# Patient Record
Sex: Male | Born: 1955 | Race: White | Hispanic: No | Marital: Married | Smoking: Current every day smoker
Health system: Southern US, Community
[De-identification: ages and names within clinical notes are randomized; demographics above are authoritative.]

## PROBLEM LIST (undated history)

## (undated) DIAGNOSIS — Z8709 Personal history of other diseases of the respiratory system: Secondary | ICD-10-CM

## (undated) DIAGNOSIS — I10 Essential (primary) hypertension: Secondary | ICD-10-CM

## (undated) DIAGNOSIS — Z8719 Personal history of other diseases of the digestive system: Secondary | ICD-10-CM

## (undated) DIAGNOSIS — F419 Anxiety disorder, unspecified: Secondary | ICD-10-CM

## (undated) DIAGNOSIS — I2510 Atherosclerotic heart disease of native coronary artery without angina pectoris: Secondary | ICD-10-CM

## (undated) DIAGNOSIS — M1990 Unspecified osteoarthritis, unspecified site: Secondary | ICD-10-CM

## (undated) DIAGNOSIS — F32A Depression, unspecified: Secondary | ICD-10-CM

## (undated) DIAGNOSIS — E782 Mixed hyperlipidemia: Secondary | ICD-10-CM

## (undated) DIAGNOSIS — F329 Major depressive disorder, single episode, unspecified: Secondary | ICD-10-CM

## (undated) DIAGNOSIS — G894 Chronic pain syndrome: Secondary | ICD-10-CM

## (undated) DIAGNOSIS — M2550 Pain in unspecified joint: Secondary | ICD-10-CM

## (undated) DIAGNOSIS — K219 Gastro-esophageal reflux disease without esophagitis: Secondary | ICD-10-CM

## (undated) HISTORY — DX: Atherosclerotic heart disease of native coronary artery without angina pectoris: I25.10

## (undated) HISTORY — PX: APPENDECTOMY: SHX54

## (undated) HISTORY — PX: CERVICAL SPINE SURGERY: SHX589

## (undated) HISTORY — DX: Depression, unspecified: F32.A

## (undated) HISTORY — DX: Chronic pain syndrome: G89.4

## (undated) HISTORY — DX: Mixed hyperlipidemia: E78.2

## (undated) HISTORY — DX: Essential (primary) hypertension: I10

## (undated) HISTORY — PX: ELBOW SURGERY: SHX618

## (undated) HISTORY — PX: HAND SURGERY: SHX662

## (undated) HISTORY — PX: CARPAL TUNNEL RELEASE: SHX101

## (undated) HISTORY — DX: Gastro-esophageal reflux disease without esophagitis: K21.9

## (undated) HISTORY — DX: Major depressive disorder, single episode, unspecified: F32.9

## (undated) HISTORY — PX: COLONOSCOPY: SHX174

## (undated) HISTORY — PX: OTHER SURGICAL HISTORY: SHX169

---

## 2003-08-10 ENCOUNTER — Ambulatory Visit (HOSPITAL_COMMUNITY): Admission: RE | Admit: 2003-08-10 | Discharge: 2003-08-10 | Payer: Self-pay | Admitting: Family Medicine

## 2003-08-10 ENCOUNTER — Encounter: Payer: Self-pay | Admitting: Family Medicine

## 2004-06-18 ENCOUNTER — Ambulatory Visit (HOSPITAL_COMMUNITY): Admission: RE | Admit: 2004-06-18 | Discharge: 2004-06-18 | Payer: Self-pay | Admitting: Orthopedic Surgery

## 2004-07-03 ENCOUNTER — Ambulatory Visit (HOSPITAL_COMMUNITY): Admission: RE | Admit: 2004-07-03 | Discharge: 2004-07-03 | Payer: Self-pay | Admitting: Neurosurgery

## 2004-10-09 ENCOUNTER — Ambulatory Visit: Payer: Self-pay | Admitting: Family Medicine

## 2004-10-10 ENCOUNTER — Inpatient Hospital Stay (HOSPITAL_COMMUNITY): Admission: RE | Admit: 2004-10-10 | Discharge: 2004-10-12 | Payer: Self-pay | Admitting: Orthopaedic Surgery

## 2005-07-22 ENCOUNTER — Ambulatory Visit: Payer: Self-pay | Admitting: *Deleted

## 2005-07-22 ENCOUNTER — Ambulatory Visit: Payer: Self-pay | Admitting: Family Medicine

## 2005-07-22 ENCOUNTER — Inpatient Hospital Stay (HOSPITAL_COMMUNITY): Admission: EM | Admit: 2005-07-22 | Discharge: 2005-07-25 | Payer: Self-pay | Admitting: Emergency Medicine

## 2005-07-23 ENCOUNTER — Encounter: Payer: Self-pay | Admitting: Cardiology

## 2005-07-25 ENCOUNTER — Encounter: Payer: Self-pay | Admitting: Cardiology

## 2005-08-11 ENCOUNTER — Ambulatory Visit: Payer: Self-pay | Admitting: Family Medicine

## 2005-09-25 ENCOUNTER — Ambulatory Visit: Payer: Self-pay | Admitting: Family Medicine

## 2007-02-02 ENCOUNTER — Ambulatory Visit: Payer: Self-pay | Admitting: Family Medicine

## 2007-02-09 ENCOUNTER — Ambulatory Visit: Payer: Self-pay | Admitting: Family Medicine

## 2008-05-17 ENCOUNTER — Encounter: Payer: Self-pay | Admitting: Cardiology

## 2008-08-08 ENCOUNTER — Encounter: Payer: Self-pay | Admitting: Cardiology

## 2008-08-10 ENCOUNTER — Encounter: Payer: Self-pay | Admitting: Cardiology

## 2009-08-06 ENCOUNTER — Encounter: Payer: Self-pay | Admitting: Cardiology

## 2009-08-07 ENCOUNTER — Encounter: Payer: Self-pay | Admitting: Cardiology

## 2009-09-25 ENCOUNTER — Encounter: Payer: Self-pay | Admitting: Cardiology

## 2009-10-02 ENCOUNTER — Encounter: Payer: Self-pay | Admitting: Cardiology

## 2009-10-11 ENCOUNTER — Encounter: Payer: Self-pay | Admitting: Cardiology

## 2009-10-11 DIAGNOSIS — F329 Major depressive disorder, single episode, unspecified: Secondary | ICD-10-CM

## 2009-10-11 DIAGNOSIS — I10 Essential (primary) hypertension: Secondary | ICD-10-CM | POA: Insufficient documentation

## 2009-10-11 DIAGNOSIS — R5381 Other malaise: Secondary | ICD-10-CM

## 2009-10-11 DIAGNOSIS — R5383 Other fatigue: Secondary | ICD-10-CM

## 2009-10-11 DIAGNOSIS — E669 Obesity, unspecified: Secondary | ICD-10-CM | POA: Insufficient documentation

## 2009-10-11 DIAGNOSIS — R0609 Other forms of dyspnea: Secondary | ICD-10-CM | POA: Insufficient documentation

## 2009-10-11 DIAGNOSIS — R0602 Shortness of breath: Secondary | ICD-10-CM

## 2009-10-11 DIAGNOSIS — R079 Chest pain, unspecified: Secondary | ICD-10-CM

## 2009-10-11 DIAGNOSIS — G894 Chronic pain syndrome: Secondary | ICD-10-CM

## 2009-10-11 DIAGNOSIS — K219 Gastro-esophageal reflux disease without esophagitis: Secondary | ICD-10-CM

## 2009-10-11 DIAGNOSIS — E785 Hyperlipidemia, unspecified: Secondary | ICD-10-CM

## 2009-10-15 ENCOUNTER — Ambulatory Visit: Payer: Self-pay | Admitting: Cardiology

## 2009-10-15 ENCOUNTER — Encounter (INDEPENDENT_AMBULATORY_CARE_PROVIDER_SITE_OTHER): Payer: Self-pay | Admitting: *Deleted

## 2009-10-15 DIAGNOSIS — F17200 Nicotine dependence, unspecified, uncomplicated: Secondary | ICD-10-CM

## 2009-10-18 ENCOUNTER — Ambulatory Visit: Payer: Self-pay | Admitting: Cardiology

## 2009-10-18 ENCOUNTER — Encounter: Payer: Self-pay | Admitting: Cardiology

## 2009-11-20 ENCOUNTER — Encounter: Payer: Self-pay | Admitting: Cardiology

## 2009-12-01 HISTORY — PX: CORONARY ARTERY BYPASS GRAFT: SHX141

## 2009-12-13 ENCOUNTER — Encounter (INDEPENDENT_AMBULATORY_CARE_PROVIDER_SITE_OTHER): Payer: Self-pay | Admitting: *Deleted

## 2009-12-13 ENCOUNTER — Encounter: Payer: Self-pay | Admitting: Physician Assistant

## 2009-12-13 ENCOUNTER — Ambulatory Visit: Payer: Self-pay | Admitting: Cardiology

## 2009-12-14 ENCOUNTER — Encounter (INDEPENDENT_AMBULATORY_CARE_PROVIDER_SITE_OTHER): Payer: Self-pay | Admitting: *Deleted

## 2009-12-17 ENCOUNTER — Ambulatory Visit: Payer: Self-pay | Admitting: Cardiology

## 2009-12-17 ENCOUNTER — Inpatient Hospital Stay (HOSPITAL_BASED_OUTPATIENT_CLINIC_OR_DEPARTMENT_OTHER): Admission: RE | Admit: 2009-12-17 | Discharge: 2009-12-17 | Payer: Self-pay | Admitting: Cardiology

## 2009-12-18 ENCOUNTER — Telehealth (INDEPENDENT_AMBULATORY_CARE_PROVIDER_SITE_OTHER): Payer: Self-pay | Admitting: *Deleted

## 2009-12-18 ENCOUNTER — Ambulatory Visit: Payer: Self-pay | Admitting: Surgery

## 2009-12-18 ENCOUNTER — Encounter: Payer: Self-pay | Admitting: Cardiology

## 2009-12-20 ENCOUNTER — Encounter: Payer: Self-pay | Admitting: Surgery

## 2009-12-20 ENCOUNTER — Encounter: Payer: Self-pay | Admitting: Cardiology

## 2009-12-24 ENCOUNTER — Inpatient Hospital Stay (HOSPITAL_COMMUNITY): Admission: RE | Admit: 2009-12-24 | Discharge: 2009-12-28 | Payer: Self-pay | Admitting: Surgery

## 2009-12-24 ENCOUNTER — Ambulatory Visit: Payer: Self-pay | Admitting: Surgery

## 2009-12-24 ENCOUNTER — Encounter: Payer: Self-pay | Admitting: Cardiology

## 2009-12-24 DIAGNOSIS — Z951 Presence of aortocoronary bypass graft: Secondary | ICD-10-CM

## 2009-12-25 ENCOUNTER — Encounter: Payer: Self-pay | Admitting: Cardiology

## 2009-12-26 ENCOUNTER — Encounter: Payer: Self-pay | Admitting: Cardiology

## 2009-12-31 ENCOUNTER — Telehealth: Payer: Self-pay | Admitting: Cardiology

## 2010-01-11 ENCOUNTER — Encounter: Payer: Self-pay | Admitting: Physician Assistant

## 2010-01-15 ENCOUNTER — Ambulatory Visit: Payer: Self-pay | Admitting: Cardiology

## 2010-01-15 DIAGNOSIS — M7989 Other specified soft tissue disorders: Secondary | ICD-10-CM

## 2010-01-17 ENCOUNTER — Telehealth (INDEPENDENT_AMBULATORY_CARE_PROVIDER_SITE_OTHER): Payer: Self-pay | Admitting: *Deleted

## 2010-01-18 ENCOUNTER — Telehealth (INDEPENDENT_AMBULATORY_CARE_PROVIDER_SITE_OTHER): Payer: Self-pay | Admitting: *Deleted

## 2010-01-18 ENCOUNTER — Encounter: Payer: Self-pay | Admitting: Cardiology

## 2010-01-22 ENCOUNTER — Ambulatory Visit: Payer: Self-pay | Admitting: Surgery

## 2010-01-22 ENCOUNTER — Encounter: Admission: RE | Admit: 2010-01-22 | Discharge: 2010-01-22 | Payer: Self-pay | Admitting: Surgery

## 2010-01-23 ENCOUNTER — Telehealth (INDEPENDENT_AMBULATORY_CARE_PROVIDER_SITE_OTHER): Payer: Self-pay | Admitting: *Deleted

## 2010-01-23 ENCOUNTER — Encounter: Payer: Self-pay | Admitting: Surgery

## 2010-01-23 ENCOUNTER — Encounter: Payer: Self-pay | Admitting: Cardiology

## 2010-01-24 ENCOUNTER — Encounter: Payer: Self-pay | Admitting: Cardiology

## 2010-02-19 ENCOUNTER — Telehealth (INDEPENDENT_AMBULATORY_CARE_PROVIDER_SITE_OTHER): Payer: Self-pay | Admitting: *Deleted

## 2010-02-21 ENCOUNTER — Encounter: Payer: Self-pay | Admitting: Cardiology

## 2010-03-19 ENCOUNTER — Ambulatory Visit: Payer: Self-pay | Admitting: Cardiology

## 2010-03-19 DIAGNOSIS — Z87448 Personal history of other diseases of urinary system: Secondary | ICD-10-CM

## 2010-04-25 ENCOUNTER — Encounter: Payer: Self-pay | Admitting: Cardiology

## 2010-06-25 ENCOUNTER — Encounter: Payer: Self-pay | Admitting: Cardiology

## 2010-09-03 IMAGING — CR DG CHEST 1V PORT
1 series · 1 of 1 positions shown · non-contrast
Comparison: 12/24/2009

CLINICAL DATA: Postop day one, status post CABG.  Coronary artery
disease

PORTABLE CHEST - 1 VIEW

[AP]
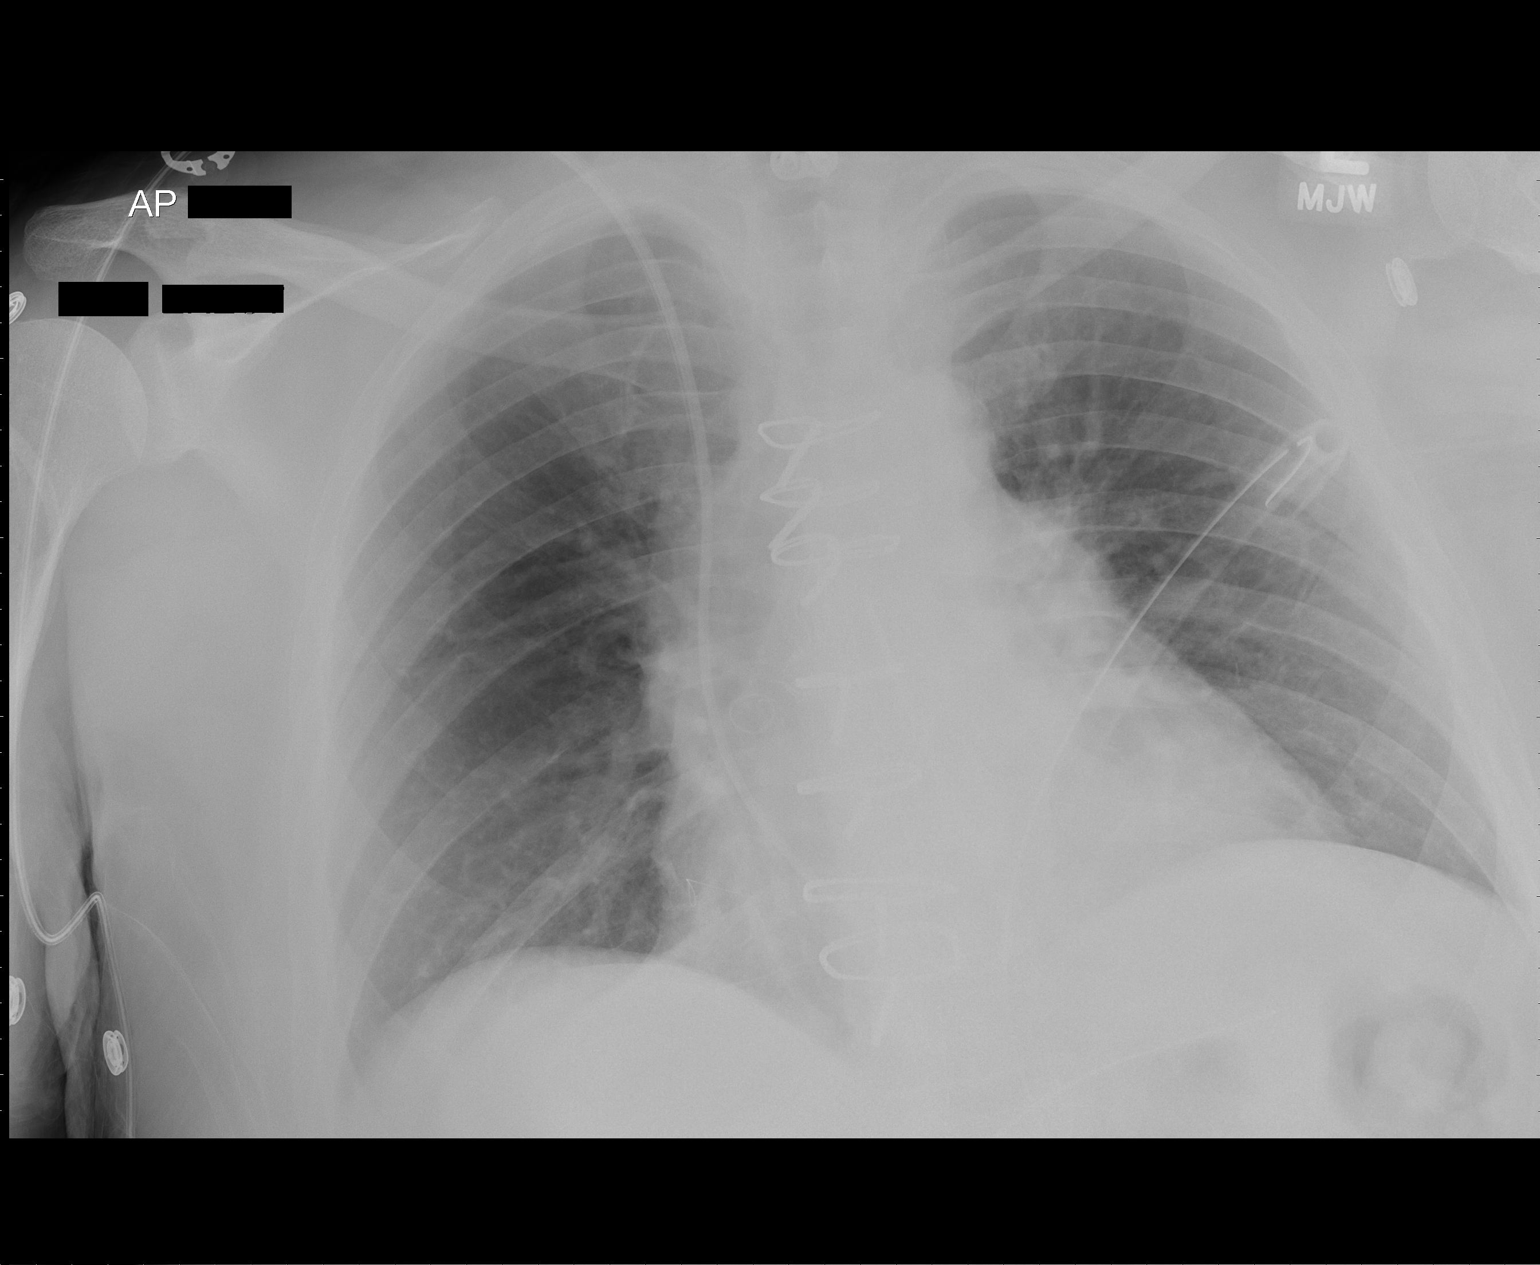

[1 of 1 positions shown; findings below may reference images not displayed]

FINDINGS: Endotracheal and nasogastric tubes have been removed.
Mediastinal drain, left-sided chest tube, and Swan-Ganz catheter
remain in place.  The Swan-Ganz catheter tip projects of the main
pulmonary artery, as before.

Borderline cardiomegaly noted.  The lungs appear clear.  Mildly low
lung volumes are present.
IMPRESSION: 1.  Postop day one status post CABG, with removal of the
nasogastric and endotracheal tubes.  No specific complicating
feature is identified.

## 2010-09-04 IMAGING — CR DG CHEST 2V
2 series · 2 of 2 positions shown · non-contrast
Comparison: Portable chest x-ray of 12/25/2009

CLINICAL DATA: Chest soreness post CABG

CHEST - 2 VIEW

[w chest pa]
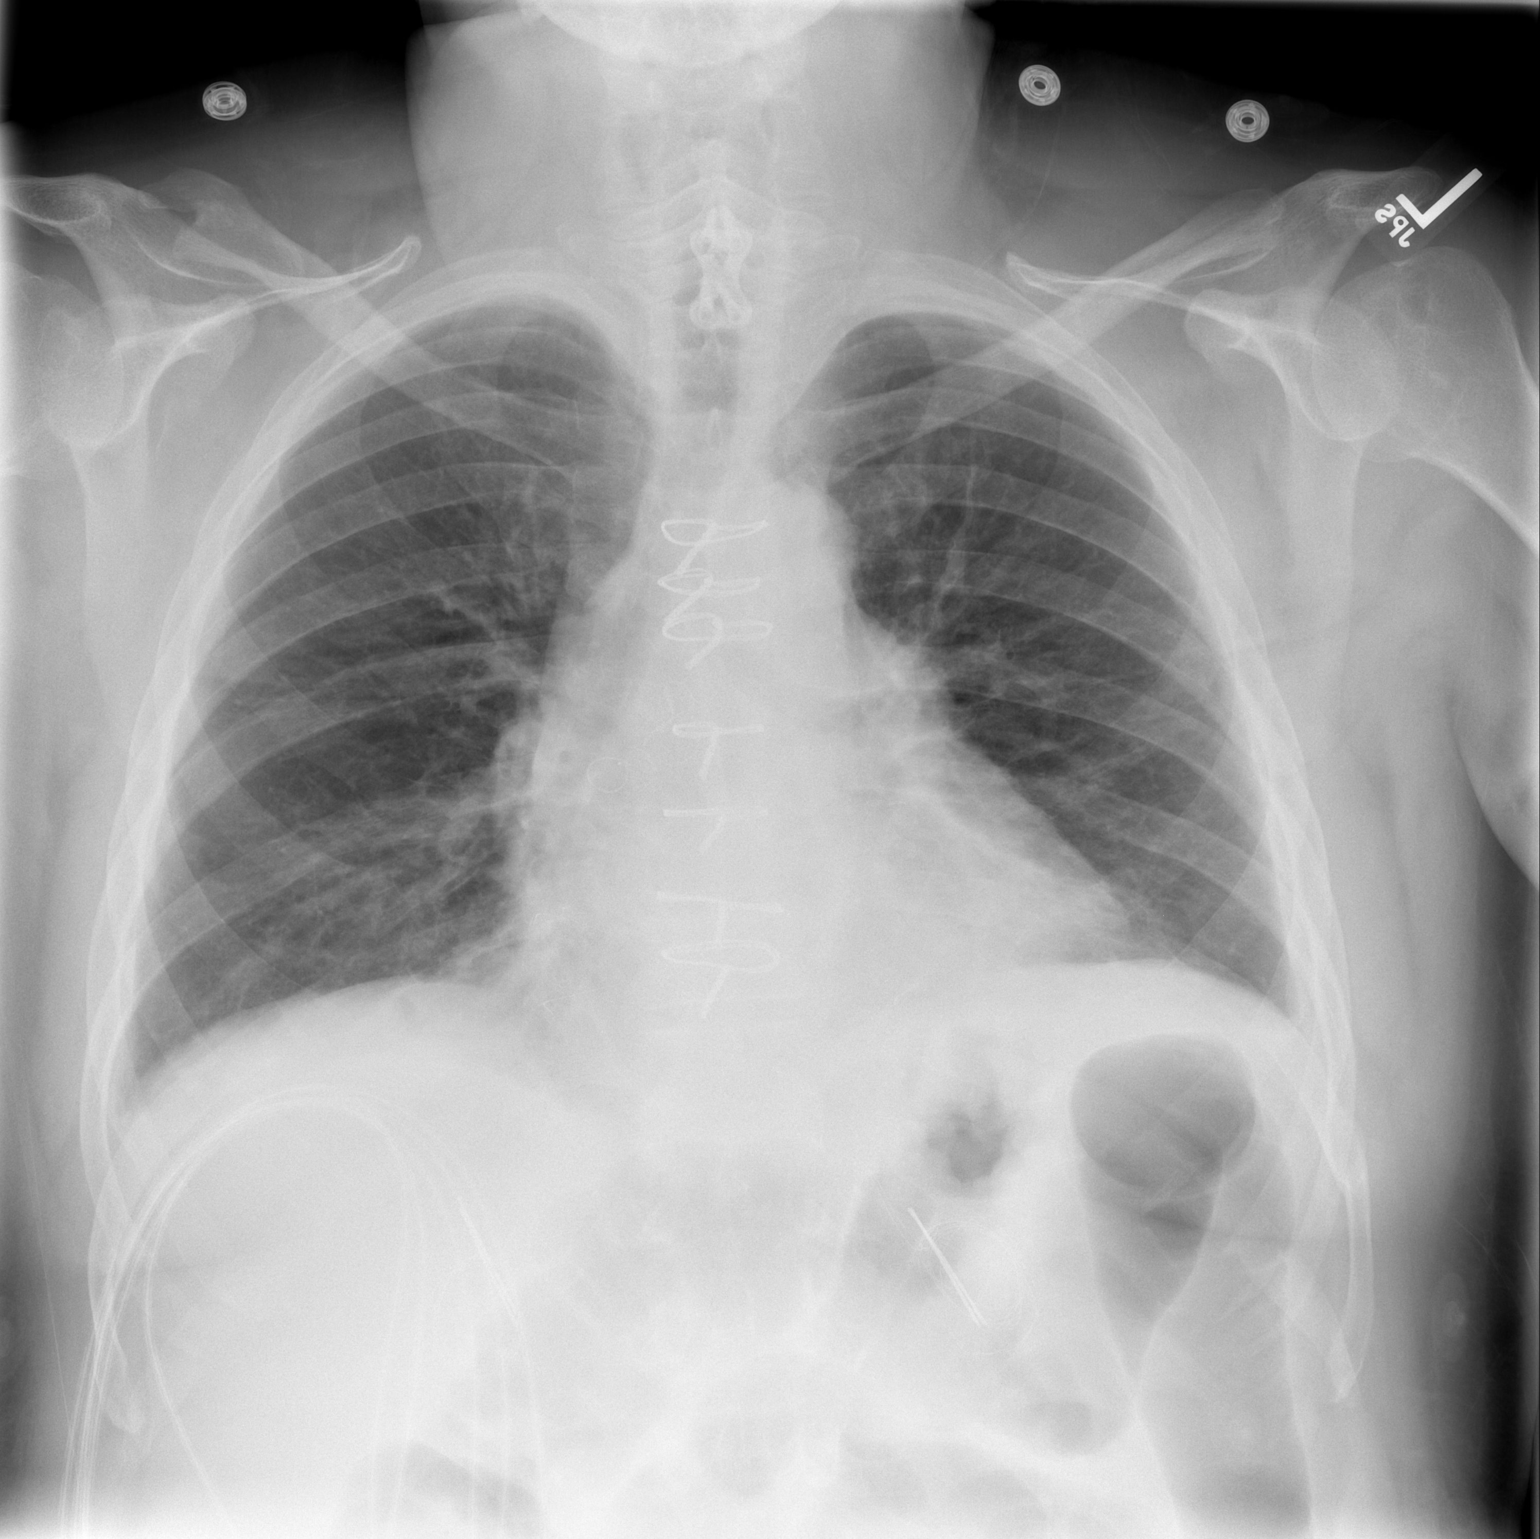

[w chest lat]
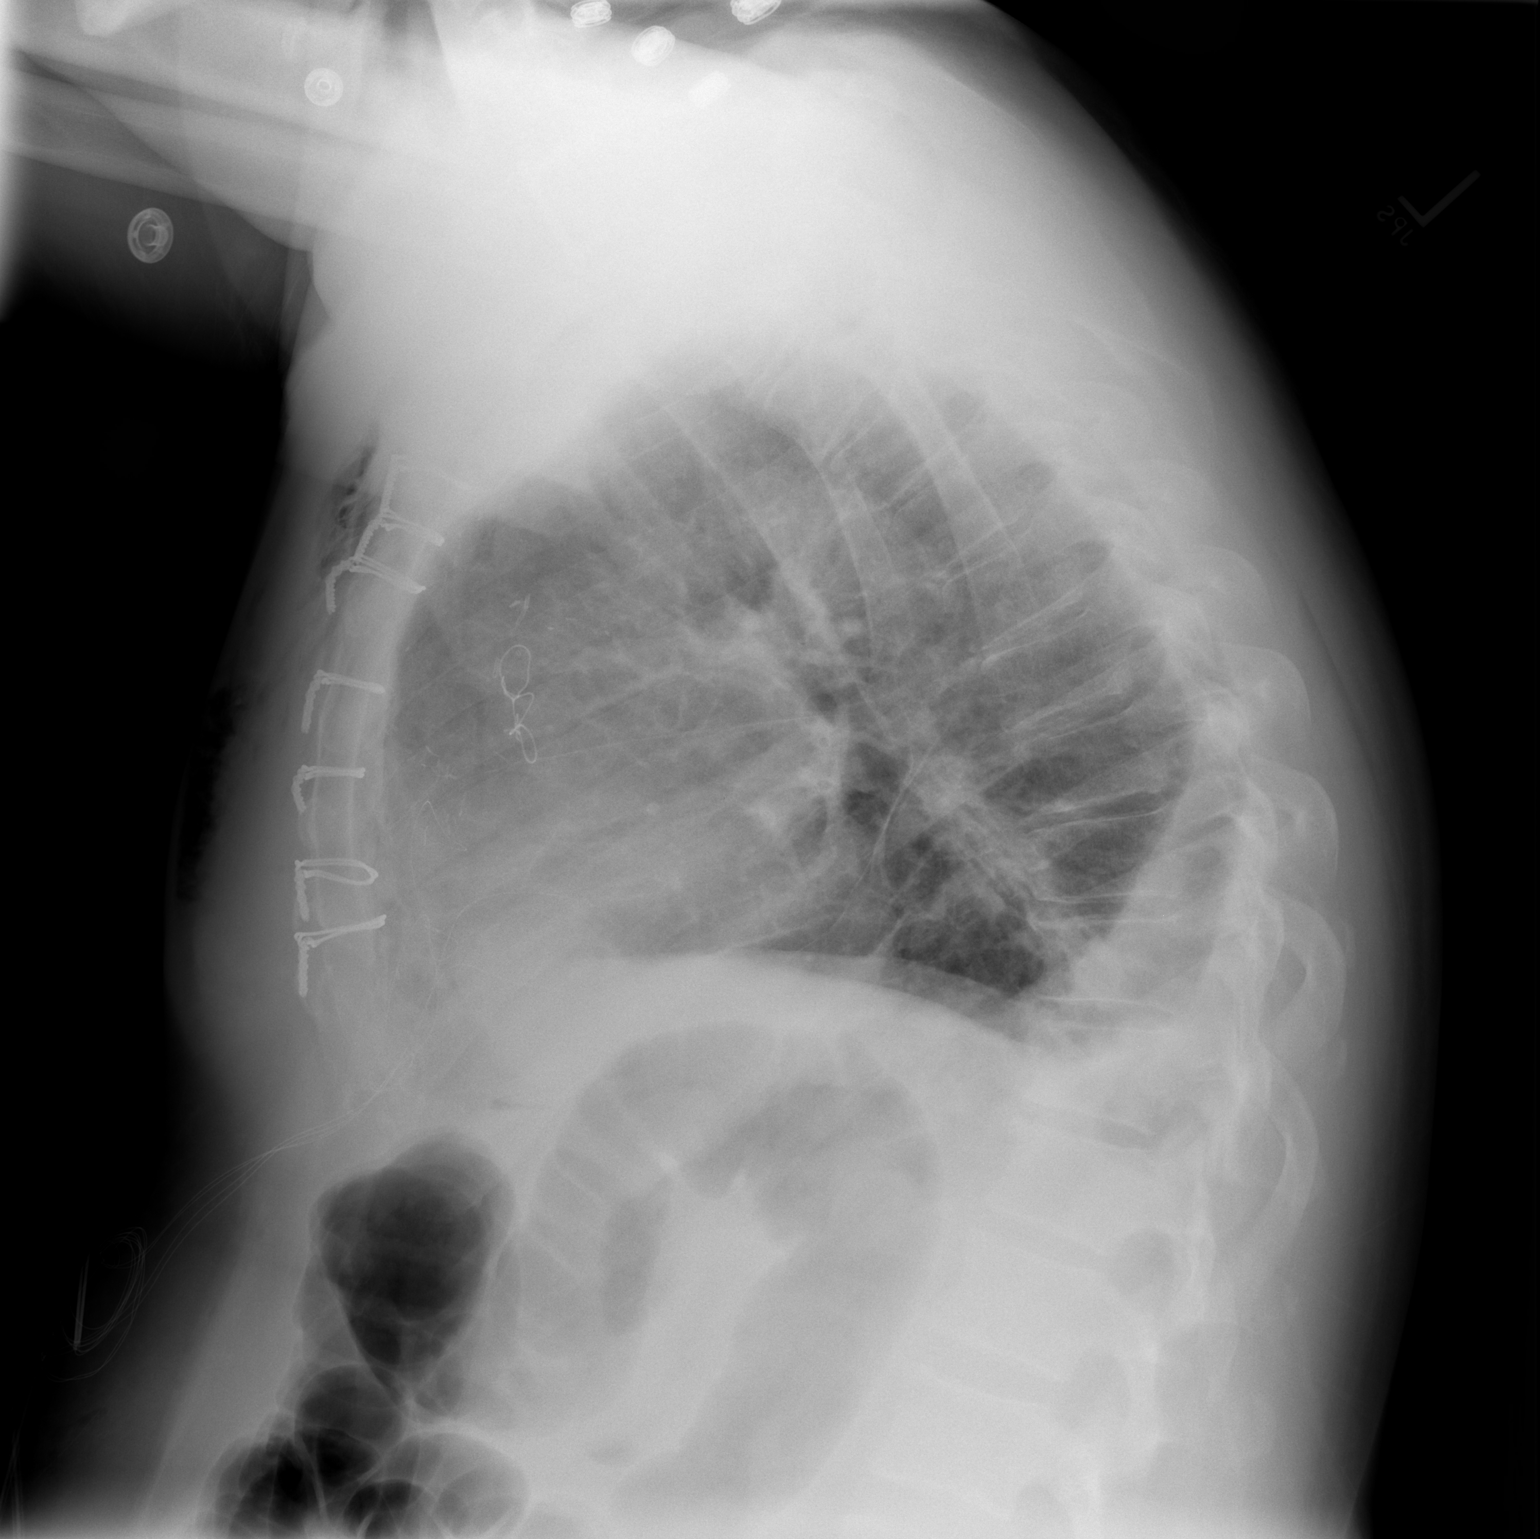

[2 of 2 positions shown; findings below may reference images not displayed]

FINDINGS: There is mild basilar atelectasis present with small
pleural effusions present.  The Swan-Ganz catheter and left chest
tube have been removed and no pneumothorax is seen.  Cardiomegaly
is stable.  Median sternotomy sutures are noted.
IMPRESSION: Left chest tube and Swan-Ganz catheter removed.  Mild basilar
atelectasis remains with small effusions.

## 2010-09-25 ENCOUNTER — Encounter: Payer: Self-pay | Admitting: Cardiology

## 2010-12-16 ENCOUNTER — Ambulatory Visit
Admission: RE | Admit: 2010-12-16 | Discharge: 2010-12-16 | Payer: Self-pay | Source: Home / Self Care | Attending: Cardiology | Admitting: Cardiology

## 2010-12-31 NOTE — Consult Note (Signed)
Summary: [facility] Cardiac Rehab Referral   [facility] Cardiac Rehab Referral   Imported By: Roderic Ovens 02/11/2010 14:33:13  _____________________________________________________________________  External Attachment:    Type:   Image     Comment:   External Document

## 2010-12-31 NOTE — Assessment & Plan Note (Signed)
Summary: 2 mo fu -srs   Visit Type:  Follow-up Primary Provider:  Art Buff, MD  CC:  CAD.  History of Present Illness: The patient returns for followup. Since I last saw him he has done relatively well. Pain that he was having in his right leg related to his endovascular harvest is improving and the swelling has gone down. He did see Dr. Laneta Simmers in followup. He is participating in cardiac rehabilitation. His blood pressure was elevated and he was recently started on hydrochlorothiazide and this is improved.  He doesn't have some atypical fleeting chest discomfort not inducible with activity.  Preventive Screening-Counseling & Management  Alcohol-Tobacco     Smoking Status: quit     Year Started: 1962     Year Quit: 12/23/2009  Current Medications (verified): 1)  Aspirin 81 Mg Tbec (Aspirin) .... Take One Tablet By Mouth Daily 2)  Prilosec 20 Mg Cpdr (Omeprazole) .... Take 1 Tablet By Mouth Two Times A Day 3)  Zymaxid 0.5 % Soln (Gatifloxacin) .... Use 1 Gtt Four Times Per Day in (L) Eye When Receiving Injections 4)  Avastin 100 Mg/56ml Soln (Bevacizumab) .... Inject in (L) Eye Once Per Month. 5)  Pravastatin Sodium 40 Mg Tabs (Pravastatin Sodium) .... Take One Tablet By Mouth Daily At Bedtime 6)  Flexeril 10 Mg Tabs (Cyclobenzaprine Hcl) .... As Needed 7)  Alprazolam 0.25 Mg Tabs (Alprazolam) .... As Needed 8)  Hydrochlorothiazide 12.5 Mg Tabs (Hydrochlorothiazide) .... Take One Tablet By Mouth Daily. 9)  Lisinopril 20 Mg Tabs (Lisinopril) .... Take One Tablet By Mouth Daily  Allergies: 1)  Codeine  Comments:  Nurse/Medical Assistant: The patient's medications were reviewed with the patient and were updated in the Medication List. Pt brought medication bottles to office visit.  Cyril Loosen, RN, BSN (March 19, 2010 11:30 AM)  Past History:  Past Medical History: Reviewed history from 01/15/2010 and no changes required. SHORTNESS OF BREATH (ICD-786.05) CHEST PAIN  UNSPECIFIED (ICD-786.50) DEPRESSION (ICD-311) GERD (ICD-530.81) OBESITY, UNSPECIFIED (ICD-278.00) OTHER MALAISE AND FATIGUE (ICD-780.79) CHRONIC PAIN SYNDROME (ICD-338.4) HYPERTENSION (ICD-401.9) (recent) HYPERLIPIDEMIA (ICD-272.4) (years) ENCOUNTER FOR LONG-TERM USE OF OTHER MEDICATIONS (ICD-V58.69) Tobacco abuse CAD  Past Surgical History: Reviewed history from 01/15/2010 and no changes required. C Spine with fusions x 3 Right shoulder surgery Left shoulder surgery Appendectomy Left and right carpel tunnel syndrome Left elbow surgery  CABG (left internal mammary artery graft to the left anterior descending coronary artery, with a saphenous vein graft to the diagonal branch of the LAD, a saphenous vein graft to the second obtuse marginal branch of the left circumflex coronary artery, and a sequential saphenous vein graft to the acute marginal and distal right coronary artery.)  Social History: Smoking Status:  quit  Review of Systems       As stated in the HPI and negative for all other systems.   Vital Signs:  Patient profile:   55 year old male Height:      70 inches Weight:      226 pounds Pulse rate:   62 / minute BP sitting:   118 / 83  (left arm) Cuff size:   large  Vitals Entered By: Cyril Loosen, RN, BSN (March 19, 2010 11:26 AM) CC: CAD Comments Follow up appt. Dr. Barbara Cower added another BP med per pt.   Physical Exam  General:  Well developed, well nourished, in no acute distress. Head:  normocephalic and atraumatic Eyes:  PERRLA/EOM intact; conjunctiva and lids normal. Mouth:  Oral mucosa normal.  Neck:  Neck supple, no JVD. No masses, thyromegaly or abnormal cervical nodes. Chest Wall:  Well-healed sternotomy scar Lungs:  Clear bilaterally to auscultation and percussion. Abdomen:  Bowel sounds positive; abdomen soft and non-tender without masses, organomegaly, or hernias noted. No hepatosplenomegaly. Msk:  Back normal, normal gait. Muscle  strength and tone normal. Extremities:  mild right lower extremity edema improved compared to previous Neurologic:  Alert and oriented x 3. Skin:  Intact without lesions or rashes. Psych:  Normal affect.   Detailed Cardiovascular Exam  Neck    Carotids: Carotids full and equal bilaterally without bruits.      Neck Veins: Normal, no JVD.    Heart    Inspection: no deformities or lifts noted.      Palpation: normal PMI with no thrills palpable.      Auscultation: regular rate and rhythm, S1, S2 without murmurs, rubs, gallops, or clicks.    Vascular    Abdominal Aorta: no palpable masses, pulsations, or audible bruits.      Femoral Pulses: normal femoral pulses bilaterally.      Radial Pulses: normal radial pulses bilaterally.      Peripheral Circulation: no clubbing, cyanosis, or edema noted with normal capillary refill.     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 No further cardiovascular testing is suggested. He will continue with risk reduction.  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  Problem # 3:  OBESITY, UNSPECIFIED (ICD-278.00) He understands the need to lose weight with diet and exercise.  Problem # 4:  HYPERLIPIDEMIA (ICD-272.4) I reviewed labs done last month with an HDL of 32 and LDL of 96. I hope that this will improve further with increased exercise but would not change his meds.  Patient Instructions: 1)  Your physician has recommended you make the following change in your medication: added viagra 50mg  to be used as needed. Your prescription has been sent to your pharmacy. 2)  Your physician wants you to follow-up in:6 months. You will receive a reminder letter in the mail about two months in advance. If you don't  receive a letter, please call our office to schedule the follow-up appointment. Prescriptions: VIAGRA 50 MG TABS (SILDENAFIL CITRATE) Take 1 tablet by mouth once a day as needed. (DON'T USE IN COMBO WITH NITROGLYCERIN)  #20 x 2   Entered by:   Carlye Grippe   Authorized by:   Rollene Rotunda, MD, [facility]   Signed by:   Carlye Grippe on 03/19/2010   Method used:   Electronically to        [facility] (retail)       [address]       [place], [place]  [address]       Ph: [phone]       Fax: [phone]   RxID:   [id number]

## 2010-12-31 NOTE — Cardiovascular Report (Signed)
Summary: Cardiac Cath Order  Cardiac Cath Order   Imported By: Cyril Loosen, RN, BSN 12/14/2009 13:07:31  _____________________________________________________________________  External Attachment:    Type:   Image     Comment:   External Document

## 2010-12-31 NOTE — Letter (Signed)
Summary: Cardiac Cath Instructions - [facility]  [facility] [address]   [place], [place] [address]   Phone: [phone]  Fax: [phone]     12/13/2009 MRN: [id number]  Brandon Pratt [address] [place], [place]  [address]  Dear [name],   You are scheduled for a Cardiac Catheterization on Monday, January 17th with Dr. Antoine Poche.  Please arrive to the 1st floor of the Heart and Vascular Center at [facility] at 9:30 am on the day of your procedure. Please do not arrive before 6:30 a.m. Call the Heart and Vascular Center at [phone] if you are unable to make your appointmnet. The Code to get into the parking garage under the building is 0090. Take the elevators to the 1st floor. You must have someone to drive you home. Someone must be with you for the first 24 hours after you arrive home. Please wear clothes that are easy to get on and off and wear slip-on shoes. Do not eat or drink after midnight except water with your medications that morning. Bring all your medications and current insurance cards with you.  _X__ DO NOT take these medications before your procedure: Lisinopril/HCTZ-hold morning of cath.  _X__ Make sure you take your aspirin. Take 4 of your 81mg  Aspirin.  _X__ You may take all of your remaining medications with water that morning.  ___ DO NOT take ANY medications before your procedure.  ___ Pre-med instructions:  ________________________________________________________________________  The usual length of stay after your procedure is 2 to 3 hours. This can vary.  If you have any questions, please call the office at the number listed above.  Cyril Loosen, RN, BSN           Directions to the [facility] Center [facility]  Please Note : Park in [place] under the building not the parking deck.  From [employer]: Turn onto [address] Left onto [address] (1st stoplight) Right at the brick  entrance to the hospital ([address]) Bear to the right and you will see a blue sign "Heart and Vascular Center" Parking garage is a sharp right'to get through the gate out in the code _0090______. Once you park, take the elevator to the first floor. Please do not arrive before 0630am. The building will be dark before that time.   From [address] Turn onto [address] Turn left into the brick entrance to the hospital (Main circle drive) Bear to the right and you will see a blue sign "Heart [facility] V[facility] Center" Parking garage is a sharp right, to get thru the gate put in the code 0090____. Once you park, take the elevator to the first floor. Please do not arrive before 0630am. The building will be dark before that time

## 2010-12-31 NOTE — Progress Notes (Signed)
Summary: venous doppler  Phone Note Call from Patient Call back at [phone]   Caller: [facility] Call For: doctor Summary of Call: abnormal doppler result reported was that most of the greater saffines was removed from cabbage and the  branches have thrombis and also the distal in lower leg also has thrombis. Initial call taken by: Carlye Grippe,  January 17, 2010 4:32 PM  Follow-up for Phone Call        MD informed and said all are superficial and paitient is to use warm compresses and elevation to extremities. Follow-up by: Carlye Grippe,  January 18, 2010 4:10 PM

## 2010-12-31 NOTE — Miscellaneous (Signed)
Summary: Rehab Report/ CARDIAC [facility] REFERRAL  Rehab Report/ CARDIAC REHAB REFERRAL   Imported By: Dorise Hiss 01/11/2010 12:28:27  _____________________________________________________________________  External Attachment:    Type:   Image     Comment:   External Document

## 2010-12-31 NOTE — Progress Notes (Signed)
Summary: PHONE; POST OP CABG  Phone Note Call from Patient Call back at Home Phone [phone]   Caller: Spouse Summary of Call: D/C [facility] 12-28-2009 was told to come to office in 2 wks for post CABG fu you are here on the 01/15/2010 but have a full schedule. Can I add this patient on to your schedule? He has fu appt With Dr. Laneta Simmers on 01/22/2010 and needs to have fu with you before this appt. Thank you. Initial call taken by: Zachary George,  December 31, 2009 9:23 AM  Follow-up for Phone Call        OK to schedule as an add on. Follow-up by: Rollene Rotunda, MD, [facility],  December 31, 2009 1:52 PM

## 2010-12-31 NOTE — Assessment & Plan Note (Signed)
Summary: per Pratt dr.bartle wanted Pratt seen with in   Visit Type:  Follow-up Primary Provider:  Art Buff, MD   History of Present Illness: Brandon Pratt returns for followup after recent CABG. He has done well since that time. He has been feeling a little walking but is to start cardiac rehabilitation tomorrow. He is having still some very mild incisional chest pain and takes a rare Dilaudid.  He has had some swelling and firm induration in his right thigh and calf where he had his vein harvest. He has had a little bruising in that ankle. He has had no fevers or chills. He has no chest pressure, neck or arm discomfort.  He denies any shortness of breath, PND or orthopnea. He has stopped smoking.  Preventive Screening-Counseling & Management  Alcohol-Tobacco     Smoking Status: quit < 6 months     Year Started: 1962     Year Quit: 12/23/2009  Current Medications (verified): 1)  Aspirin 81 Mg Tbec (Aspirin) .... Take One Tablet By Mouth Daily 2)  Prilosec 20 Mg Cpdr (Omeprazole) .... Take 1 Tablet By Mouth Once A Day 3)  Zymaxid 0.5 % Soln (Gatifloxacin) .... Use 1 Gtt Four Times Per Day in (L) Eye When Receiving Injections 4)  Avastin 100 Mg/52ml Soln (Bevacizumab) .... Inject in (L) Eye Once Per Month. 5)  Pravastatin Sodium 40 Mg Tabs (Pravastatin Sodium) .... Take One Tablet By Mouth Daily At Bedtime 6)  Flexeril 10 Mg Tabs (Cyclobenzaprine Hcl) .... As Needed 7)  Metoprolol Tartrate 25 Mg Tabs (Metoprolol Tartrate) .... Take 1/2  Tablet By Mouth Twice A Day 8)  Alprazolam 0.25 Mg Tabs (Alprazolam) .... As Needed 9)  Dilaudid 2 Mg Tabs (Hydromorphone Hcl) .... As Needed  Allergies: 1)  Codeine  Comments:  Nurse/Medical Assistant: Brandon Pratt's medications were reviewed with Brandon Pratt and were updated in Brandon Medication List. Pt brought medication bottles to office visit. Cyril Loosen, RN, BSN (January 15, 2010 12:15 PM)  Past History:  Past Medical  History: SHORTNESS OF BREATH (ICD-786.05) CHEST PAIN UNSPECIFIED (ICD-786.50) DEPRESSION (ICD-311) GERD (ICD-530.81) OBESITY, UNSPECIFIED (ICD-278.00) OTHER MALAISE AND FATIGUE (ICD-780.79) CHRONIC PAIN SYNDROME (ICD-338.4) HYPERTENSION (ICD-401.9) (recent) HYPERLIPIDEMIA (ICD-272.4) (years) ENCOUNTER FOR LONG-TERM USE OF OTHER MEDICATIONS (ICD-V58.69) Tobacco abuse CAD  Past Surgical History: C Spine with fusions x 3 Right shoulder surgery Left shoulder surgery Appendectomy Left and right carpel tunnel syndrome Left elbow surgery  CABG (left internal mammary artery graft to Brandon left anterior descending coronary artery, with a saphenous vein graft to Brandon diagonal branch of Brandon LAD, a saphenous vein graft to Brandon second obtuse marginal branch of Brandon left circumflex coronary artery, and a sequential saphenous vein graft to Brandon acute marginal and distal right coronary artery.)   Social History: Smoking Status:  quit < 6 months  Review of Systems       As stated in Brandon HPI and negative for all other systems.   Vital Signs:  Pratt profile:   55 year old male Height:      70 inches Weight:      213 pounds Pulse rate:   87 / minute BP sitting:   130 / 90  (left arm) Cuff size:   large  Vitals Entered By: Cyril Loosen, RN, BSN (January 15, 2010 12:07 PM) Comments Follow up visit after CABG x5. Pt states since surgery he can't lay flat because he feels like he can' t breathe. He states he does fine if he  sleeps in Brandon recliner.   Physical Exam  General:  Well developed, well nourished, in no acute distress. Head:  normocephalic and atraumatic Eyes:  PERRLA/EOM intact; conjunctiva and lids normal. Mouth:  Oral mucosa normal. Neck:  Neck supple, no JVD. No masses, thyromegaly or abnormal cervical nodes. Chest Wall:  Well-healed sternotomy scar Lungs:  Clear bilaterally to auscultation and percussion. Abdomen:  Bowel sounds positive; abdomen soft and non-tender  without masses, organomegaly, or hernias noted. No hepatosplenomegaly. Msk:  Back normal, normal gait. Muscle strength and tone normal. Extremities:  this firm induration running Brandon course of Brandon saphenous vein graft harvest site mid thigh to mid calf there is no erythema or exudate Neurologic:  Alert and oriented x 3. Skin:  Intact without lesions or rashes. Cervical Nodes:  no significant adenopathy Axillary Nodes:  no significant adenopathy Inguinal Nodes:  no significant adenopathy Psych:  Normal affect.   Detailed Cardiovascular Exam  Neck    Carotids: Carotids full and equal bilaterally without bruits.      Neck Veins: Normal, no JVD.    Heart    Inspection: no deformities or lifts noted.      Palpation: normal PMI with no thrills palpable.      Auscultation: regular rate and rhythm, S1, S2 without murmurs, rubs, gallops, or clicks.    Vascular    Abdominal Aorta: no palpable masses, pulsations, or audible bruits.      Femoral Pulses: normal femoral pulses bilaterally.      Radial Pulses: normal radial pulses bilaterally.      Peripheral Circulation: no clubbing, cyanosis, or edema noted with normal capillary refill.     EKG  Procedure date:  01/15/2010  Findings:      sinus rhythm, right bundle branch block, left axis deviation, left atrial enlargement, no acute ST-T wave changes.  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 I will continue with risk reduction. He will start cardiac rehabilitation tomorrow.  Problem # 2:  SWELLING OF LIMB (ICD-729.81) I will order a venous ultrasound to evaluate Brandon swelling. He is due to see Dr. Laneta Simmers in [place] next few weeks. He is not currently having fevers or chills or evidence of infection at this site.  Problem # 3:  TOBACCO ABUSE (ICD-305.1) I applauded his ability to quit smoking and encouraged continued abstinence.  Problem # 4:  HYPERTENSION  (ICD-401.9)  His Lopressor is controlled and he will continue on Brandon meds as listed.  Brandon following medications were removed from Brandon medication list:    Lisinopril-hydrochlorothiazide 20-12.5 Mg Tabs (Lisinopril-hydrochlorothiazide) .Marland Kitchen... Take 1 tablet by mouth once a day His updated medication list for this problem includes:    Aspirin 81 Mg Tbec (Aspirin) .Marland Kitchen... Take one tablet by mouth daily    Metoprolol Tartrate 25 Mg Tabs (Metoprolol tartrate) .Marland Kitchen... Take 1/2  tablet by mouth twice a day  Problem # 5:  HYPERLIPIDEMIA (ICD-272.4)  I will check a fasting lipid profile at Brandon next appointment.  His updated medication list for this problem includes:    Pravastatin Sodium 40 Mg Tabs (Pravastatin sodium) .Marland Kitchen... Take one tablet by mouth daily at bedtime  Other Orders: EKG w/ Interpretation (93000) Venous Duplex Upper Extremity (Venous Dup Upper E)  Pratt Instructions: 1)  Right lower extremity ultrasound  2)  Follow up in  2 months. Prescriptions: DILAUDID 2 MG TABS (HYDROMORPHONE HCL) as needed  #30 x 0   Entered by:  Hoover Brunette, LPN   Authorized by:   Rollene Rotunda, MD, [facility]   Signed by:   Hoover Brunette, LPN on 60/45/4098   Method used:   Print then Give to Pratt   RxID:   [id number] ALPRAZOLAM 0.25 MG TABS (ALPRAZOLAM) as needed  #30 x 0   Entered by:   Hoover Brunette, LPN   Authorized by:   Rollene Rotunda, MD, [facility]   Signed by:   Hoover Brunette, LPN on 65/78/4696   Method used:   Print then Give to Pratt   RxID:   [id number] METOPROLOL TARTRATE 25 MG TABS (METOPROLOL TARTRATE) Take 1/2  tablet by mouth twice a day  #90 x 3   Entered by:   Hoover Brunette, LPN   Authorized by:   Rollene Rotunda, MD, [facility]   Signed by:   Hoover Brunette, LPN on 25/36/6440   Method used:   Electronically to        [facility] (retail)       [address]       [place], [place]  [address]       Ph: [phone]       Fax: [phone]   RxID:    [id number] PRAVASTATIN SODIUM 40 MG TABS (PRAVASTATIN SODIUM) Take one tablet by mouth daily at bedtime  #90 x 3   Entered by:   Hoover Brunette, LPN   Authorized by:   Rollene Rotunda, MD, [facility]   Signed by:   Hoover Brunette, LPN on 57/32/2025   Method used:   Electronically to        [facility] (retail)       [address]       [place], [place]  [address]       Ph: [phone]       Fax: [phone]   RxID:   [id number]

## 2010-12-31 NOTE — Progress Notes (Signed)
Summary: TCT OFFICE VISIT (DR. Freddie Dymek)  TCT OFFICE VISIT (DR. Destanie Tibbetts)   Imported By: Zachary George 03/18/2010 13:43:17  _____________________________________________________________________  External Attachment:    Type:   Image     Comment:   External Document

## 2010-12-31 NOTE — Progress Notes (Signed)
  Phone Note From Other Clinic   Caller: Jennifer/Dr.Bartle Details for Reason: Pt.Information Initial call taken by: Denny Peon    Faxed LOV, Stress over to [phone] [facility]  December 18, 2009 9:36 AM

## 2010-12-31 NOTE — Progress Notes (Signed)
Summary: CALL TO CHECK LEG PAIN  Phone Note Outgoing Call   Call placed by: Carlye Grippe,  January 23, 2010 10:43 AM Call placed to: Patient Summary of Call: called and left message on machine that MD wanted to call and check on status of legs. left message on machine to call office.  Initial call taken by: Carlye Grippe,  January 23, 2010 10:44 AM  Follow-up for Phone Call        Patient returned call.  Please call [phone] Follow-up by: Claudette Laws,  January 23, 2010 1:31 PM  Additional Follow-up for Phone Call Additional follow up Details #1::        Patient said overall it has improved,still has pain when he's up walking around but while elevated wtih warm compresses,pain is at it minimal. Additional Follow-up by: Carlye Grippe,  January 23, 2010 1:39 PM

## 2010-12-31 NOTE — Progress Notes (Signed)
Summary: leg pain/doppler results   Phone Note Call from Patient Call back at Home Phone [phone]   Caller: Patient Call For: nurse Summary of Call: patient  called for results of doppler and also said he still has pain in his legs and want to know what he can do to get some relief.  Initial call taken by: Carlye Grippe,  January 18, 2010 2:28 PM  Follow-up for Phone Call        results given to patinet and informed him that per MD he is to keep legs elevated and use warm compresses. Also patient given s/s of infection like edema,fever,etc,and if any occur he is to go to ED. Patient verbalized understanding of plan. Follow-up by: Carlye Grippe,  January 18, 2010 4:09 PM

## 2010-12-31 NOTE — Consult Note (Signed)
Summary: [facility]   [facility]   Imported By: Roderic Ovens 01/02/2010 11:52:35  _____________________________________________________________________  External Attachment:    Type:   Image     Comment:   External Document

## 2010-12-31 NOTE — Miscellaneous (Signed)
Summary: [facility] Cardiac Progress Report   [facility] Cardiac Progress Report   Imported By: Roderic Ovens 02/12/2010 12:24:41  _____________________________________________________________________  External Attachment:    Type:   Image     Comment:   External Document

## 2010-12-31 NOTE — Letter (Signed)
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   [place], [place] [address]   Phone: [phone]  Fax: [phone]        December 14, 2009 MRN: [id number]    [facility] [address] [place], [place]  [address]    Dear Mr. Steier,  Your test ordered by Selena Batten has been reviewed by your physician (or physician assistant) and was found to be normal or stable. Your physician (or physician assistant) felt no changes were needed at this time.  ____ Echocardiogram  ____ Cardiac Stress Test  __X__ Lab Work  ____ Peripheral vascular study of arms, legs or neck  __X__ Chest X-ray  ____ Lung or Breathing test  ____ Other:   Thank you.   Cyril Loosen, RN, BSN    Duane Boston, M.D., F.A.C.C. Thressa Sheller, M.D., F.A.C.C. Oneal Grout, M.D., F.A.C.C. Cheree Ditto, M.D., F.A.C.C. Daiva Nakayama, M.D., F.A.C.C. Kenney Houseman, M.D., F.A.C.C. Jeanne Ivan, PA-C

## 2010-12-31 NOTE — Letter (Signed)
Summary: Internal Correspondence/ PATIENT HISTORY FORM  Internal Correspondence/ PATIENT HISTORY FORM   Imported By: Dorise Hiss 01/16/2010 09:26:53  _____________________________________________________________________  External Attachment:    Type:   Image     Comment:   External Document

## 2010-12-31 NOTE — Assessment & Plan Note (Signed)
Summary: discuss abnormal stress test/LA   Visit Type:  Follow-up Primary Provider:  Art Buff, MD   History of Present Illness: 55 year old male, with no known history of CAD, but with numerous cardiac risk factors, recently referred to Dr. Antoine Poche here in the office, for evaluation of chest pain. He was referred for an exercise stress test; however, patient was unable to achieve target heart rate, and the study was completed as a pharmacologic perfusion imaging test. This was interpreted as probably abnormal perfusion, but the study was of poor quality with significant limitations. Small, mild intensity defects in the apical/anteroseptal and basal inferolateral regions were noted, by Dr. Diona Browner. However, he pointed out that the rest images were of poor quality. EF was 59%. Patient now returns for review of this study result.  Clinically, he continues to have chest pain which is clearly atypical with respect to precipitant factors. He has been experiencing this for well over a year, and denies any accelerating pattern. This typically occurs while at rest, such as watching TV.  It is sudden in onset, sharp, with radiation to the neck and jaw. It can last 20 minutes, after he takes a pain medication. There is no associated shortness of breath, diaphoresis, or nausea. Of note, he denies any exertional component. He does have significant reflux disease, but states that these symptoms are different.  Preventive Screening-Counseling & Management  Alcohol-Tobacco     Smoking Status: current     Smoking Cessation Counseling: yes     Packs/Day: 2 PPD  Current Medications (verified): 1)  Aspirin 81 Mg Tbec (Aspirin) .... Take One Tablet By Mouth Daily 2)  Prilosec 20 Mg Cpdr (Omeprazole) .... Take 1 Tablet By Mouth Once A Day 3)  Lisinopril-Hydrochlorothiazide 20-12.5 Mg Tabs (Lisinopril-Hydrochlorothiazide) .... Take 1 Tablet By Mouth Once A Day 4)  Zymaxid 0.5 % Soln (Gatifloxacin) .... Use 1  Gtt Four Times Per Day in (L) Eye When Receiving Injections 5)  Avastin 100 Mg/1ml Soln (Bevacizumab) .... Inject in (L) Eye Once Per Month. 6)  Pravastatin Sodium 20 Mg Tabs (Pravastatin Sodium) .... Take 1 Tablet By Mouth Once A Day 7)  Flexeril 10 Mg Tabs (Cyclobenzaprine Hcl) .... As Needed  Allergies (verified): 1)  Codeine  Comments:  Nurse/Medical Assistant: The patient's medications and allergies were reviewed with the patient and were updated in the Medication and Allergy Lists. Verbally gave names of meds.  Past History:  Past Medical History: Last updated: 10/15/2009 SHORTNESS OF BREATH (ICD-786.05) CHEST PAIN UNSPECIFIED (ICD-786.50) DEPRESSION (ICD-311) GERD (ICD-530.81) OBESITY, UNSPECIFIED (ICD-278.00) OTHER MALAISE AND FATIGUE (ICD-780.79) CHRONIC PAIN SYNDROME (ICD-338.4) HYPERTENSION (ICD-401.9) (recent) HYPERLIPIDEMIA (ICD-272.4) (years) ENCOUNTER FOR LONG-TERM USE OF OTHER MEDICATIONS (ICD-V58.69) Tobacco  Family History: Reviewed history from 10/11/2009 and no changes required. Mother: + has hypertension and DM  also has a pacemaker Father: Died of a CVA  Social History: Reviewed history from 10/15/2009 and no changes required. Tobacco Use - Yes.  2 pack per day for 40 yrs Alcohol Use - yes  whiskey on weekends Drug Use - smokes pot occasionally to help with pain Disabled  Married, 2 Children, 1 grand Packs/Day:  2 PPD  Review of Systems       No fevers, chills, hemoptysis, dysphagia, melena, hematocheezia, hematuria, rash, claudication, orthopnea, pnd, pedal edema. All other systems negative.   Vital Signs:  Patient profile:   55 year old male Height:      70 inches Weight:      214 pounds Pulse rate:  73 / minute BP sitting:   129 / 89  (left arm) Cuff size:   large  Vitals Entered By: Carlye Grippe (December 13, 2009 2:49 PM)   Physical Exam  Additional Exam:  GENERAL:  55 year old male, sitting upright, in no distress. HEENT:  [place], AT. PERRLA, EOMI. NECK: Palpable carotid pulses, no bruits; no JVD; no thyromegaly. LUNGS: diminished breath sounds bilaterally. CARDIAC: RRR (S1,S2). No significant murmurs. No rubs, gallops. ABDOMEN: protuberant, nontender. Intact BS. EXTREMETIES: no significant edema. MUSCULOSKELETAL: No obvious deformity. SKIN: warm, dry. no obvious rashes. NEURO: Alert and oriented. No focal deficit.    Impression & Recommendations:  Problem # 1:  CHEST PAIN UNSPECIFIED (ICD-786.50)  recommendation is to proceed with definitive exclusion of significant CAD with a diagnostic coronary angiogram. Although the patient does present with a history which is clearly atypical for ischemic heart disease, he does have numerous cardiac risk factors, including a significant long-standing history of tobacco smoking. Moreoverl, he has continued to have chest pain over the past year, or so. The patient is in agreement with this recommendation, and the risk/benefits were discussed, in conjunction with Dr. Jens Som. We will arrange to have this scheduled in our JV catheterization lab. Patient is to continue medication regimen, which includes aspirin and a statin.  Problem # 2:  HYPERLIPIDEMIA (ICD-272.4) Assessment: Comment Only patient is currently on a statin, and we'll defer to primary care physician for ongoing monitoring and management.  Problem # 3:  HYPERTENSION (ICD-401.9) Assessment: Comment Only stable on  current medication regimen.  Problem # 4:  TOBACCO ABUSE (ICD-305.1) Assessment: Comment Only patient was counseled at length regarding tobacco smoking cessation.  Appended Document: discuss abnormal stress test/LA    Clinical Lists Changes  Orders: Added new Test order of T-Basic Metabolic Panel [phone]) - Signed Added new Test order of T-CBC No Diff ([id number]) - Signed Added new Test order of T-Protime, Auto ([id number]) - Signed Added new Test order of T-PTT ([id number]) -  Signed Added new Referral order of Cardiac Catheterization (Cardiac Cath) - Signed Added new Test order of T-Chest x-ray, 2 views ([id number]) - Signed Observations: Added new observation of PI CARDIO: Your physician has requested that you have a cardiac catheterization.  Cardiac catheterization is used to diagnose and/or treat various heart conditions. Doctors may recommend this procedure for a number of different reasons. The most common reason is to evaluate chest pain. Chest pain can be a symptom of coronary artery disease (CAD), and cardiac catheterization can show whether plaque is narrowing or blocking your heart's arteries. This procedure is also used to evaluate the valves, as well as measure the blood flow and oxygen levels in different parts of your heart.  For further information please visit [email or web address]  Please follow instruction sheet, as given. Your physician recommends that you go to the [facility] for lab work: DO TODAY. A chest x-ray takes a picture of the organs and structures inside the chest, including the heart, lungs, and blood vessels. This test can show several things, including, whether the heart is enlarged; whether fluid is building up in the lungs; and whether pacemaker / defibrillator leads are still in place. DO AT THE [facility] TODAY. (12/13/2009 15:48)       Patient Instructions: 1)  Your physician has requested that you have a cardiac catheterization.  Cardiac catheterization is used to diagnose and/or treat various heart conditions. Doctors may recommend this procedure for a number of different reasons. The most common reason is to  evaluate chest pain. Chest pain can be a symptom of coronary artery disease (CAD), and cardiac catheterization can show whether plaque is narrowing or blocking your heart's arteries. This procedure is also used to evaluate the valves, as well as measure the blood flow and oxygen levels in different parts of your heart.  For  further information please visit [email or web address]  Please follow instruction sheet, as given. 2)  Your physician recommends that you go to the [facility] for lab work: DO TODAY. 3)  A chest x-ray takes a picture of the organs and structures inside the chest, including the heart, lungs, and blood vessels. This test can show several things, including, whether the heart is enlarged; whether fluid is building up in the lungs; and whether pacemaker / defibrillator leads are still in place. DO AT THE [facility] TODAY.

## 2010-12-31 NOTE — Letter (Signed)
Summary: Cardiac Rehab  Cardiac Rehab   Imported By: Roderic Ovens 05/10/2010 14:25:11  _____________________________________________________________________  External Attachment:    Type:   Image     Comment:   External Document

## 2010-12-31 NOTE — Progress Notes (Signed)
Summary: check status of leg pain  Phone Note Outgoing Call Call back at [facility] Phone [phone]   Call placed by: Carlye Grippe,  February 19, 2010 3:29 PM Call placed to: Patient Summary of Call: left message on machine to call office.  Initial call taken by: Carlye Grippe,  February 19, 2010 3:29 PM  Follow-up for Phone Call        PATIENT RETURNED CALL AND SAID HE SAW DR. BARTLE AND HE WAS INFORMED THAT IT WAS HARD WATER AND WOULD HEAL OVER TIME AND KEEP ELEVATED AS MUCH AS POSSIBLE. Follow-up by: Carlye Grippe,  February 19, 2010 4:40 PM

## 2010-12-31 NOTE — Miscellaneous (Signed)
Summary: [facility] Progress Note  [facility] Progress Note   Imported By: Roderic Ovens 04/10/2010 11:32:52  _____________________________________________________________________  External Attachment:    Type:   Image     Comment:   External Document

## 2011-01-02 NOTE — Assessment & Plan Note (Signed)
Summary: 6 MO FUL   Visit Type:  Follow-up Primary Provider:  Art Buff, MD  CC:  CAD.  History of Present Illness: The patient presents for followup of his known coronary disease. Since I last saw him he has started smoking again. Since finishing the base to cardiac rehabilitation he has become depressed and sits on his couch watching TV and smoking cigarettes. He apparently has been treated in the past with antidepressants but currently refuses these because of real or perceived side effects. He thinks taking a Xanax and taking a nap and when he needs. He is currently not particularly active. However, with his activities of daily living he is not describing any reproducible chest pressure, neck or arm discomfort. He is not describing any palpitations, presyncope or syncope. He is having no edema, PND or orthopnea.  Preventive Screening-Counseling & Management  Alcohol-Tobacco     Smoking Status: current     Packs/Day: 2.0     Year Started: 1961  Current Medications (verified): 1)  Aspirin 81 Mg Tbec (Aspirin) .... Take One Tablet By Mouth Daily 2)  Prilosec 20 Mg Cpdr (Omeprazole) .... Take 1 Tablet By Mouth Two Times A Day 3)  Pravastatin Sodium 40 Mg Tabs (Pravastatin Sodium) .... Take One Tablet By Mouth Daily At Bedtime 4)  Flexeril 10 Mg Tabs (Cyclobenzaprine Hcl) .... As Needed 5)  Alprazolam 0.25 Mg Tabs (Alprazolam) .... As Needed 6)  Lisinopril 20 Mg Tabs (Lisinopril) .... Take One Tablet By Mouth Daily 7)  Viagra 50 Mg Tabs (Sildenafil Citrate) .... Take 1 Tablet By Mouth Once A Day As Needed. (Don't Use in Combo With Nitroglycerin)  Allergies (verified): 1)  Codeine  Comments:  Nurse/Medical Assistant: The patient's medications and allergies were reviewed with the patient and were updated in the Medication and Allergy Lists. Reviewed list w/ pt. Tammi Romine CMA (December 16, 2010 9:36 AM)  Past History:  Past Medical History: Reviewed history from 01/15/2010 and  no changes required. SHORTNESS OF BREATH (ICD-786.05) CHEST PAIN UNSPECIFIED (ICD-786.50) DEPRESSION (ICD-311) GERD (ICD-530.81) OBESITY, UNSPECIFIED (ICD-278.00) OTHER MALAISE AND FATIGUE (ICD-780.79) CHRONIC PAIN SYNDROME (ICD-338.4) HYPERTENSION (ICD-401.9) (recent) HYPERLIPIDEMIA (ICD-272.4) (years) ENCOUNTER FOR LONG-TERM USE OF OTHER MEDICATIONS (ICD-V58.69) Tobacco abuse CAD  Past Surgical History: Reviewed history from 01/15/2010 and no changes required. C Spine with fusions x 3 Right shoulder surgery Left shoulder surgery Appendectomy Left and right carpel tunnel syndrome Left elbow surgery  CABG (left internal mammary artery graft to the left anterior descending coronary artery, with a saphenous vein graft to the diagonal branch of the LAD, a saphenous vein graft to the second obtuse marginal branch of the left circumflex coronary artery, and a sequential saphenous vein graft to the acute marginal and distal right coronary artery.)  Social History: Smoking Status:  current Packs/Day:  2.0  Review of Systems       As stated in the HPI and negative for all other systems.   Vital Signs:  Patient profile:   55 year old male Height:      70 inches Weight:      216 pounds BMI:     31.10 Pulse rate:   76 / minute BP sitting:   121 / 88  (left arm) Cuff size:   large  Vitals Entered By: Fuller Plan CMA (December 16, 2010 9:36 AM)  Physical Exam  General:  Well developed, well nourished, in no acute distress. Head:  normocephalic and atraumatic Eyes:  PERRLA/EOM intact; conjunctiva and lids normal. Mouth:  Oral mucosa normal.  Upper dentures Neck:  Neck supple, no JVD. No masses, thyromegaly or abnormal cervical nodes. Chest Wall:  Well-healed sternotomy scar Lungs:  Clear bilaterally to auscultation and percussion. Abdomen:  Bowel sounds positive; abdomen soft and non-tender without masses, organomegaly, or hernias noted. No hepatosplenomegaly. Msk:  Back  normal, normal gait. Muscle strength and tone normal. Extremities:  No clubbing or cyanosis. Neurologic:  Alert and oriented x 3. Skin:  Intact without lesions or rashes. Cervical Nodes:  no significant adenopathy Inguinal Nodes:  no significant adenopathy Psych:  Normal affect.   Detailed Cardiovascular Exam  Neck    Carotids: Carotids full and equal bilaterally without bruits.      Neck Veins: Normal, no JVD.    Heart    Inspection: no deformities or lifts noted.      Palpation: normal PMI with no thrills palpable.      Auscultation: regular rate and rhythm, S1, S2 without murmurs, rubs, gallops, or clicks.    Vascular    Abdominal Aorta: no palpable masses, pulsations, or audible bruits.      Femoral Pulses: normal femoral pulses bilaterally.      Radial Pulses: normal radial pulses bilaterally.      Peripheral Circulation: no clubbing, cyanosis, or edema noted with normal capillary refill.     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 Unfortunately he is not participating and risk reduction. We had a long discussion about this. At this point I will make no change his medical regimen. I did discuss with him the maintenance phase of cardiac rehabilitation and I have suggested this.  Problem # 2:  TOBACCO ABUSE (ICD-305.1) We again discussed the need to stop smoking.  Problem # 3:  DEPRESSION (ICD-311) I had a long discussion with the patient and his wife about this. I think this is a major limiting factor in his ability to get well. He became tearful talking about the fact that all of his friends and support have died.  I asked him to go back and discuss this one issue with his primary provider and to consider again the need to medications or perhaps psychotherapy.  Problem # 4:  HYPERTENSION (ICD-401.9) His blood pressure is controlled and he will  continue the meds as listed.  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  I will defer lab draw for this to his primary MD.    Problem # 6:  HYPERLIPIDEMIA (ICD-272.4) He reports that this is followed by his primary MD.  Patient Instructions: 1)  Your physician wants you to follow-up in: 1 year. You will receive a reminder letter in the mail one-two months in advance. If you don't receive a letter, please call our office to schedule the follow-up appointment.  2)  Your physician recommends that you continue on your current medications as directed. Please refer to the Current Medication list given to you today.

## 2011-01-03 NOTE — Progress Notes (Signed)
Summary: Office Visit/ [facility]  Office Visit/ [facility]   Imported By: Dorise Hiss 01/16/2010 09:25:08  _____________________________________________________________________  External Attachment:    Type:   Image     Comment:   External Document

## 2011-02-16 LAB — POCT I-STAT 3, ART BLOOD GAS (G3+)
Acid-Base Excess: 5 mmol/L — ABNORMAL HIGH (ref 0.0–2.0)
Bicarbonate: 28.3 mEq/L — ABNORMAL HIGH (ref 20.0–24.0)
Bicarbonate: 29.7 mEq/L — ABNORMAL HIGH (ref 20.0–24.0)
O2 Saturation: 100 %
O2 Saturation: 100 %
TCO2: 30 mmol/L (ref 0–100)
TCO2: 31 mmol/L (ref 0–100)
pCO2 arterial: 41.6 mmHg (ref 35.0–45.0)
pCO2 arterial: 45.9 mmHg — ABNORMAL HIGH (ref 35.0–45.0)
pCO2 arterial: 47.8 mmHg — ABNORMAL HIGH (ref 35.0–45.0)
pH, Arterial: 7.378 (ref 7.350–7.450)
pH, Arterial: 7.396 (ref 7.350–7.450)
pO2, Arterial: 274 mmHg — ABNORMAL HIGH (ref 80.0–100.0)
pO2, Arterial: 276 mmHg — ABNORMAL HIGH (ref 80.0–100.0)
pO2, Arterial: 99 mmHg (ref 80.0–100.0)

## 2011-02-16 LAB — POCT I-STAT 4, (NA,K, GLUC, HGB,HCT)
Glucose, Bld: 120 mg/dL — ABNORMAL HIGH (ref 70–99)
HCT: 34 % — ABNORMAL LOW (ref 39.0–52.0)
HCT: 40 % (ref 39.0–52.0)
Hemoglobin: 13.6 g/dL (ref 13.0–17.0)
Hemoglobin: 9.5 g/dL — ABNORMAL LOW (ref 13.0–17.0)
Potassium: 3.7 mEq/L (ref 3.5–5.1)
Sodium: 133 mEq/L — ABNORMAL LOW (ref 135–145)
Sodium: 136 mEq/L (ref 135–145)
Sodium: 136 mEq/L (ref 135–145)

## 2011-02-16 LAB — GLUCOSE, CAPILLARY
Glucose-Capillary: 120 mg/dL — ABNORMAL HIGH (ref 70–99)
Glucose-Capillary: 126 mg/dL — ABNORMAL HIGH (ref 70–99)
Glucose-Capillary: 132 mg/dL — ABNORMAL HIGH (ref 70–99)
Glucose-Capillary: 132 mg/dL — ABNORMAL HIGH (ref 70–99)

## 2011-02-16 LAB — BASIC METABOLIC PANEL
BUN: 6 mg/dL (ref 6–23)
BUN: 7 mg/dL (ref 6–23)
CO2: 31 mEq/L (ref 19–32)
Chloride: 100 mEq/L (ref 96–112)
Chloride: 103 mEq/L (ref 96–112)
Creatinine, Ser: 0.83 mg/dL (ref 0.4–1.5)
Creatinine, Ser: 0.94 mg/dL (ref 0.4–1.5)
Glucose, Bld: 124 mg/dL — ABNORMAL HIGH (ref 70–99)

## 2011-02-16 LAB — HEMOGLOBIN AND HEMATOCRIT, BLOOD: Hemoglobin: 10 g/dL — ABNORMAL LOW (ref 13.0–17.0)

## 2011-02-16 LAB — URINALYSIS, ROUTINE W REFLEX MICROSCOPIC
Ketones, ur: NEGATIVE mg/dL
Nitrite: NEGATIVE
pH: 6.5 (ref 5.0–8.0)

## 2011-02-16 LAB — CBC
HCT: 35.5 % — ABNORMAL LOW (ref 39.0–52.0)
HCT: 44.5 % (ref 39.0–52.0)
Hemoglobin: 13.6 g/dL (ref 13.0–17.0)
Hemoglobin: 15.4 g/dL (ref 13.0–17.0)
MCHC: 34.1 g/dL (ref 30.0–36.0)
MCHC: 34.5 g/dL (ref 30.0–36.0)
MCHC: 35.8 g/dL (ref 30.0–36.0)
MCV: 96.6 fL (ref 78.0–100.0)
MCV: 97.4 fL (ref 78.0–100.0)
MCV: 97.7 fL (ref 78.0–100.0)
MCV: 98.1 fL (ref 78.0–100.0)
MCV: 98.2 fL (ref 78.0–100.0)
Platelets: 156 10*3/uL (ref 150–400)
Platelets: 174 10*3/uL (ref 150–400)
RBC: 3.62 MIL/uL — ABNORMAL LOW (ref 4.22–5.81)
RBC: 3.94 MIL/uL — ABNORMAL LOW (ref 4.22–5.81)
RDW: 13.6 % (ref 11.5–15.5)
RDW: 13.7 % (ref 11.5–15.5)
RDW: 13.7 % (ref 11.5–15.5)
WBC: 13.4 10*3/uL — ABNORMAL HIGH (ref 4.0–10.5)
WBC: 13.4 10*3/uL — ABNORMAL HIGH (ref 4.0–10.5)
WBC: 19.8 10*3/uL — ABNORMAL HIGH (ref 4.0–10.5)

## 2011-02-16 LAB — BLOOD GAS, ARTERIAL
Acid-Base Excess: 4.1 mmol/L — ABNORMAL HIGH (ref 0.0–2.0)
Bicarbonate: 28.2 mEq/L — ABNORMAL HIGH (ref 20.0–24.0)
O2 Saturation: 97.5 %
Patient temperature: 98.6
TCO2: 29.6 mmol/L (ref 0–100)

## 2011-02-16 LAB — COMPREHENSIVE METABOLIC PANEL
Alkaline Phosphatase: 56 U/L (ref 39–117)
BUN: 8 mg/dL (ref 6–23)
Creatinine, Ser: 0.82 mg/dL (ref 0.4–1.5)
Glucose, Bld: 102 mg/dL — ABNORMAL HIGH (ref 70–99)
Potassium: 4.4 mEq/L (ref 3.5–5.1)
Total Protein: 6.8 g/dL (ref 6.0–8.3)

## 2011-02-16 LAB — CREATININE, SERUM: Creatinine, Ser: 0.8 mg/dL (ref 0.4–1.5)

## 2011-02-16 LAB — PLATELET COUNT: Platelets: 143 10*3/uL — ABNORMAL LOW (ref 150–400)

## 2011-02-16 LAB — POCT I-STAT, CHEM 8
BUN: 7 mg/dL (ref 6–23)
Chloride: 104 mEq/L (ref 96–112)
HCT: 37 % — ABNORMAL LOW (ref 39.0–52.0)
Potassium: 4.8 mEq/L (ref 3.5–5.1)
Sodium: 136 mEq/L (ref 135–145)

## 2011-02-16 LAB — HEMOGLOBIN A1C
Hgb A1c MFr Bld: 6.4 % — ABNORMAL HIGH (ref 4.6–6.1)
Mean Plasma Glucose: 137 mg/dL

## 2011-02-16 LAB — MRSA PCR SCREENING: MRSA by PCR: NEGATIVE

## 2011-02-16 LAB — MAGNESIUM: Magnesium: 2.4 mg/dL (ref 1.5–2.5)

## 2011-02-16 LAB — APTT
aPTT: 32 seconds (ref 24–37)
aPTT: 33 seconds (ref 24–37)

## 2011-02-16 LAB — TYPE AND SCREEN: ABO/RH(D): A POS

## 2011-02-16 LAB — PROTIME-INR: Prothrombin Time: 16.1 seconds — ABNORMAL HIGH (ref 11.6–15.2)

## 2011-04-15 NOTE — Consult Note (Signed)
NEW PATIENT CONSULTATION   Brandon Pratt, Brandon Pratt  DOB:  1956/04/02                                        December 18, 2009  CHART #:  [id number]   REASON FOR CONSULTATION:  Severe three-vessel coronary artery disease.   CLINICAL HISTORY:  I was asked by Dr. Antoine Poche to evaluate the patient  for consideration of coronary artery bypass graft surgery.  He is a 55-  year-old gentleman with a history of heavy smoking and recent  hyperlipidemia who reports a several-month history of lower substernal  chest discomfort radiating up into his upper chest and neck.  The  symptoms have usually occurred at rest while he is watching TV or  sleeping.  He has not noticed any symptoms with exertion, although he  has not been very active recently.  Some of these episodes have lasted  about 20 minutes before resolving.  He was referred to Cardiology for  evaluation and an exercise stress test was performed, but the patient  was unable to exercise to achieve this target heart rate and therefore  the study was completed as a pharmacologic perfusion exam.  It was  interpreted as probably abnormal perfusion, but was of poor quality.  There were small mild defects in the apical, anteroseptal, and basal  inferolateral regions.  Ejection fraction was 59%.  He has continued to  have symptoms and therefore underwent cardiac catheterization on December 17, 2009.  This showed severe three-vessel coronary artery disease.  The  LAD was occluded proximally after the first diagonal.  The distal vessel  filled by collaterals from the right coronary artery.  The circumflex  was occluded in the AV groove portion before the second obtuse marginal  branch and there was a long occlusion after the second obtuse marginal  before the third marginal.  The second and third marginal filled by  bridging collaterals and the second marginal filled by collaterals from  the large right ventricular branch.  The third  marginal also filled by  this large right-sided collateral.  The right coronary artery was a  dominant vessel that had about 70% proximal disease.  After the RV  marginal branch which supply collaterals to the left system, there was a  focal 60% followed by a 70% stenosis.  There is a distal 75-80% stenosis  compromising a moderate-sized posterior descending artery.  Ejection  fraction about 65%.   REVIEW OF SYSTEMS:  His review of systems is as follows:  GENERAL:  He denies any fever or chills.  He denies any recent weight  changes.  He has had some loss of appetite and also reports a long  history of fatigue.  EYES:  About 5 months ago, he did have partial vision loss in his left  eye.  He described this as being the lower inner quadrant of his visual  field in the left eye.  He said he was seen by an ophthalmologist and  told that he had a stroke.  He has been receiving intraocular  injections for this.  ENT:  Negative.  ENDOCRINE:  He denies diabetes and hypothyroidism.  CARDIOVASCULAR:  As above.  He denies PND and orthopnea.  He denies  exertional dyspnea, but has not been very active.  He denies  palpitations and peripheral edema.  RESPIRATORY:  He denies cough and sputum  production.  GI:  He has had no nausea or vomiting.  He does report some dysphagia  since his second cervical spine fusion and was told this was due to  retraction on his esophagus.  He does report some intermittent  constipation and diarrhea.  GU:  He denies dysuria and hematuria.  He does have frequent urination.  VASCULAR:  He denies claudication, but does report pain in his legs and  feet when he walks.  He has never had DVT or phlebitis.  NEUROLOGICAL:  He denies any focal weakness or numbness.  He denies  dizziness and syncope.  He has never had a TIA or a stroke other than  the problem with his left eye.  MUSCULOSKELETAL:  He does have a history of arthritis and has had 3  cervical spine fusions for  degenerative disease.  PSYCHIATRIC:  He does have a history of depression and nervousness and  has chronic pain syndrome.  HEMATOLOGICAL:  Negative.   ALLERGIES:  To codeine, which causes stomach upset.   CURRENT MEDICATIONS:  1. Aspirin 81 mg daily.  2. Prilosec 20 mg daily.  3. Lisinopril/HCTZ 20/12.5 daily.  4. Avastin in his left eye once a month.  5. ZYMAXID 0.5% solution 1 drop q.i.d. in his left eye when he is      receiving Avastin injection.  6. Flexeril 10 mg p.r.n.  7. Pravastatin 10 mg daily.  8. Sublingual nitroglycerin p.r.n.  9. Imdur ER 30 mg daily.   PAST MEDICAL HISTORY:  Significant for hypertension and hyperlipidemia.  He denies diabetes.  He is status post cervical spine fusion x3 in the  past.  He underwent 3 carpal tunnel surgeries in the past.  He had had  surgery on both shoulders in the past.  He had surgery on his left elbow  in the past.  He is status post appendectomy.   SOCIAL HISTORY:  He is disabled, lives with his wife.  He smokes 2 packs  per day and has smoked as many as 3 packs per day for 40 years.  He  drinks some whiskey on the weekends.  He occasionally smokes marijuana  to help with his pain.  He has 2 children.   FAMILY HISTORY:  Mother had hypertension, diabetes, and also has a  pacemaker and is still living.  Father died of stroke.   PHYSICAL EXAMINATION:  Vital Signs:  Blood pressure 116/73, pulse 70 and  regular, and respiratory rate 18 unlabored.  Oxygen saturation is 96% on  room air.  General:  He is an obese white male, in no distress.  HEENT:  Normocephalic and atraumatic.  Pupils are equal and reactive to light  and accommodation.  Extraocular muscles are intact.  Throat is clear.  He is missing several upper teeth.  Neck:  Normal carotid pulses  bilaterally.  There are no bruits.  There is no adenopathy or  thyromegaly.  Cardiac:  Regular rate and rhythm with normal S1 and S2.  There is no murmur, rub, or gallop.  Abdomen:   Active bowel sounds.  His  abdomen is soft, obese, and nontender.  There are no palpable masses or  organomegaly.  Extremities:  No peripheral edema.  Pedal pulses are  palpable bilaterally.  Skin:  Warm and dry.  Neurologic:  Alert and  oriented x3.  Motor and sensory exams grossly normal.   IMPRESSION:  The patient has severe three-vessel coronary artery disease  with the majority of his heart surviving  off of the collaterals from his  highly diseased right coronary artery.  His symptoms are atypical, but I  believe a lot of his symptoms are ischemic in nature.  I agree that  coronary artery bypass graft surgery is the best treatment to prevent  further ischemia and infarction and to improve his quality of life.  I  discussed the operative procedure with he and his wife including  alternatives, benefits, and risks including, but not limited to  bleeding, blood transfusion, infection, stroke, myocardial infarction,  graft failure, and death.  I also discussed the importance of maximum  cardiac risk factor reduction including complete smoking cessation and  control of his hyperlipidemia.  He understands all this and agrees to  proceed.  We will plan to do surgery on Monday, December 24, 2009.   Evelene Croon, M.D.  Electronically Signed   BB/MEDQ  D:  12/18/2009  T:  12/18/2009  Job:  [id number]   cc:   Rollene Rotunda, MD, [facility]

## 2011-04-15 NOTE — Assessment & Plan Note (Signed)
OFFICE VISIT   SWANSON, FARNELL  DOB:  July 19, 1956                                        January 23, 2010  CHART #:  [id number]   HISTORY:  The patient returned to my office today for followup status  post coronary artery bypass graft surgery x5 on December 24, 2009.  His  postoperative course was uncomplicated.  Since discharge, he said he has  been feeling fairly well and is walking without chest pain or shortness  of breath.   PHYSICAL EXAMINATION:  Vital Signs:  His blood pressure is 129/85, pulse  78 and regular, respiratory rate is 18 unlabored.  Oxygen saturation on  room air is 98%.  General:  He looks well.  Cardiac:  Regular rate and  rhythm with normal heart sounds.  Lungs:  Clear.  Chest incision is  healing well and the sternum is stable.  His right leg incision is  healing well, and there is no significant peripheral edema.   Followup chest x-ray shows clear lung fields and no pleural effusions.   MEDICATIONS:  1. Lopressor 12.5 mg b.i.d.  2. Alprazolam 0.25 mg p.r.n.  3. Hydromorphone 2 mg p.r.n. for pain.  4. Pravastatin 40 mg daily.  5. Flexeril 10 mg p.r.n.  6. Prilosec 20 mg daily.  7. Aspirin 325 mg daily.   IMPRESSION:  The patient appears to be recovering from his surgery well.  I encouraged him to continue walking as much as possible.  I also  encouraged him to continue abstaining from smoking.  I told him he could  return to driving a car, but should refrain of lifting anything  heavier than 10 pounds for a total of 3 months from date of surgery.  He  will continue to follow up with Cardiology and will contact me if he  develops any problems with his incisions.   Evelene Croon, M.D.  Electronically Signed   BB/MEDQ  D:  01/23/2010  T:  01/24/2010  Job:  [id number]

## 2011-04-18 NOTE — Discharge Summary (Signed)
Brandon Pratt, Brandon Pratt                 ACCOUNT NO.:  [id number]   MEDICAL RECORD NO.:  [id number]          PATIENT TYPE:  INP   LOCATION:  6523                         FACILITY:  [facility]   PHYSICIAN:  Cecil Cranker, M.D.DATE OF BIRTH:  10-24-1956   DATE OF ADMISSION:  07/22/2005  DATE OF DISCHARGE:  07/25/2005                                 DISCHARGE SUMMARY   PRINCIPAL DIAGNOSIS:  Atypical chest pain.   OTHER DIAGNOSES:  1.  Anxiety.  2.  History of spinal fusion in November, 2005.  3.  Tendinitis, bilateral shoulders.  4.  History of carpal tunnel syndrome.  5.  Left-sided facial numbness.   PRIMARY CARE PHYSICIAN:  Delaney Meigs, M.D.   ALLERGIES:  CODEINE.   PROCEDURES:  1.  Exercise Myoview.  2.  CT of the head.  3.  MRI/MRA of the head.   HISTORY OF PRESENT ILLNESS:  A 55 year old white male who presented to [facility] ED on August 22 following an approximately one month history of  intermittent chest pressure and shoulder discomfort radiating down the left  arm, different  from previous pain that he associates with spinal fusion and  chronic back pain.  He also reported a left-sided facial numbness, sometimes  associated with the confusion.  Patient was admitted for further evaluation.   HOSPITAL COURSE:  The patient ruled out for MI by cardiac enzymes x2.  He  underwent an exercise Myoview on August 23, which showed no areas of  significant ischemia.  Secondary to left-sided facial numbness with  headache, neurology was consulted and a CT of his head was performed,  revealing no acute findings with small vessel disease as well as left  maxillary sinus disease.  Neurology subsequently recommended MRI/MRA of the  brain, which showed probable chronic infarcts without any acute  abnormalities with the MRA showing no occlusive disease.  Patient was  cleared from a neurologic standpoint for discharge and will be going home  today in satisfactory condition with  planned followup with his primary care  physician, Dr. Lysbeth Galas.   DISCHARGE LABS:  Hemoglobin 15.6, hematocrit 44.7, WBC 9.2, platelets 195,  MCV 95.6.  Sodium 141, potassium 4.9, chloride 101, CO2 34, BUN 7,  creatinine 1.1, glucose 95.  PT 13.9, INR 1.1, PTT 29.  Total bilirubin 0.5.  Alkaline phosphatase 66.  AST 13, ALT 12.  Cardiac enzymes are negative x2.  Total cholesterol 175, triglycerides 267, HDL 26, LDL 96, calcium 9.2.  Hemoglobin A1C is pending.  Homocysteine is pending.  TSH is 1.899.   DISPOSITION:  Patient is being discharged home in good condition.   FOLLOW-UP APPOINTMENTS:  He is asked to follow up with his primary care  physician, Dr. Joette Catching in 1-2 weeks for additional evaluation.   DISCHARGE MEDICATIONS:  1.  Mobic 7.5 mg daily.  2.  Lexapro 20 mg daily.  3.  Prilosec OTC 20 mg daily.  4.  Aspirin 81 mg daily.   OUTSTANDING LAB STUDIES:  Homocysteine and hemoglobin A1C pending.   DURATION DISCHARGE ENCOUNTER:  Forty minutes.  Ok Anis, NP    ______________________________  E. Graceann Congress, M.D.    CRB/MEDQ  D:  07/25/2005  T:  07/25/2005  Job:  [id number]

## 2011-04-18 NOTE — Consult Note (Signed)
Brandon Pratt, Brandon Pratt                 ACCOUNT NO.:  [id number]   MEDICAL RECORD NO.:  [id number]          PATIENT TYPE:  INP   LOCATION:  6523                         FACILITY:  [facility]   PHYSICIAN:  Deanna Artis. Hickling, M.D.DATE OF BIRTH:  1955/12/07   DATE OF CONSULTATION:  07/24/2005  DATE OF DISCHARGE:                                   CONSULTATION   REFERRING PHYSICIAN:  Dr. Cecil Cranker.   CHIEF COMPLAINT:  Facial numbness and dizziness.   REASON FOR CONSULTATION:  I was asked by Dr. Corinda Gubler to see Brandon Pratt, a  55 year old gentleman from [place], [place] who presented with chest  pain, pain in his left arm and shoulder, and bifacial numbness and  dizziness.   The patient says he awakens frequently in the morning and has a confusional  period that lasts for about an hour.  He has also had confusion in  association with these episodes of facial numbness and dizziness.  The  dizziness is an unsteadiness rather than a vertigo.   REVIEW OF SYSTEMS:  Review of systems is remarkable for neck pain.  The  patient has had chronic recurrent cervical spondylosis and has had 3 spinal  fluids, the last which has provided more relief than any other.  He has also  had pain in his left arm and shoulder, insomnia, decreased appetite with a  50- to 60-pound weight loss over 5-6 months and an episode of syncope 3-4  months that lasted for a few seconds.   The patient has been seen previously at [facility] for  pain and numbness in all extremities.  This was felt to be myofascial.  He  has had both lumbar and cervical spondylosis.  He has also had a history of  headaches radiating from his neck up to his head.  Currently, the headaches  are of a much different quality and are pounding and lancinating,  holocephalic, lasting for an hour or so, occurring perhaps several times a  day.  They do not have the facial burning qualities of cluster headaches.  He has had  these for 2-3 months.  He treated them with Tylenol, but says  that that does not help.  He has avoided the use of narcotic analgesics.   Other medical problems at that this time include possible hypertension,  anxiety disorder, depression, tendinitis.  In 1986, the patient fell off a ladder backwards onto a concrete floor and  was severely injured, which started these symptoms.   PAST SURGICAL HISTORY:  1.  Carpal tunnel releases.  2.  Appendectomy.  3.  Elbow and arm surgery.  4.  Three spinal fusions.   CURRENT MEDICATIONS:  1.  Mobic 7.5 mg per day.  2.  Lexapro 20 mg per day.  3.  Prilosec as needed; he has switched to Protonix 40 mg per day.  4.  Lorcet 7.5/500.  5.  Aspirin 81 mg per day.   DRUG ALLERGIES:  CODEINE.   SOCIAL HISTORY:  The patient is disabled.  He has been married and has  children.  He  has up to 3-pack-per-day smoking history, but has averaged  between 1/2 and 1-1/2 packs per day for 35 years.  He does not exercise.  He  says he is trying to watch his intake.  The patient smokes marijuana for  relaxation and for pain control.   FAMILY HISTORY:  Family history is remarkable for diabetes mellitus in his  mother at age 11, a fatal cerebrovascular accident in his father at age 79.  He has 2 sisters, 1 with diabetes, and a brother who is healthy.   REVIEW OF SYSTEMS:  His review of systems is otherwise positive for slight  dyspnea on exertion, history of diarrhea, gastroesophageal reflux disease,  right-sided abdominal pain and cold intolerance.   PHYSICAL EXAMINATION:  GENERAL:  On examination today, a pleasant gentleman  sitting in bed in no acute distress.  VITAL SIGNS:  Temperature 98.7 degrees, blood pressure 136/80, resting pulse  60, respirations 20, oxygen saturation 98% on room air.  NECK:  He has supple, decreased range of motion, no bruits.  ENT:  No infection.  LUNGS:  Clear.  HEART:  No murmurs.  Pulses normal.  ABDOMEN:  Protuberant.   Bowel sounds normal.  No hepatosplenomegaly.  EXTREMITIES:  Unremarkable.  NEUROLOGICAL:  The patient was awake and alert without dysphagia or  dyspraxia.  Cranial nerves:  Round, reactive pupils.  No afferent pupillary  defect.  Visual fields full.  Symmetric facial strength.  Midline tongue and  uvula.  Normal sensation on his face; it is altered on his neck, but he has  had surgery in several locations.  Sensation seems normal elsewhere.  Air  conduction greater than bone conduction; the left ear hears better than the  right.  He has midline tongue and uvula.  Motor examination:  Normal  strength, no drift, good fine motor movements.  Cerebellar:  Good finger-to-  nose and rapid repetitive movements.  Gait broad-based; he cannot tandem.  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  He is areflexic.  He has bilateral flexor plantar  responses.   IMPRESSION:  1.  Numbness in the face, 782.0.  2.  Dizziness, 780.4.  3.  Headache, possible atypical migraine, 346.20.   I have reviewed his CT of the brain which shows multiple areas of  attenuation that would be consistent with atherosclerotic or ischemic  change; I cannot age them.  He also had a choroid fissure cyst that is in  the left deep frontal region and is a normal variant.  There is no evidence  of acute stroke or of hemispheric stroke.   PLAN:  We need to evaluate him with an MRI of the brain, MRI, intracranial,  to look for acute cerebrovascular disease.  I have reviewed his carotid  Doppler and it shows no hemodynamically significant lesions.  I presume that  a 2-D echocardiogram has been done; if not and we show evidence of acute  stroke, that needs to be done.  He also would need a fasting lipid,  hemoglobin A1c and serum homocysteine, which should be done regardless of  the presence of absence of acute findings.  Also, if he has acute findings, a transcranial Doppler  should be done.   I appreciate the opportunity to participate in his care.   I doubt that the symptoms that he has are related to acute cerebrovascular  disease, although they may be related to chronic cerebrovascular disease.      Deanna Artis. Sharene Skeans, M.D.  Electronically Signed     WHH/MEDQ  D:  07/24/2005  T:  07/25/2005  Job:  [id number]   cc:   Cecil Cranker, M.D.  [address]  [address]  [place]  [place] [address]   Delaney Meigs, M.D.  [address]  [place]  [place] [address]  Fax: [phone]

## 2011-04-18 NOTE — Op Note (Signed)
NAMEHELAMAN, MECCA NO.:  [id number]   MEDICAL RECORD NO.:  [id number]          PATIENT TYPE:  INP   LOCATION:  5024                         FACILITY:  [facility]   PHYSICIAN:  Sharolyn Douglas, M.D.        DATE OF BIRTH:  [birth year]   DATE OF PROCEDURE:  10/10/2004  DATE OF DISCHARGE:                                 OPERATIVE REPORT   PREOPERATIVE DIAGNOSES:  1.  Cervical spondylosis, C6-7 with chronic neck and upper extremity pain.  2.  Status post previous C4 through C6 anterior cervical decompression and      fusion with plate.   POSTOPERATIVE DIAGNOSES:  1.  Cervical spondylosis, C6-7 with chronic neck and upper extremity pain.  2.  Status post previous C4 through C6 anterior cervical decompression and      fusion with plate.   OPERATION PERFORMED:  1.  Removal of anterior cervical plate, C4 through C6 with exploration of      fusion.  2.  Anterior cervical diskectomy, C6-7.  3.  Anterior cervical arthrodesis, C6-7.  4.  Left anterior iliac crest bone graft.  5.  Anterior cervical plating, C6-7 using [employer] system.   SURGEON:  Sharolyn Douglas, M.D.   ASSISTANT:  Verlin Fester, P.A.   ANESTHESIA:  General endotracheal.   COMPLICATIONS:  None.   INDICATIONS FOR PROCEDURE:  The patient is a 55 year old male who is status  post two previous anterior cervical diskectomy and fusions, first at C4-5  and at C5-6 with a plate from C4 to C6.  He has had two previous anterior  iliac crest bone grafts, both right and left sides.  His first surgery was  done by Dr. Newell Coral.  The second surgery was done in [place].  He has  developed increasing pain and radiculopathy secondary to adjacent segment  problems at the C6-7 level below his fusion.  He has elected to undergo  removal of the plate and extension of the fusion.  Risks, benefits and  alternatives were reviewed.   DESCRIPTION OF PROCEDURE:  The patient was properly identified in the  holding area and taken  to the operating room.  He underwent general  endotracheal anesthesia without difficulty.  He was given prophylactic  intravenous antibiotics.  He was carefully positioned with his neck in  slight extension.  The neck was prepped and draped in the usual sterile  fashion.  The previous transverse incision was utilized on the left side.  Dissection was carried to the platysma.  The interval between  sternocleidomastoid and strap muscles medially was developed down to the  prevertebral space.  Because of the previous scar, great care was taken to  identify the esophagus, trachea and carotid sheath and protect these  structures at all times.  The previous plate was identified and elevator was  utilized to completely expose the plate from C4 down to C6.  We then used  the appropriate Synthes screwdrivers to remove the fixation screws and  plate.  We evaluated the fusion at C4-5 and C5-6 and there was no motion.  We  then turned our attention to elevating the longus coli muscle out over  the 6-7 disk space bilaterally.  There was a large anterior osteophyte.  This was removed with a Leksell rongeur.  This space was very degenerative  and collapsed.  Caspar distraction pins were placed in C6-C7 vertebral  bodies.  Gentle distraction applied.  Microscope brought into the field.  We  completed the diskectomy back to the posterior longitudinal ligament.  A  high speed bur used to take down the cartilaginous end plates as well as the  posterior vertebral margins and uncovertebral joints.  The posterior  longitudinal ligament was taken down.  Wide foraminotomies were completed.  We felt we had a good decompression of the spinal canal and nerve roots  bilaterally.  We then turned our attention to obtaining a left anterior  iliac crest bone graft. The previous incision was utilized.  Dissection was  carried through the deep fascia and the iliac crest exposed.  8 mm  tricortical graft obtained using a  sagittal saw and osteotomes.  Bleeding  was controlled with bone wax.  The deep fascia was repaired with 0 Vicryl  followed by 2-0 Vicryl in the subcutaneous layer, then a running 3-0  subcuticular Vicryl suture on the skin edges.  Benzoin and Steri-Strips  placed.  We then turned our attention back to the neck.  The tricortical  iliac crest bone graft was carefully tamped into position, countersunk 1 mm.  We removed the Caspar distraction and the weight from the patient's head.  We then placed a 30 mm Sp[employer]s anterior cervical plate with four 12  mm locking screws.  We had good screw purchase.  We ensured the locking nuts  engaged.  The wound was irrigated.  Esophagus, trachea and carotid sheath  examined and there were no apparent injuries.  Deep Penrose drain was  placed.  Platysma was closed with an interrupted 2-0 Vicryl.  Subcutaneous  layer closed with interrupted 3-0 Vicryl followed by a running 4-0  subcuticular suture on the skin.  Benzoin and Steri-Strips were placed.  Cervical collar applied.  The patient was extubated without difficulty and  transferred to the recovery room in stable condition able to move his upper  and lower extremities.       MC/MEDQ  D:  10/10/2004  T:  10/11/2004  Job:  [id number]

## 2011-04-18 NOTE — Discharge Summary (Signed)
Brandon Pratt, Brandon Pratt                 ACCOUNT NO.:  [id number]   MEDICAL RECORD NO.:  [id number]          PATIENT TYPE:  INP   LOCATION:  6523                         FACILITY:  [facility]   PHYSICIAN:  Vida Roller, M.D.   DATE OF BIRTH:  1956/10/29   DATE OF ADMISSION:  07/22/2005  DATE OF DISCHARGE:  07/25/2005                                 DISCHARGE SUMMARY   DISCHARGE DIAGNOSES:  1.  Admitted with two weeks of intermittent chest pain, referred to left arm      and left shoulder/facial numbness and trouble with word      finding/insomnia/increased stressors/anorexia.  2.  Nonintended weight of 50-60 pounds.  3.  Electrocardiogram, nondiagnostic for acute myocardial infarction.  4.  Cardiac enzymes: Markers 0.05 times three. Troponin #1 studies 0.01 and      0.01.  5.  Exercise Cardiolite study which is pending.  6.  Neurologic workup to include:      1.  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          No acute intracranial abnormality, chronic sinus disease.      2.  MRI revealed no acute infarct.      3.  MRA revealed no conclusive disease.  7.  Abnormal lipid profile with cholesterol of 175, triglycerides 267, HDL      cholesterol 26, LDL cholesterol 96. Suggest primary care workup for      hypertriglyceridemia.  8.  Continued tobacco habituation.   SECONDARY DIAGNOSES:  1.  Hypertension.  2.  Anxiety.  3.  History of spinal fusion times three. Last surgery done in November      2005.  4.  Tendinitis in shoulder.  5.  Appendectomy at age 73.  6.  Carpal tunnel release times two on the right and one on the left.  7.  Unknown arm surgery in 2002.  8.  Trauma in 1986 when the patient fell off the ladder to a concrete floor.   ALLERGIES:  CODEINE.   PROCEDURE THIS ADMISSION:  1.  Exercise Cardiolite study, results are pending.  2.  Neurologic workup to consist of computer tomogram of the head, MRI, MRA.      These workups have been dictated  above.   DISCHARGE DISPOSITION:  Brandon Pratt is discharged on hospital day #4,  has remained chest pain free throughout this hospitalization. He has had  carotid duplex ultrasound study as well done on August 23rd showing no  evidence of significant bilateral internal carotid artery stenosis and  vertebral artery flow antegrade. The patient has atypical chest pain that  does increase with activity. However he has been ruled out for acute  myocardial infarction. An exercise Myoview has also been done with no acute  findings during the study. The patient needs no further workup at this time.  He will report to Dr. Joette Catching in the next one to two weeks for follow-  up with him.   BRIEF HISTORY:  Brandon Pratt is a 55 year old male who has complained of  headache for  the last one month. He also feels a slight pressure in his  chest which feels like gas. He complains of facial numbness. The left cheek  feels drawn up, however it is not physically drawn up. The patient also  states that he has confusion after episodes of facial numbness. The patient  also says that his neck some times gets tight with the episodes of facial  numbness. The patient is having pain in the left shoulder and somewhat down  the arm at this time. The patient also states that the pain is different  from spinal fusion pain from previous spinal fusion surgeries. Also states  he has not been sleeping well, has decreased appetite, and has 50-60 pounds  over the last five to six months. This is unintentional. The patient also  says he had a syncopal episode about three to four months ago lasting three  to four seconds. The patient said he is under a lot of stress related to the  fact that his wife in behavioral health for attempted suicide. The patient  currently has tobacco habituation   HOSPITAL COURSE:  The patient presented on medical attention on August 22nd  with a constellation of symptoms which range  from headaches to syncope,  chest pain, with referred pain, neck and arm pain, left facial numbness with  concomitant confusion and neck tightness. The patient underwent an  electrocardiogram which was nondiagnostic for acute myocardial infarction.  He also had serial cardiac enzymes times five which were all within normal  limits as dictated above. He underwent an exercise stress test.  Unfortunately the results of the study are pending at the time. The patient  also had neurologic workup which consisted at first of carotid duplex  ultrasound. The study showed no evidence of internal carotid artery stenosis  and antegrade vertebral flow. He also had a complete workup by the neurology  service including complete neurologic physical examination and also  consisting of CT of the head which showed no acute infarct. An MRI also  verified no acute infarct.  An MRA revealed no acute occlusive disease.  Conclusion from the standpoint of cardiac service is that he has had  atypical chest pain not resultant from a cardiac source.   LABORATORY STUDIES THIS ADMISSION:  TSH 1.899. Lipid profile revealed  cholesterol 175, triglycerides 267, HDL cholesterol 26, LDL cholesterol 96,  alkaline phosphatase 66, SGOT 13, SGPT 12.  Serum electrolytes reveal sodium  of 141, potassium 4.9, chloride 101, bicarbonate 34, BUN 7, creatinine 1.1,  glucose 95.  Complete blood count revealed white cells 9.2, hemoglobin 15.6,  hematocrit 44.7, platelet count 195,000. BP ranged from 120s to 130s  systolic.      Maple Mirza, P.A.      Vida Roller, M.D.  Electronically Signed    GM/MEDQ  D:  07/25/2005  T:  07/26/2005  Job:  [id number]   cc:   Delaney Meigs, M.D.  [address]  [place]  [place] [address]  Fax: [phone]

## 2011-04-18 NOTE — H&P (Signed)
NAMEZACARI, STIFF NO.:  [id number]   MEDICAL RECORD NO.:  [id number]          PATIENT TYPE:  INP   LOCATION:  1824                         FACILITY:  [facility]   PHYSICIAN:  Vida Roller, M.D.   DATE OF BIRTH:  1956/08/26   DATE OF ADMISSION:  07/22/2005  DATE OF DISCHARGE:                                HISTORY & PHYSICAL   Primary care is Delaney Meigs, M.D.   CHIEF COMPLAINT:  Chest pain x2 weeks.   HISTORY OF PRESENT ILLNESS:  The patient is a 55 year old Caucasian male  with a complaint of headaches for approximately one month's time.  The  patient states that he also feels slight pressure in his chest which feels  like gas at that same time.  The patient also complains of facial numbness,  the cheek on his left side that feels like it is drawn up; however, it does  not appear to be physically drawn up.  The patient also states that after he  has those episodes, he has confusion.  The patient also states that  sometimes his neck gets tight with these episodes.  The patient states that  he had pain in his left shoulder and somewhat down his arm at this time.  The patient also states that this pain is different than spinal fusion pain  from previous spinal fusion surgeries.  The patient also states that he has  not been sleeping good, he has a decreased appetite and has lost over 50-60  pounds over the last five to six months, which was nonintentional.  The  patient also states that he had a syncopal episode about three or four  months ago that lasted approximately three to four seconds.  The patient  also states that he is under a lot of stress related to the fact that his  wife is in the Behavioral Health for attempted suicide.   PAST MEDICAL HISTORY:  1.  Hypertension, which is questionable.  The patient states that he was put      on hydrochlorothiazide and atenolol during rehab but stated that he did      not think he needed those medications and  stopped taking them.  2.  Anxiety.  3.  Spinal fusion x3, last surgery done in November of last year.  4.  Tendinitis in his shoulders.  5.  Appendectomy, age 42.  6.  Carpal tunnel, two on the right and one on the left.  7.  An unknown arm surgery in 2002, where he had nerves repaired in his      elbow.  8.  Trauma in 1986.  The patient fell off a ladder onto a concrete floor.   ALLERGIES:  The patient is allergic to CODEINE.   CURRENT MEDICATIONS:  1.  Mobic 7.5 mg daily.  2.  Lexapro 20 mg daily.  3.  Prilosec over-the-counter.  4.  Lorcet 7.5/500 mg b.i.d.  5.  ASA 81 mg daily.   SOCIAL HISTORY:  The patient lives in [place] with his spouse, and he is  currently disabled.  The  patient smokes anywhere from one-half pack to 1-1/2  packs of cigarettes per day and has smoked for 35 years.  The patient also  states that he drinks socially, usually on the weekend.  The patient states  he has not been able to exercise since November of last year but used to  exercise quite frequently before that.  The patient also states that he  tries to eat healthy but does consume approximately two liters of soft  drinks per day.  The patient also admits to using marijuana occasionally.  The patient denies herbal medication use.   FAMILY HISTORY:  Mother is alive at age 55 with diabetes mellitus.  Father  is deceased at age 40 due to stroke.  The patient has two sisters, one with  diabetes mellitus and one brother that is okay.  However, the patient states  he is the healthiest of the siblings.   REVIEW OF SYSTEMS:  Positive for weight changes, headache, 1/10 chest pain,  some sight shortness of breath, some slight dyspnea on exertion, presyncopal  episode, cough, severe arthritis, some diarrhea, positive for right-sided  abdominal pain and positive for cold intolerance.  All other systems  negative.   PHYSICAL EXAMINATION:  VITAL SIGNS:  Temperature of 98.2, pulse of 70,  respiratory rate of  16, blood pressure 106/72, saturation 100% on room air.  GENERAL:  The patient is in no obvious distress.  HEENT:  Pupils equal, round, and reactive to light bilaterally, EOMI,  sclerae clear.  Dentition:  The patient has his own teeth.  No erythema or  exudate noted.  NECK:  Supple without lymphadenopathy, TMG, bruit or JVD.  CARDIOVASCULAR:  Regular rate and rhythm, S1, S2, without murmurs, rubs or  gallops.  LUNGS:  Clear to auscultation bilaterally without wheezes, rhonchi or rales.  SKIN:  No rash or lesions.  ABDOMEN:  Soft, nontender, no rebound or guarding.  EXTREMITIES:  No cyanosis, clubbing or edema.  No rashes, lesions or  petechiae.  MUSCULOSKELETAL:  The patient has previous fusions in the C-spine.  NEUROLOGIC:  The patient is alert and oriented x3.  Cranial nerves II-XII  grossly intact.  Strength is 5/5 throughout all extremities.   Chest x-ray shows no active cardiopulmonary disease.  EKG shows a sinus  bradycardia with left axis deviation and nonspecific T-wave abnormalities.  Labs show a white blood cell count of 9.2, hemoglobin of 15.6, hematocrit of  44.7, platelet count of 195.  Sodium of 138, potassium 3.6, chloride of 101,  BUN of 6, creatinine of 1.0, glucose of 87.  Point of care markers are  negative x1.  Magnesium is 2.3.  PT is 29.   ASSESSMENT AND PLAN:  1.  Questionable transient ischemic attack.  We will obtain a CT scan.  If      the CT scan is positive, we will obtain a neurology consult.  2.  Atypical chest pain.  We will admit the patient, rule out MI, cycle      enzymes, do carotid Dopplers and if the enzymes are negative, then we      will do an outpatient Cardiolite study.  If positive, we would do a      catheterization.  3.  Lipid status.  We need to check hepatic and lipid panel.  If needed, we      will start a statin.  4.  We will check a thyroid status.  5.  Tobacco abuse.  We need smoking  cessation.  ______________________________  April Humphrey, NP      Vida Roller, M.D.  Electronically Signed    AH/MEDQ  D:  07/22/2005  T:  07/22/2005  Job:  [id number]   cc:   Delaney Meigs, M.D.  [address]  [place]  [place] [address]  Fax: [phone]

## 2011-04-18 NOTE — H&P (Signed)
NAME:  Brandon Pratt, HEAP NO.:  [id number]   MEDICAL RECORD NO.:  [id number]          PATIENT TYPE:  INP   LOCATION:                               FACILITY:  [facility]   PHYSICIAN:  Sharolyn Douglas, M.D.        DATE OF BIRTH:  December 03, 1955   DATE OF ADMISSION:  10/10/2004  DATE OF DISCHARGE:                                HISTORY & PHYSICAL   This gentleman is seen in our office on October 02, 2004 for a preoperative  history and physical for admission to [facility].  He is fitted today  with a soft, cervical collar which will be used postoperatively.   CHIEF COMPLAINT:  Pain in my neck as well as upper arms, more so in the  left than the right.   HISTORY OF PRESENT ILLNESS:  This is a 55 year old white male with a long  history of neck problems.  This has been going on since the 90s.  He has  undergone previous surgery for his cervical pain.  The first one done by Dr.  Newell Coral, and the second procedure by Dr. Excell Seltzer who is in [place], [place].  X-rays have shown a solid fusion at C4 to 5.  Nudelman extended his fusion  to 6 and 7 and removed the plate done previously.  The patient now is left  with numbness and tingling down his left arm.  He has had multiple surgeries  on his wrist with three carpal tunnel releases, two on the right and one on  the left.  Due to his multifactorial problem, Dr. Ranell Patrick had seen him for  shoulder pain and referred to [place] for more definitive treatment.  The patient  now is left with limitation with cervical range of motion.  Extension and  rotation of the left markedly increases his discomfort.  He has lost some  strength in the left triceps and left wrist extensors with 4/5.  Also, some  sensory deficit in the third, fourth, fifth digits of the left hand.  X-rays  in the office showed advanced degenerative changes with large anterior  osteophyte at C6,7.  CT myelogram has shown a broad base disk protrusion  extending to the left paracentral  along with spurring.  This contributes to  foraminal narrowing and spinal stenosis at that level.  C6,7 is the  problematic area.  After much discussion concerning the risks and benefits  of surgery, it was decided the patient would benefit with surgical  intervention.  He has agreed to go ahead with surgery at this time.   PAST MEDICAL HISTORY:  The patient is currently being treated by Dr. Joette Catching, phone number [phone].  He had been treated for frequent headaches,  anxiety.  He is short of breath secondary to his smoking.  He also has  hypertension, reflux disease.   CURRENT MEDICATIONS:  1.  HCTZ  25 mg 1/2 tablet daily.  2.  Atenolol 50 mg one 1/2 tablet daily.  3.  Mobic 15 mg one tablet daily (will stop 5 days prior to surgery).  4.  Lexapro 20 mg one daily.  5.  Hydrocodone for discomfort.   ALLERGIES:  CODEINE which primarily gives him gastroenterology problems.   Past surgeries include two cervical spinal fusions, three carpal tunnels,  one caudal nerve transfer.   Family history is positive for heart disease and hypertension in the father;  diabetes in the mother.   SOCIAL HISTORY:  The patient is married, he is retired, smokes 3-4 packs of  cigarettes per day.  Drinks socially.  He has two children.  His wife and  mother will be caregiver after surgery.   REVIEW OF SYSTEMS:  CNS:  No seizure disorder or paralysis.  The patient has  numbness and tingling as mentioned in present illness above, primarily in  the left lower extremity.  CARDIOVASCULAR:  The patient has anxiety type  chest pain, but no true angina.  He has shortness of breath which he  attributes to his smoking.  RESPIRATORY:  No productive cough.  No  hemoptysis.  GI:  No nausea, vomiting, melena, bloody stool.  GENITOURINARY:  No discharge, dysuria, hematuria.  MUSCULOSKELETAL:  Primarily in present  illness.   PHYSICAL EXAMINATION:  GENERAL:  Alert, cooperative, friendly 55 year old  white  male who is accompanied by his wife.  VITAL SIGNS:  Blood pressure 144/84, pulse 60, respirations 12.  HEENT:  Normocephalic.  PERRLA.  EOMI.  Oropharynx is clear.  CHEST:  Clear to auscultation.  No rhonchi.  No rales.  HEART:  Regular rate and rhythm.  No murmurs are heard.  ABDOMEN:  Soft, nontender.  Liver or spleen not felt.  GENITALIA:  Rectal not done, not pertinent to present illness.  EXTREMITIES:  As in present illness above.   ADMISSION DIAGNOSIS:  1.  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  2.  Hypertension.  3.  Gastroesophageal reflux disease.   PLAN:  The patient will under removal of plate at B1,4 with exploration of  fusion, anterior cervical diskectomy and fusion in C6,7 with anterior iliac  crest bone graft and plate.       ________________________________________  Druscilla Brownie. Cherlynn June.  ___________________________________________  Sharolyn Douglas, M.D.    DLU/MEDQ  D:  10/03/2004  T:  10/03/2004  Job:  [id number]   cc:   Delaney Meigs, M.D.  [address]  [place]  [place] [address]  Fax: [phone]

## 2011-04-18 NOTE — Discharge Summary (Signed)
Brandon Pratt, Brandon Pratt                 ACCOUNT NO.:  [id number]   MEDICAL RECORD NO.:  [id number]          PATIENT TYPE:  INP   LOCATION:  6523                         FACILITY:  [facility]   PHYSICIAN:  Maple Mirza, P.A. DATE OF BIRTH:  Apr 03, 1956   DATE OF ADMISSION:  07/22/2005  DATE OF DISCHARGE:                                 DISCHARGE SUMMARY   ADDENDUM:  This addendum involves neurology diagnosis for the patient's  concerns and symptoms of headache. He says that his headache is recent onset  of the last 2-3 months. He says the headache involves the left side. He says  that at the time he gets the headache he does have fasciculations right in  the lower oral orbit and also a feeling that his face is drawing. Also at  the time of the headache, he does have left neck pain and also a fair amount  of visual blurring and photophobia. The neurology workup which was done on  August 24 says that this headache is possibly an atypical migraine,  diagnosis code 346.20. The patient says that this is the primary symptom  that he is having at present and it is our thought that perhaps he could  benefit from an approach taking migraine headache into account. Recommending  that this approach be investigated on office visit with Dr. Lysbeth Galas. The  patient discharged on the following medications:   1.  Lexapro 20 mg daily.  2.  Prilosec 20 mg daily.  3.  Aspirin 81 mg daily.  4.  For insomnia here, he was given Ambien CR 12.5 mg 1 tablet at bedtime as      needed.  5.  For anxiety Xanax 0.25 mg 1 tab every 8 hours as needed.   Once again he is to call Dr. Joyce Copa office to arrange followup.      Maple Mirza, P.A.     GM/MEDQ  D:  07/25/2005  T:  07/26/2005  Job:  [id number]   cc:   Delaney Meigs, M.D.  [address]  [place]  [place] [address]  Fax: [phone]   Vida Roller, M.D.  Fax: [phone]

## 2011-07-15 ENCOUNTER — Encounter (INDEPENDENT_AMBULATORY_CARE_PROVIDER_SITE_OTHER): Payer: Self-pay | Admitting: Ophthalmology

## 2011-07-16 ENCOUNTER — Encounter (INDEPENDENT_AMBULATORY_CARE_PROVIDER_SITE_OTHER): Payer: Medicare Other | Admitting: Ophthalmology

## 2011-07-16 DIAGNOSIS — H35039 Hypertensive retinopathy, unspecified eye: Secondary | ICD-10-CM

## 2011-07-16 DIAGNOSIS — H43819 Vitreous degeneration, unspecified eye: Secondary | ICD-10-CM

## 2011-07-16 DIAGNOSIS — H348392 Tributary (branch) retinal vein occlusion, unspecified eye, stable: Secondary | ICD-10-CM

## 2011-08-13 ENCOUNTER — Encounter (INDEPENDENT_AMBULATORY_CARE_PROVIDER_SITE_OTHER): Payer: Medicare Other | Admitting: Ophthalmology

## 2011-08-13 DIAGNOSIS — H43819 Vitreous degeneration, unspecified eye: Secondary | ICD-10-CM

## 2011-08-13 DIAGNOSIS — H35039 Hypertensive retinopathy, unspecified eye: Secondary | ICD-10-CM

## 2011-08-13 DIAGNOSIS — H348392 Tributary (branch) retinal vein occlusion, unspecified eye, stable: Secondary | ICD-10-CM

## 2011-09-08 ENCOUNTER — Encounter (INDEPENDENT_AMBULATORY_CARE_PROVIDER_SITE_OTHER): Payer: Medicare Other | Admitting: Ophthalmology

## 2011-09-08 DIAGNOSIS — H43819 Vitreous degeneration, unspecified eye: Secondary | ICD-10-CM

## 2011-09-08 DIAGNOSIS — H348392 Tributary (branch) retinal vein occlusion, unspecified eye, stable: Secondary | ICD-10-CM

## 2011-09-08 DIAGNOSIS — H2510 Age-related nuclear cataract, unspecified eye: Secondary | ICD-10-CM

## 2011-09-08 DIAGNOSIS — H35039 Hypertensive retinopathy, unspecified eye: Secondary | ICD-10-CM

## 2011-10-08 ENCOUNTER — Encounter (INDEPENDENT_AMBULATORY_CARE_PROVIDER_SITE_OTHER): Payer: Medicare Other | Admitting: Ophthalmology

## 2011-10-08 DIAGNOSIS — H35039 Hypertensive retinopathy, unspecified eye: Secondary | ICD-10-CM

## 2011-10-08 DIAGNOSIS — H348392 Tributary (branch) retinal vein occlusion, unspecified eye, stable: Secondary | ICD-10-CM

## 2011-10-08 DIAGNOSIS — H43819 Vitreous degeneration, unspecified eye: Secondary | ICD-10-CM

## 2011-11-12 ENCOUNTER — Encounter (INDEPENDENT_AMBULATORY_CARE_PROVIDER_SITE_OTHER): Payer: Medicare Other | Admitting: Ophthalmology

## 2011-11-12 DIAGNOSIS — H43819 Vitreous degeneration, unspecified eye: Secondary | ICD-10-CM

## 2011-11-12 DIAGNOSIS — H2510 Age-related nuclear cataract, unspecified eye: Secondary | ICD-10-CM

## 2011-11-12 DIAGNOSIS — H35039 Hypertensive retinopathy, unspecified eye: Secondary | ICD-10-CM

## 2011-11-12 DIAGNOSIS — I10 Essential (primary) hypertension: Secondary | ICD-10-CM

## 2011-11-12 DIAGNOSIS — H348392 Tributary (branch) retinal vein occlusion, unspecified eye, stable: Secondary | ICD-10-CM

## 2011-12-31 ENCOUNTER — Encounter (INDEPENDENT_AMBULATORY_CARE_PROVIDER_SITE_OTHER): Payer: Medicare Other | Admitting: Ophthalmology

## 2011-12-31 DIAGNOSIS — H43819 Vitreous degeneration, unspecified eye: Secondary | ICD-10-CM

## 2011-12-31 DIAGNOSIS — H348392 Tributary (branch) retinal vein occlusion, unspecified eye, stable: Secondary | ICD-10-CM

## 2011-12-31 DIAGNOSIS — H35039 Hypertensive retinopathy, unspecified eye: Secondary | ICD-10-CM

## 2011-12-31 DIAGNOSIS — H2510 Age-related nuclear cataract, unspecified eye: Secondary | ICD-10-CM

## 2011-12-31 DIAGNOSIS — I10 Essential (primary) hypertension: Secondary | ICD-10-CM

## 2012-01-01 ENCOUNTER — Encounter: Payer: Self-pay | Admitting: *Deleted

## 2012-01-02 ENCOUNTER — Ambulatory Visit (INDEPENDENT_AMBULATORY_CARE_PROVIDER_SITE_OTHER): Payer: Medicare Other | Admitting: Cardiovascular Disease

## 2012-01-02 ENCOUNTER — Encounter: Payer: Self-pay | Admitting: Cardiovascular Disease

## 2012-01-02 VITALS — BP 130/87 | HR 78 | Ht 70.0 in | Wt 223.0 lb

## 2012-01-02 DIAGNOSIS — E785 Hyperlipidemia, unspecified: Secondary | ICD-10-CM

## 2012-01-02 DIAGNOSIS — Z951 Presence of aortocoronary bypass graft: Secondary | ICD-10-CM

## 2012-01-02 DIAGNOSIS — F17200 Nicotine dependence, unspecified, uncomplicated: Secondary | ICD-10-CM

## 2012-01-02 DIAGNOSIS — I2510 Atherosclerotic heart disease of native coronary artery without angina pectoris: Secondary | ICD-10-CM

## 2012-01-02 NOTE — Progress Notes (Signed)
HPI  This is a 56 year old man who is here today for a followup visit. He has no history of coronary artery disease status post coronary artery bypass graft surgery in January of 2011. No cardiac events since then. This is his annual visit. He denies any chest pain or significant dyspnea. Unfortunately, he continues to smoke 2 packs per day and has been unable to quit. He mentions that every time he tries to quit he starts fighting with his wife. His depression has improved. He has been taking his medications regularly. He is disabled due to multiple back and neck surgeries.  Allergies  Allergen Reactions  . Codeine     REACTION: Upset stomach     Current Outpatient Prescriptions on File Prior to Visit  Medication Sig Dispense Refill  . ALPRAZolam (XANAX) 0.25 MG tablet Take 0.25 mg by mouth at bedtime as needed.      Marland Kitchen aspirin EC 81 MG tablet Take 81 mg by mouth daily.      . cyclobenzaprine (FLEXERIL) 10 MG tablet Take 10 mg by mouth 3 (three) times daily as needed.      Marland Kitchen lisinopril (PRINIVIL,ZESTRIL) 20 MG tablet Take 20 mg by mouth daily.      Marland Kitchen omeprazole (PRILOSEC) 20 MG capsule Take 20 mg by mouth 2 (two) times daily.      . pravastatin (PRAVACHOL) 40 MG tablet Take 40 mg by mouth daily.      . sildenafil (VIAGRA) 50 MG tablet Take 50 mg by mouth daily as needed.         Past Medical History  Diagnosis Date  . Shortness of breath   . Chest pain, unspecified   . Depressive disorder, not elsewhere classified   . Esophageal reflux   . Obesity, unspecified   . Other malaise and fatigue   . Chronic pain syndrome   . Unspecified essential hypertension   . Other and unspecified hyperlipidemia   . Encounter for long-term (current) use of other medications   . CAD (coronary artery disease)   . Tobacco abuse      Past Surgical History  Procedure Date  . Cervical spine surgery     with fusions  . Right shoulder surgery   . Left shoulder surgery   . Appendectomy   . Hand  surgery     BILATERAL  . Coronary artery bypass graft 12/2009    (left internal mammary artery graft to the left anterior descending coronary artery, with a  saphenous vein graft to the diagonal branch of the LAD, a saphenous veingraft to the second obtuse marginal branch of the left circumflexcoronary artery, and a sequential saphenous vein graft to the acutemarginal and distal right coronary artery.)        Family History  Problem Relation Age of Onset  . Hypertension Mother   . Diabetes Mother   . Other Mother     pacemaker  . Stroke Father      History   Social History  . Marital Status: Married    Spouse Name: N/A    Number of Children: N/A  . Years of Education: N/A   Occupational History  .      Disabled   Social History Main Topics  . Smoking status: Current Everyday Smoker -- 2.0 packs/day for 50 years    Types: Cigarettes  . Smokeless tobacco: Not on file  . Alcohol Use: Yes     drinks whiskey on the weekends  . Drug Use: Yes  smokes POT occasionally to help with pain  . Sexually Active: Not on file   Other Topics Concern  . Not on file   Social History Narrative  . No narrative on file      PHYSICAL EXAM   BP 130/87  Pulse 78  Ht 5\' 10"  (1.778 m)  Wt 223 lb (101.152 kg)  BMI 32.00 kg/m2 Constitutional: He is oriented to person, place, and time. He appears well-developed and well-nourished. No distress.  HENT: No nasal discharge.  Head: Normocephalic and atraumatic.  Eyes: Pupils are equal and round. Right eye exhibits no discharge. Left eye exhibits no discharge.  Neck: Normal range of motion. Neck supple. No JVD present. No thyromegaly present.  Cardiovascular: Normal rate, regular rhythm, normal heart sounds and. Exam reveals no gallop and no friction rub. No murmur heard.  Pulmonary/Chest: Effort normal and breath sounds normal. No stridor. No respiratory distress. He has no wheezes. He has no rales. He exhibits no tenderness.    Abdominal: Soft. Bowel sounds are normal. He exhibits no distension. There is no tenderness. There is no rebound and no guarding.  Musculoskeletal: Normal range of motion. He exhibits no edema and no tenderness.  Neurological: He is alert and oriented to person, place, and time. Coordination normal.  Skin: Skin is warm and dry. No rash noted. He is not diaphoretic. No erythema. No pallor.  Psychiatric: He has a normal mood and affect. His behavior is normal. Judgment and thought content normal.      EKG: Normal sinus rhythm with left anterior fascicular block and right bundle branch block. This is unchanged from his previous ECG.   ASSESSMENT AND PLAN

## 2012-01-02 NOTE — Assessment & Plan Note (Signed)
The patient seems to be overall stable. He has no symptoms suggestive of angina. Continue medical therapy.

## 2012-01-02 NOTE — Assessment & Plan Note (Signed)
I again discussed with him the importance of smoking cessation. Unfortunately, he is not able to quit at this time.

## 2012-01-02 NOTE — Assessment & Plan Note (Signed)
He is being treated with pravastatin 40 mg daily. He informs me that He is going to have his lipid profile checked with his primary care physician in March. I recommend a target LDL of less than 70 and an HDL above 45.

## 2012-01-02 NOTE — Patient Instructions (Signed)
Continue all current medications. Your physician wants you to follow up in:  1 year.  You will receive a reminder letter in the mail one-two months in advance.  If you don't receive a letter, please call our office to schedule the follow up appointment   

## 2012-02-17 DIAGNOSIS — R079 Chest pain, unspecified: Secondary | ICD-10-CM

## 2012-02-19 ENCOUNTER — Encounter (INDEPENDENT_AMBULATORY_CARE_PROVIDER_SITE_OTHER): Payer: Medicare Other | Admitting: Ophthalmology

## 2012-03-08 ENCOUNTER — Ambulatory Visit: Payer: Medicare Other | Admitting: Cardiology

## 2012-12-01 HISTORY — PX: CARDIAC CATHETERIZATION: SHX172

## 2012-12-27 ENCOUNTER — Encounter: Payer: Self-pay | Admitting: Physician Assistant

## 2012-12-27 ENCOUNTER — Ambulatory Visit (INDEPENDENT_AMBULATORY_CARE_PROVIDER_SITE_OTHER): Payer: Medicare Other | Admitting: Physician Assistant

## 2012-12-27 ENCOUNTER — Encounter: Payer: Self-pay | Admitting: *Deleted

## 2012-12-27 ENCOUNTER — Telehealth: Payer: Self-pay | Admitting: Physician Assistant

## 2012-12-27 VITALS — BP 119/77 | HR 67 | Ht 70.0 in | Wt 202.8 lb

## 2012-12-27 DIAGNOSIS — I2510 Atherosclerotic heart disease of native coronary artery without angina pectoris: Secondary | ICD-10-CM

## 2012-12-27 DIAGNOSIS — G894 Chronic pain syndrome: Secondary | ICD-10-CM

## 2012-12-27 DIAGNOSIS — Z79899 Other long term (current) drug therapy: Secondary | ICD-10-CM

## 2012-12-27 DIAGNOSIS — I10 Essential (primary) hypertension: Secondary | ICD-10-CM

## 2012-12-27 DIAGNOSIS — E785 Hyperlipidemia, unspecified: Secondary | ICD-10-CM

## 2012-12-27 MED ORDER — ASPIRIN EC 81 MG PO TBEC: 81.0000 mg | DELAYED_RELEASE_TABLET | Freq: Every day | ORAL | Status: AC

## 2012-12-27 NOTE — Assessment & Plan Note (Signed)
Well-controlled on current medication regimen 

## 2012-12-27 NOTE — Progress Notes (Signed)
Primary Cardiologist: Mady Gemma, MD   HPI: Patient presents for evaluation of chest pain. He was last seen here in February 2013, by Dr. Kirke Corin.  Patient presents reporting marked stressors at home: His mother recently moved in, and he has to take care of his wife who has been diagnosed with bipolar disorder. He never knows what to expect when he wakes up in the morning.  Regarding symptoms, he has been living with chest "pressure" for several months. This is always precipitated by stress, not by exertion. Episodes typically last 15 minutes in duration. He was recently referred for an EKG, by his primary practitioner, and this revealed NSR with no acute changes. Labs were not ordered.  When asked why he didn't go to the ER, he reported that he "wanted to die".  Allergies  Allergen Reactions  . Codeine     REACTION: Upset stomach    Current Outpatient Prescriptions  Medication Sig Dispense Refill  . lisinopril (PRINIVIL,ZESTRIL) 20 MG tablet Take 20 mg by mouth daily.      Marland Kitchen LORazepam (ATIVAN) 1 MG tablet Take 1 mg by mouth at bedtime as needed.      Marland Kitchen omeprazole (PRILOSEC) 20 MG capsule Take 20 mg by mouth 2 (two) times daily.      . pravastatin (PRAVACHOL) 40 MG tablet Take 40 mg by mouth daily.      . sildenafil (VIAGRA) 50 MG tablet Take 50 mg by mouth daily as needed.      Marland Kitchen aspirin EC 81 MG tablet Take 1 tablet (81 mg total) by mouth daily.        Past Medical History  Diagnosis Date  . Shortness of breath   . Chest pain, unspecified   . Depressive disorder, not elsewhere classified   . Esophageal reflux   . Obesity, unspecified   . Other malaise and fatigue   . Chronic pain syndrome   . Unspecified essential hypertension   . Other and unspecified hyperlipidemia   . Encounter for long-term (current) use of other medications   . CAD (coronary artery disease)   . Tobacco abuse     Past Surgical History  Procedure Date  . Cervical spine surgery     with fusions  .  Right shoulder surgery   . Left shoulder surgery   . Appendectomy   . Hand surgery     BILATERAL  . Coronary artery bypass graft 12/2009    (left internal mammary artery graft to the left anterior descending coronary artery, with a  saphenous vein graft to the diagonal branch of the LAD, a saphenous veingraft to the second obtuse marginal branch of the left circumflexcoronary artery, and a sequential saphenous vein graft to the acutemarginal and distal right coronary artery.)       History   Social History  . Marital Status: Married    Spouse Name: N/A    Number of Children: N/A  . Years of Education: N/A   Occupational History  .      Disabled   Social History Main Topics  . Smoking status: Current Every Day Smoker -- 2.0 packs/day for 50 years    Types: Cigarettes  . Smokeless tobacco: Not on file  . Alcohol Use: Yes     Comment: drinks whiskey on the weekends  . Drug Use: Yes     Comment: smokes POT occasionally to help with pain  . Sexually Active: Not on file   Other Topics Concern  . Not on  file   Social History Narrative  . No narrative on file    Family History  Problem Relation Age of Onset  . Hypertension Mother   . Diabetes Mother   . Other Mother     pacemaker  . Stroke Father     ROS: no nausea, vomiting; no fever, chills; no melena, hematochezia; no claudication  PHYSICAL EXAM: BP 119/77  Pulse 67  Ht 5\' 10"  (1.778 m)  Wt 202 lb 12.8 oz (91.989 kg)  BMI 29.10 kg/m2 GENERAL: 57 year old male; NAD HEENT: NCAT, PERRLA, EOMI; sclera clear; no xanthelasma NECK: palpable bilateral carotid pulses, no bruits; no JVD; no TM LUNGS: CTA bilaterally CARDIAC: RRR (S1, S2); no significant murmurs; no rubs or gallops ABDOMEN: soft, non-tender; intact BS EXTREMETIES: no significant peripheral edema SKIN: warm/dry; no obvious rash/lesions MUSCULOSKELETAL: no joint deformity NEURO: no focal deficit; NL affect   EKG: reviewed and available in Electronic  Records   ASSESSMENT & PLAN:  CHRONIC PAIN SYNDROME Patient's symptoms are atypical, always precipitated by stress and not by exertion, and have been occurring daily for several months. A recent EKG indicated NSR with no definite ischemic changes. Labs were not drawn. He is now 3 years out since undergoing CABG, and has not had subsequent ischemic evaluation. Therefore, I recommended a GXT Cardiolite for risk stratification. If this is negative for ischemia, then patient can be reassured that his symptoms are noncardiac, and he can return in one year. If, however, there is suggestion of ischemia, then we will have patient return for review of results and further recommendations. Of note, he continues to smoke and I emphasized the importance of complete cessation. I also told him that I understood that he is experiencing significant domestic stressors at home, at present time.  HYPERLIPIDEMIA Will reassess lipid status. Aggressive management recommended with target LDL 70 or less, if feasible. Continue current dose Pravachol, pending further recommendations.  HYPERTENSION Well-controlled on current medication regimen    Gene Rody Keadle, PAC

## 2012-12-27 NOTE — Telephone Encounter (Signed)
GXT Cardiolite (exercise stress test) Weight 202  Wednesday, January 29 th @ [facility], [place] Checking percert

## 2012-12-27 NOTE — Assessment & Plan Note (Signed)
Patient's symptoms are atypical, always precipitated by stress and not by exertion, and have been occurring daily for several months. A recent EKG indicated NSR with no definite ischemic changes. Labs were not drawn. He is now 3 years out since undergoing CABG, and has not had subsequent ischemic evaluation. Therefore, I recommended a GXT Cardiolite for risk stratification. If this is negative for ischemia, then patient can be reassured that his symptoms are noncardiac, and he can return in one year. If, however, there is suggestion of ischemia, then we will have patient return for review of results and further recommendations. Of note, he continues to smoke and I emphasized the importance of complete cessation. I also told him that I understood that he is experiencing significant domestic stressors at home, at present time.

## 2012-12-27 NOTE — Assessment & Plan Note (Signed)
Will reassess lipid status. Aggressive management recommended with target LDL 70 or less, if feasible. Continue current dose Pravachol, pending further recommendations.

## 2012-12-27 NOTE — Patient Instructions (Signed)
   Decrease Aspirin to 81mg  daily  GXT Cardiolite (exercise stress test)  Labs for fasting lipid and liver panel  Office will notify of results Continue all other current medications. Your physician wants you to follow up in:  1 year.  You will receive a reminder letter in the mail one-two months in advance.  If you don't receive a letter, please call our office to schedule the follow up appointment

## 2012-12-28 NOTE — Telephone Encounter (Signed)
Auth # [id number] exp 02/11/13

## 2012-12-29 DIAGNOSIS — R079 Chest pain, unspecified: Secondary | ICD-10-CM

## 2013-01-03 ENCOUNTER — Telehealth: Payer: Self-pay | Admitting: *Deleted

## 2013-01-03 NOTE — Telephone Encounter (Signed)
Notes Recorded by Lesle Chris, LPN on 12/06/1094 at 10:05 AM Patient notified. OV scheduled for Friday, 2/7 with Gene Serpe, PA.

## 2013-01-03 NOTE — Telephone Encounter (Signed)
Message copied by Lesle Chris on Mon Jan 03, 2013 10:05 AM ------      Message from: Prescott Parma C      Created: Thu Dec 30, 2012 12:06 PM       Possible mild inferior ischemia; NL LVF. Recommend pt return for early f/u next week to discuss possible cath.

## 2013-01-07 ENCOUNTER — Telehealth: Payer: Self-pay | Admitting: Physician Assistant

## 2013-01-07 ENCOUNTER — Encounter: Payer: Self-pay | Admitting: Physician Assistant

## 2013-01-07 ENCOUNTER — Ambulatory Visit (INDEPENDENT_AMBULATORY_CARE_PROVIDER_SITE_OTHER): Payer: Medicare Other | Admitting: Physician Assistant

## 2013-01-07 ENCOUNTER — Encounter: Payer: Self-pay | Admitting: *Deleted

## 2013-01-07 ENCOUNTER — Other Ambulatory Visit: Payer: Self-pay | Admitting: Physician Assistant

## 2013-01-07 VITALS — BP 132/86 | HR 67 | Ht 70.0 in | Wt 204.4 lb

## 2013-01-07 DIAGNOSIS — Z01810 Encounter for preprocedural cardiovascular examination: Secondary | ICD-10-CM

## 2013-01-07 DIAGNOSIS — R079 Chest pain, unspecified: Secondary | ICD-10-CM

## 2013-01-07 DIAGNOSIS — I10 Essential (primary) hypertension: Secondary | ICD-10-CM

## 2013-01-07 DIAGNOSIS — E785 Hyperlipidemia, unspecified: Secondary | ICD-10-CM

## 2013-01-07 DIAGNOSIS — F17200 Nicotine dependence, unspecified, uncomplicated: Secondary | ICD-10-CM

## 2013-01-07 DIAGNOSIS — I2510 Atherosclerotic heart disease of native coronary artery without angina pectoris: Secondary | ICD-10-CM | POA: Insufficient documentation

## 2013-01-07 MED ORDER — METOPROLOL TARTRATE 25 MG PO TABS: 25.0000 mg | ORAL_TABLET | Freq: Two times a day (BID) | ORAL | Status: DC

## 2013-01-07 NOTE — Assessment & Plan Note (Signed)
I once again emphasized the importance of complete smoking cessation, acknowledging that he continues to experience significant stressors at home.

## 2013-01-07 NOTE — Progress Notes (Signed)
Primary Cardiologist: Mady Gemma, MD    HPI: Patient returns for review of abnormal GXT Cardiolite, which I ordered during recent OV for evaluation of CP.   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 Kathlee Nations Trigt, and has been experiencing nonexertional CP, precipitated by "stress", for several months. He had not had an ischemic evaluation since undergoing CABG.   -GXT Cardiolite, January 29 (98% MPHR): Partially reversible inferior defect; EF 66%  Patient denies any interim worsening of his intermittent CP, since last OV. He again indicates that this is precipitated only by "stress", typically related to his wife who has bipolar disorder. He does report some mild DOE, but with no associated CP. Unfortunately, he continues to smoke at least 1 ppd.  Allergies  Allergen Reactions  . Codeine     REACTION: Upset stomach    Current Outpatient Prescriptions  Medication Sig Dispense Refill  . aspirin EC 81 MG tablet Take 1 tablet (81 mg total) by mouth daily.      Marland Kitchen lisinopril (PRINIVIL,ZESTRIL) 20 MG tablet Take 20 mg by mouth daily.      Marland Kitchen LORazepam (ATIVAN) 1 MG tablet Take 1 mg by mouth at bedtime as needed.      . nitroGLYCERIN (NITROSTAT) 0.4 MG SL tablet Place 0.4 mg under the tongue every 5 (five) minutes as needed.      Marland Kitchen omeprazole (PRILOSEC) 20 MG capsule Take 20 mg by mouth 2 (two) times daily.      . pravastatin (PRAVACHOL) 40 MG tablet Take 40 mg by mouth every evening.       . sildenafil (VIAGRA) 50 MG tablet Take 50 mg by mouth daily as needed.        Past Medical History  Diagnosis Date  . Shortness of breath   . Chest pain, unspecified   . Depressive disorder, not elsewhere classified   . Esophageal reflux   . Obesity, unspecified   . Other malaise and fatigue   . Chronic pain syndrome   . Unspecified essential hypertension   . Other and unspecified hyperlipidemia   . Encounter for long-term (current) use of other medications   . CAD (coronary artery  disease)   . Tobacco abuse     Past Surgical History  Procedure Date  . Cervical spine surgery     with fusions  . Right shoulder surgery   . Left shoulder surgery   . Appendectomy   . Hand surgery     BILATERAL  . Coronary artery bypass graft 12/2009    (left internal mammary artery graft to the left anterior descending coronary artery, with a  saphenous vein graft to the diagonal branch of the LAD, a saphenous veingraft to the second obtuse marginal branch of the left circumflexcoronary artery, and a sequential saphenous vein graft to the acutemarginal and distal right coronary artery.)       History   Social History  . Marital Status: Married    Spouse Name: N/A    Number of Children: N/A  . Years of Education: N/A   Occupational History  .      Disabled   Social History Main Topics  . Smoking status: Current Every Day Smoker -- 1.0 packs/day for 50 years    Types: Cigarettes  . Smokeless tobacco: Not on file  . Alcohol Use: Yes     Comment: drinks whiskey on the weekends  . Drug Use: Yes     Comment: smokes POT occasionally to help  with pain  . Sexually Active: Not on file   Other Topics Concern  . Not on file   Social History Narrative  . No narrative on file    Family History  Problem Relation Age of Onset  . Hypertension Mother   . Diabetes Mother   . Other Mother     pacemaker  . Stroke Father     ROS: no nausea, vomiting; no fever, chills; no melena, hematochezia; no claudication  PHYSICAL EXAM: BP 132/86  Pulse 67  Ht 5\' 10"  (1.778 m)  Wt 204 lb 6.4 oz (92.715 kg)  BMI 29.33 kg/m2  SpO2 98% GENERAL: 57 year old male; NAD  HEENT: NCAT, PERRLA, EOMI; sclera clear; no xanthelasma  NECK: palpable bilateral carotid pulses, no bruits; no JVD; no TM  LUNGS: CTA bilaterally  CARDIAC: RRR (S1, S2); no significant murmurs; no rubs or gallops  ABDOMEN: soft, non-tender; intact BS  EXTREMETIES: no significant peripheral edema  SKIN: warm/dry; no  obvious rash/lesions  MUSCULOSKELETAL: no joint deformity  NEURO: no focal deficit; NL affect    EKG:    ASSESSMENT & PLAN:  CAD (coronary artery disease) I reviewed the results of the recent adequate GXT Cardiolite with the patient, noting the possibility of mild inferior ischemia. I offered the options of aggressive medical therapy versus diagnostic coronary angiography, and that I recommended the latter approach for definitive exclusion of significant CAD progression since his CABG. This is particularly given the fact that he continues to experience chest discomfort, which he attributes only to "stress". He agreed with this recommendation, with which Dr. Myrtis Ser also concurred. The risks/benefits were reviewed and we will proceed with scheduling of the procedure in our [facility] lab, early next week. Patient will be started on low-dose beta blocker, and NTG prescription will be renewed.  HYPERTENSION Stable on current medication regimen  TOBACCO ABUSE I once again emphasized the importance of complete smoking cessation, acknowledging that he continues to experience significant stressors at home.    Gene Estil Vallee, PAC

## 2013-01-07 NOTE — Assessment & Plan Note (Signed)
Stable on current medication regimen 

## 2013-01-07 NOTE — Patient Instructions (Signed)
   Left JV heart cath - see info sheet given  Begin Lopressor 25mg  twice a day   Continue all other current medications. Follow up will be given at time of discharge from procedure

## 2013-01-07 NOTE — Telephone Encounter (Signed)
Left JV heart cath - Tuesday, 2/11 - 7:30 - [place]  Checking percert

## 2013-01-07 NOTE — Assessment & Plan Note (Signed)
I reviewed the results of the recent adequate GXT Cardiolite with the patient, noting the possibility of mild inferior ischemia. I offered the options of aggressive medical therapy versus diagnostic coronary angiography, and that I recommended the latter approach for definitive exclusion of significant CAD progression since his CABG. This is particularly given the fact that he continues to experience chest discomfort, which he attributes only to "stress". He agreed with this recommendation, with which Dr. Myrtis Ser also concurred. The risks/benefits were reviewed and we will proceed with scheduling of the procedure in our [facility] lab, early next week. Patient will be started on low-dose beta blocker, and NTG prescription will be renewed.

## 2013-01-10 NOTE — Telephone Encounter (Signed)
Auth on file

## 2013-01-11 ENCOUNTER — Inpatient Hospital Stay (HOSPITAL_BASED_OUTPATIENT_CLINIC_OR_DEPARTMENT_OTHER)
Admission: RE | Admit: 2013-01-11 | Discharge: 2013-01-11 | Disposition: A | Discharge status: Home / Self Care | Payer: Medicare Other | Source: Ambulatory Visit | Attending: Cardiovascular Disease | Admitting: Cardiovascular Disease

## 2013-01-11 ENCOUNTER — Encounter (HOSPITAL_BASED_OUTPATIENT_CLINIC_OR_DEPARTMENT_OTHER)
Admission: RE | Disposition: A | Discharge status: Home / Self Care | Payer: Self-pay | Source: Ambulatory Visit | Attending: Cardiovascular Disease

## 2013-01-11 ENCOUNTER — Encounter: Payer: Self-pay | Admitting: *Deleted

## 2013-01-11 DIAGNOSIS — R079 Chest pain, unspecified: Secondary | ICD-10-CM

## 2013-01-11 DIAGNOSIS — I2510 Atherosclerotic heart disease of native coronary artery without angina pectoris: Secondary | ICD-10-CM | POA: Insufficient documentation

## 2013-01-11 DIAGNOSIS — I25810 Atherosclerosis of coronary artery bypass graft(s) without angina pectoris: Secondary | ICD-10-CM | POA: Insufficient documentation

## 2013-01-11 DIAGNOSIS — I2582 Chronic total occlusion of coronary artery: Secondary | ICD-10-CM | POA: Insufficient documentation

## 2013-01-11 SURGERY — JV LEFT HEART CATHETERIZATION WITH CORONARY ANGIOGRAM

## 2013-01-11 MED ORDER — SODIUM CHLORIDE 0.9 % IV SOLN: INTRAVENOUS | Status: DC

## 2013-01-11 MED ORDER — ONDANSETRON HCL 4 MG/2ML IJ SOLN: 4.0000 mg | Freq: Four times a day (QID) | INTRAMUSCULAR | Status: DC | PRN

## 2013-01-11 MED ORDER — ACETAMINOPHEN 325 MG PO TABS: 650.0000 mg | ORAL_TABLET | ORAL | Status: DC | PRN

## 2013-01-11 MED ORDER — SODIUM CHLORIDE 0.9 % IV SOLN: 250.0000 mL | INTRAVENOUS | Status: DC | PRN

## 2013-01-11 MED ORDER — SODIUM CHLORIDE 0.9 % IJ SOLN: 3.0000 mL | INTRAMUSCULAR | Status: DC | PRN

## 2013-01-11 MED ORDER — SODIUM CHLORIDE 0.9 % IJ SOLN: 3.0000 mL | Freq: Two times a day (BID) | INTRAMUSCULAR | Status: DC

## 2013-01-11 MED ORDER — ASPIRIN 81 MG PO CHEW
324.0000 mg | CHEWABLE_TABLET | ORAL | Status: AC
  Administered 2013-01-11: 243 mg via ORAL

## 2013-01-11 NOTE — OR Nursing (Signed)
Dr Melburn Popper at bedside to discuss results and treatment plan with pt and family

## 2013-01-11 NOTE — OR Nursing (Signed)
Discharge instructions reviewed and signed, pt stated understanding, ambulated in hall without difficulty, site level 0, transported to wife's car via wheelchair 

## 2013-01-11 NOTE — Interval H&P Note (Signed)
History and Physical Interval Note:  01/11/2013 7:24 AM  Brandon Pratt  has presented today for surgery, with the diagnosis of cp  The various methods of treatment have been discussed with the patient and family. After consideration of risks, benefits and other options for treatment, the patient has consented to  Procedure(s): JV LEFT HEART CATHETERIZATION WITH CORONARY ANGIOGRAM (N/A) as a surgical intervention .  The patient's history has been reviewed, patient examined, no change in status, stable for surgery.  I have reviewed the patient's chart and labs.  Questions were answered to the patient's satisfaction.     Elyn Aquas.

## 2013-01-11 NOTE — OR Nursing (Signed)
Tegaderm dressing applied, site level 0, bedrest began at 0830

## 2013-01-11 NOTE — OR Nursing (Signed)
Meal served 

## 2013-01-11 NOTE — H&P (View-Only) (Signed)
Primary Cardiologist: Mohammed Arida, MD    HPI: Patient returns for review of abnormal GXT Cardiolite, which I ordered during recent OV for evaluation of CP.   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 Peter Van Trigt, and has been experiencing nonexertional CP, precipitated by "stress", for several months. He had not had an ischemic evaluation since undergoing CABG.   -GXT Cardiolite, January 29 (98% MPHR): Partially reversible inferior defect; EF 66%  Patient denies any interim worsening of his intermittent CP, since last OV. He again indicates that this is precipitated only by "stress", typically related to his wife who has bipolar disorder. He does report some mild DOE, but with no associated CP. Unfortunately, he continues to smoke at least 1 ppd.  Allergies  Allergen Reactions  . Codeine     REACTION: Upset stomach    Current Outpatient Prescriptions  Medication Sig Dispense Refill  . aspirin EC 81 MG tablet Take 1 tablet (81 mg total) by mouth daily.      . lisinopril (PRINIVIL,ZESTRIL) 20 MG tablet Take 20 mg by mouth daily.      . LORazepam (ATIVAN) 1 MG tablet Take 1 mg by mouth at bedtime as needed.      . nitroGLYCERIN (NITROSTAT) 0.4 MG SL tablet Place 0.4 mg under the tongue every 5 (five) minutes as needed.      . omeprazole (PRILOSEC) 20 MG capsule Take 20 mg by mouth 2 (two) times daily.      . pravastatin (PRAVACHOL) 40 MG tablet Take 40 mg by mouth every evening.       . sildenafil (VIAGRA) 50 MG tablet Take 50 mg by mouth daily as needed.        Past Medical History  Diagnosis Date  . Shortness of breath   . Chest pain, unspecified   . Depressive disorder, not elsewhere classified   . Esophageal reflux   . Obesity, unspecified   . Other malaise and fatigue   . Chronic pain syndrome   . Unspecified essential hypertension   . Other and unspecified hyperlipidemia   . Encounter for long-term (current) use of other medications   . CAD (coronary artery  disease)   . Tobacco abuse     Past Surgical History  Procedure Date  . Cervical spine surgery     with fusions  . Right shoulder surgery   . Left shoulder surgery   . Appendectomy   . Hand surgery     BILATERAL  . Coronary artery bypass graft 12/2009    (left internal mammary artery graft to the left anterior descending coronary artery, with a  saphenous vein graft to the diagonal branch of the LAD, a saphenous veingraft to the second obtuse marginal branch of the left circumflexcoronary artery, and a sequential saphenous vein graft to the acutemarginal and distal right coronary artery.)       History   Social History  . Marital Status: Married    Spouse Name: N/A    Number of Children: N/A  . Years of Education: N/A   Occupational History  .      Disabled   Social History Main Topics  . Smoking status: Current Every Day Smoker -- 1.0 packs/day for 50 years    Types: Cigarettes  . Smokeless tobacco: Not on file  . Alcohol Use: Yes     Comment: drinks whiskey on the weekends  . Drug Use: Yes     Comment: smokes POT occasionally to help   with pain  . Sexually Active: Not on file   Other Topics Concern  . Not on file   Social History Narrative  . No narrative on file    Family History  Problem Relation Age of Onset  . Hypertension Mother   . Diabetes Mother   . Other Mother     pacemaker  . Stroke Father     ROS: no nausea, vomiting; no fever, chills; no melena, hematochezia; no claudication  PHYSICAL EXAM: BP 132/86  Pulse 67  Ht 5' 10" (1.778 m)  Wt 204 lb 6.4 oz (92.715 kg)  BMI 29.33 kg/m2  SpO2 98% GENERAL: 56-year-old male; NAD  HEENT: NCAT, PERRLA, EOMI; sclera clear; no xanthelasma  NECK: palpable bilateral carotid pulses, no bruits; no JVD; no TM  LUNGS: CTA bilaterally  CARDIAC: RRR (S1, S2); no significant murmurs; no rubs or gallops  ABDOMEN: soft, non-tender; intact BS  EXTREMETIES: no significant peripheral edema  SKIN: warm/dry; no  obvious rash/lesions  MUSCULOSKELETAL: no joint deformity  NEURO: no focal deficit; NL affect    EKG:    ASSESSMENT & PLAN:  CAD (coronary artery disease) I reviewed the results of the recent adequate GXT Cardiolite with the patient, noting the possibility of mild inferior ischemia. I offered the options of aggressive medical therapy versus diagnostic coronary angiography, and that I recommended the latter approach for definitive exclusion of significant CAD progression since his CABG. This is particularly given the fact that he continues to experience chest discomfort, which he attributes only to "stress". He agreed with this recommendation, with which Dr. Katz also concurred. The risks/benefits were reviewed and we will proceed with scheduling of the procedure in our [facility] lab, early next week. Patient will be started on low-dose beta blocker, and NTG prescription will be renewed.  HYPERTENSION Stable on current medication regimen  TOBACCO ABUSE I once again emphasized the importance of complete smoking cessation, acknowledging that he continues to experience significant stressors at home.    Gene Shayonna Ocampo, PAC  

## 2013-01-11 NOTE — CV Procedure (Signed)
    Cardiac Cath Note  Brandon Pratt [id number] 03/19/1956  Procedure: Left  Heart Cardiac Catheterization Note Indications: hx of CAD. Recent onset of chest pain  Procedure Details Consent: Obtained Time Out: Verified patient identification, verified procedure, site/side was marked, verified correct patient position, special equipment/implants available, Radiology Safety Procedures followed,  medications/allergies/relevent history reviewed, required imaging and test results available.  Performed   Medications: Fentanyl: 50 mcg IV Versed: 2 mg IV  The right femoral artery was easily canulated using a modified Seldinger technique.  Hemodynamics:   LV pressure: 100/15 Aortic pressure: 98/55  Angiography   Left Main: mild irregularities  Left anterior Descending: occluded proximally  Left Circumflex: severe disease.  The 1st OM is large and is occluded proximally.  The mid and distal LCx is a small vessel that goes around the AV groove and is then occluded.  There is a collateral that irises from the mid LCx that feeds the distal PLSA.   Right Coronary Artery: large and dominant.  Severe disease.  Proximal 50%. Chronically occluded at mid point.  The distal RCA fills vial right to right collateral and left to right collaterals.  SVG to RCA:  Occluded at it's origen  SVG to Diag:  Large graft.  1st Diag is small to moderate in size and is unremarkable.  SVG to OM:  Large graft, anastomosis is normal .  Normal OM  LIMA to LAD:  Normal graft, LAD is small but is otherwise unremarkable.  Provides left to right collaterals to the distal RCA  LV Gram: normal LV function  Complications: No apparent complications Patient did tolerate procedure well.  Contrast used: 85 cc  Conclusions:   1.  Severe native CAD 2. Patent LIMA to LAD, SVG to OM, SVG to Diag. 3. Occluded SVG to RCA and the native RCA is chronically occluded. 4. Well preserved LV function.    Will have Dr.  [place] review the angiograms to see if he is a candidate for the new CTO PCI techniques.  He has good collateral circulation to the distal RCA for now.    Brandon Pratt, Brandon Pratt., MD, [facility] 01/11/2013, 8:30 AM Office - [phone] Pager [phone]

## 2013-01-26 ENCOUNTER — Ambulatory Visit (INDEPENDENT_AMBULATORY_CARE_PROVIDER_SITE_OTHER): Payer: Medicare Other | Admitting: Physician Assistant

## 2013-01-26 ENCOUNTER — Encounter: Payer: Self-pay | Admitting: Physician Assistant

## 2013-01-26 VITALS — BP 120/79 | HR 70 | Ht 70.0 in | Wt 208.0 lb

## 2013-01-26 DIAGNOSIS — I2510 Atherosclerotic heart disease of native coronary artery without angina pectoris: Secondary | ICD-10-CM

## 2013-01-26 NOTE — Progress Notes (Addendum)
Primary Cardiologist: Mady Gemma, MD   HPI: Status post elective catheterization followup, which I scheduled at time of recent OV on February 7, following recent abnormal GXT Cardiolite. Patient recently presented with complaint of CP, in context of multivessel CABG, 01/2010. Stress test was suggestive of partially reversible inferior defect; EF 66%.   - Cardiac catheterization, February 11: Severe native CAD; patent LIMA to LAD, SVG to OM, SVG to Diag; occluded SVG to RCA and the native RCA is chronically occluded; well preserved LV function  Dr. Elease Hashimoto indicated that he would have Dr. [place] review the films, to see if the patient would be a candidate for the new CTO PCI techniques. He also indicated that there was good collateral circulation to the distal RCA.  Patient denies any exacerbation of his baseline symptoms. He again emphasizes that his CP occurs only during periods of "stress", attributed to his wife who suffers from bipolar disorder. The episodes resolve spontaneously, but can last as long as 30 minutes in duration. He continues to report no exertional CP, and has not had to take any NTG, since undergoing CABG in 2011.  He denies any complications of R groin incision site.  Allergies  Allergen Reactions  . Codeine     REACTION: Upset stomach    Current Outpatient Prescriptions  Medication Sig Dispense Refill  . aspirin EC 81 MG tablet Take 1 tablet (81 mg total) by mouth daily.      Marland Kitchen lisinopril (PRINIVIL,ZESTRIL) 20 MG tablet Take 20 mg by mouth daily.      Marland Kitchen LORazepam (ATIVAN) 1 MG tablet Take 1 mg by mouth at bedtime as needed.      . metoprolol tartrate (LOPRESSOR) 25 MG tablet Take 1 tablet (25 mg total) by mouth 2 (two) times daily.  60 tablet  6  . nitroGLYCERIN (NITROSTAT) 0.4 MG SL tablet Place 0.4 mg under the tongue every 5 (five) minutes as needed.      Marland Kitchen omeprazole (PRILOSEC) 20 MG capsule Take 20 mg by mouth 2 (two) times daily.      . pravastatin  (PRAVACHOL) 40 MG tablet Take 40 mg by mouth every evening.       . sildenafil (VIAGRA) 50 MG tablet Take 50 mg by mouth daily as needed.       No current facility-administered medications for this visit.    Past Medical History  Diagnosis Date  . Shortness of breath   . Chest pain, unspecified   . Depressive disorder, not elsewhere classified   . Esophageal reflux   . Obesity, unspecified   . Other malaise and fatigue   . Chronic pain syndrome   . Unspecified essential hypertension   . Other and unspecified hyperlipidemia   . Encounter for long-term (current) use of other medications   . CAD (coronary artery disease)   . Tobacco abuse     Past Surgical History  Procedure Laterality Date  . Cervical spine surgery      with fusions  . Right shoulder surgery    . Left shoulder surgery    . Appendectomy    . Hand surgery      BILATERAL  . Coronary artery bypass graft  12/2009    (left internal mammary artery graft to the left anterior descending coronary artery, with a  saphenous vein graft to the diagonal branch of the LAD, a saphenous veingraft to the second obtuse marginal branch of the left circumflexcoronary artery, and a sequential saphenous vein graft to the  acutemarginal and distal right coronary artery.)       History   Social History  . Marital Status: Married    Spouse Name: N/A    Number of Children: N/A  . Years of Education: N/A   Occupational History  .      Disabled   Social History Main Topics  . Smoking status: Current Every Day Smoker -- 1.00 packs/day for 50 years    Types: Cigarettes  . Smokeless tobacco: Not on file  . Alcohol Use: Yes     Comment: drinks whiskey on the weekends  . Drug Use: Yes     Comment: smokes POT occasionally to help with pain  . Sexually Active: Not on file   Other Topics Concern  . Not on file   Social History Narrative  . No narrative on file    Family History  Problem Relation Age of Onset  . Hypertension  Mother   . Diabetes Mother   . Other Mother     pacemaker  . Stroke Father     ROS: no nausea, vomiting; no fever, chills; no melena, hematochezia; no claudication  PHYSICAL EXAM: BP 120/79  Pulse 70  Ht 5\' 10"  (1.778 m)  Wt 208 lb (94.348 kg)  BMI 29.84 kg/m2 GENERAL: 57 year old male; NAD  HEENT: NCAT, PERRLA, EOMI; sclera clear; no xanthelasma  NECK: palpable bilateral carotid pulses, no bruits; no JVD; no TM  LUNGS: CTA bilaterally  CARDIAC: RRR (S1, S2); no significant murmurs; no rubs or gallops  ABDOMEN: soft, non-tender; intact BS  EXTREMETIES: no significant peripheral edema  SKIN: warm/dry; no obvious rash/lesions  MUSCULOSKELETAL: no joint deformity  NEURO: no focal deficit; NL affect    EKG:    ASSESSMENT & PLAN:  CAD (coronary artery disease) I reviewed with him the results of his recent cardiac catheterization, noting 100% occlusion of one of his vein grafts. However, I pointed out that he has developed adequate collateral circulation to the distal RCA. LVF is well-preserved. In the absence of any exacerbation of his symptoms, or development of exertional CP, I recommend continuing current medication regimen and reassessing his clinical status in 4 months. If, however, he were to develop exertional symptoms, or progression in frequency or duration of his episodes, then he is to contact our office and we will arrange to see him sooner. I will also inquire as to whether or not he would be a suitable candidate for the new CTO PCI techniques, alluded to in his catheterization report.    Gene Teryl Mcconaghy, PAC  ADDENDUM: I communicated with Dr Peter Sw[place]and, who concluded that Mr Woodham may be a candidate for CTO PCI. However, he indicated that they are probably 3 months out from getting this started. He also felt that it may be hard to justify doing this procedure, if the patient is asymptomatic. He said he will keep him on his list, and touch base with [place] later.

## 2013-01-26 NOTE — Assessment & Plan Note (Signed)
I reviewed with him the results of his recent cardiac catheterization, noting 100% occlusion of one of his vein grafts. However, I pointed out that he has developed adequate collateral circulation to the distal RCA. LVF is well-preserved. In the absence of any exacerbation of his symptoms, or development of exertional CP, I recommend continuing current medication regimen and reassessing his clinical status in 4 months. If, however, he were to develop exertional symptoms, or progression in frequency or duration of his episodes, then he is to contact our office and we will arrange to see him sooner. I will also inquire as to whether or not he would be a suitable candidate for the new CTO PCI techniques, alluded to in his catheterization report.

## 2013-01-26 NOTE — Patient Instructions (Signed)
Continue all current medications. Follow up in  4 months  

## 2013-05-02 ENCOUNTER — Encounter: Payer: Self-pay | Admitting: Cardiology

## 2013-05-03 ENCOUNTER — Ambulatory Visit (INDEPENDENT_AMBULATORY_CARE_PROVIDER_SITE_OTHER): Payer: Medicare Other | Admitting: Cardiology

## 2013-05-03 ENCOUNTER — Encounter: Payer: Self-pay | Admitting: Cardiology

## 2013-05-03 VITALS — BP 107/71 | HR 64 | Ht 70.0 in | Wt 206.0 lb

## 2013-05-03 DIAGNOSIS — I2510 Atherosclerotic heart disease of native coronary artery without angina pectoris: Secondary | ICD-10-CM

## 2013-05-03 DIAGNOSIS — I10 Essential (primary) hypertension: Secondary | ICD-10-CM

## 2013-05-03 DIAGNOSIS — F17200 Nicotine dependence, unspecified, uncomplicated: Secondary | ICD-10-CM

## 2013-05-03 DIAGNOSIS — E785 Hyperlipidemia, unspecified: Secondary | ICD-10-CM

## 2013-05-03 NOTE — Assessment & Plan Note (Signed)
Blood pressure is normal today. 

## 2013-05-03 NOTE — Patient Instructions (Addendum)

## 2013-05-03 NOTE — Assessment & Plan Note (Signed)
Clinically stable with multivessel disease status post CABG, graft disease being managed medically at this time with documented collaterals to the native RCA. No change to current regimen. Encouraged a more regular exercise plan. Followup in 6 months.

## 2013-05-03 NOTE — Assessment & Plan Note (Signed)
Continue to recommend smoking cessation. 

## 2013-05-03 NOTE — Progress Notes (Signed)
Clinical Summary Mr. Kotarski is a 57 y.o.male presenting for office followup. He was last seen by Mr. Serpe PA-C in February, a former patient of Dr.Arida. Cardiac catheterization at that time demonstrated native vessel CAD with patent LIMA to LAD, patent SVG to OM, patent SVG to diagonal, and occluded SVG to RCA, with a chronically occluded RCA associated with left to right collaterals. He has been managed medically.  Lab work in February showed normal LFTs, triglycerides 175, cholesterol 144, HDL 36, LDL 73. He reports compliance with Pravachol.  Fortunately, he has been clinically stable from a cardiac perspective, no active angina symptoms. She is under a lot of stress, states that his wife has bipolar disorder, his mother also lives with them in the home. We talked about trying to get out for a regular exercise regimen.  Allergies  Allergen Reactions  . Codeine     REACTION: Upset stomach    Current Outpatient Prescriptions  Medication Sig Dispense Refill  . aspirin EC 81 MG tablet Take 1 tablet (81 mg total) by mouth daily.      Marland Kitchen lisinopril (PRINIVIL,ZESTRIL) 20 MG tablet Take 20 mg by mouth daily.      Marland Kitchen LORazepam (ATIVAN) 1 MG tablet Take 1 mg by mouth at bedtime as needed.      . metoprolol tartrate (LOPRESSOR) 25 MG tablet Take 1 tablet (25 mg total) by mouth 2 (two) times daily.  60 tablet  6  . nitroGLYCERIN (NITROSTAT) 0.4 MG SL tablet Place 0.4 mg under the tongue every 5 (five) minutes as needed.      Marland Kitchen omeprazole (PRILOSEC) 20 MG capsule Take 20 mg by mouth 2 (two) times daily.      . pravastatin (PRAVACHOL) 40 MG tablet Take 40 mg by mouth every evening.       . sildenafil (VIAGRA) 50 MG tablet Take 50 mg by mouth daily as needed.       No current facility-administered medications for this visit.    Past Medical History  Diagnosis Date  . Depression   . GERD (gastroesophageal reflux disease)   . Chronic pain syndrome   . Essential hypertension, benign   . Mixed  hyperlipidemia   . Coronary atherosclerosis of native coronary artery     Multivessel, occluded SVG to RCA 2/14 - managed medically    Past Surgical History  Procedure Laterality Date  . Cervical spine surgery      with fusions  . Right shoulder surgery    . Left shoulder surgery    . Appendectomy    . Hand surgery      BILATERAL  . Coronary artery bypass graft  12/2009    LIMA to LAD, SVG to OM, SVG to diagonal, SVG to RCA    Social History Mr. Warrick reports that he has been smoking Cigarettes.  He has a 50 pack-year smoking history. He quit smokeless tobacco use about 44 years ago. His smokeless tobacco use included Chew. Mr. Martino reports that  drinks alcohol.  Review of Systems No palpitations or syncope. No reported bleeding episodes. States he has a lot of trouble with stomach issues, reports a hiatal hernia and "acid damage" in his stomach. Otherwise negative.  Physical Examination Filed Vitals:   05/03/13 1300  BP: 107/71  Pulse: 64   Filed Weights   05/03/13 1300  Weight: 206 lb (93.441 kg)   Comfortable at rest. HEENT: Conjunctiva and lids normal, oropharynx clear. Neck: Supple, no elevated JVP or carotid  bruits, no thyromegaly. Lungs: Clear to auscultation, nonlabored breathing at rest. Cardiac: Regular rate and rhythm, no S3 or significant systolic murmur, no pericardial rub. Abdomen: Soft, nontender, bowel sounds present. Extremities: No pitting edema, distal pulses 2+. Skin: Warm and dry. Musculoskeletal: No kyphosis. Neuropsychiatric: Alert and oriented x3, affect grossly appropriate.   Problem List and Plan   Coronary atherosclerosis of native coronary artery Clinically stable with multivessel disease status post CABG, graft disease being managed medically at this time with documented collaterals to the native RCA. No change to current regimen. Encouraged a more regular exercise plan. Followup in 6 months.  Essential hypertension, benign Blood  pressure is normal today.  HYPERLIPIDEMIA LDL has been well controlled on current dose of Pravachol. Keep followup with Dr. Margo Common.  TOBACCO ABUSE Continue to recommend smoking cessation.    Jonelle Sidle, M.D., F.A.C.C.

## 2013-05-03 NOTE — Assessment & Plan Note (Signed)
LDL has been well controlled on current dose of Pravachol. Keep followup with Dr. Margo Common.

## 2013-10-02 ENCOUNTER — Other Ambulatory Visit: Payer: Self-pay | Admitting: Physician Assistant

## 2013-11-14 ENCOUNTER — Ambulatory Visit (INDEPENDENT_AMBULATORY_CARE_PROVIDER_SITE_OTHER): Payer: Medicare Other | Admitting: Cardiology

## 2013-11-14 ENCOUNTER — Encounter: Payer: Self-pay | Admitting: Cardiology

## 2013-11-14 VITALS — BP 128/88 | HR 66 | Ht 70.0 in | Wt 206.1 lb

## 2013-11-14 DIAGNOSIS — I10 Essential (primary) hypertension: Secondary | ICD-10-CM

## 2013-11-14 DIAGNOSIS — E785 Hyperlipidemia, unspecified: Secondary | ICD-10-CM

## 2013-11-14 DIAGNOSIS — F17200 Nicotine dependence, unspecified, uncomplicated: Secondary | ICD-10-CM

## 2013-11-14 DIAGNOSIS — I2510 Atherosclerotic heart disease of native coronary artery without angina pectoris: Secondary | ICD-10-CM

## 2013-11-14 NOTE — Assessment & Plan Note (Signed)
Symptomatically stable. Continue current medical regimen.

## 2013-11-14 NOTE — Patient Instructions (Signed)

## 2013-11-14 NOTE — Assessment & Plan Note (Signed)
Continues on Pravachol, keep followup with Dr. Margo Common for lipids.

## 2013-11-14 NOTE — Progress Notes (Signed)
Clinical Summary Mr. Weil is a 57 y.o.male last seen in June. He reports no progressive angina symptoms, states that he has been compliant with his medications. Still under a lot of stress at home, his wife has suffered with mental illness.  Cardiac catheterization in February of this year showed native vessel CAD with patent LIMA to LAD, patent SVG to OM, patent SVG to diagonal, and occluded SVG to RCA, with a chronically occluded RCA associated with left to right collaterals. He has been managed medically.  He has not had followup lipids as yet, I encouraged him to make a routine visit with Dr. Margo Common for a physical.   Allergies  Allergen Reactions  . Codeine     REACTION: Upset stomach    Current Outpatient Prescriptions  Medication Sig Dispense Refill  . albuterol (PROVENTIL HFA;VENTOLIN HFA) 108 (90 BASE) MCG/ACT inhaler Inhale 1 puff into the lungs every 6 (six) hours as needed for wheezing or shortness of breath.      Marland Kitchen aspirin EC 81 MG tablet Take 1 tablet (81 mg total) by mouth daily.      Marland Kitchen lisinopril-hydrochlorothiazide (PRINZIDE,ZESTORETIC) 20-12.5 MG per tablet Take 1 tablet by mouth every morning.      Marland Kitchen LORazepam (ATIVAN) 1 MG tablet Take 1 mg by mouth at bedtime as needed.      . Multiple Vitamin (MULTIVITAMIN) tablet Take 1 tablet by mouth daily.      . nitroGLYCERIN (NITROSTAT) 0.4 MG SL tablet Place 0.4 mg under the tongue every 5 (five) minutes as needed.      Marland Kitchen omeprazole (PRILOSEC) 20 MG capsule Take 20 mg by mouth every morning.       . pravastatin (PRAVACHOL) 40 MG tablet Take 40 mg by mouth every evening.       . Probiotic Product (PROBIOTIC DAILY PO) Take 1 capsule by mouth at bedtime.      . ranitidine (ZANTAC) 150 MG tablet Take 300 mg by mouth at bedtime.       No current facility-administered medications for this visit.    Past Medical History  Diagnosis Date  . Depression   . GERD (gastroesophageal reflux disease)   . Chronic pain syndrome   .  Essential hypertension, benign   . Mixed hyperlipidemia   . Coronary atherosclerosis of native coronary artery     Multivessel, occluded SVG to RCA 2/14 - managed medically    Past Surgical History  Procedure Laterality Date  . Cervical spine surgery      with fusions  . Right shoulder surgery    . Left shoulder surgery    . Appendectomy    . Hand surgery      BILATERAL  . Coronary artery bypass graft  12/2009    LIMA to LAD, SVG to OM, SVG to diagonal, SVG to RCA    Social History Mr. Varin reports that he has been smoking Cigarettes.  He has a 100 pack-year smoking history. He quit smokeless tobacco use about 44 years ago. His smokeless tobacco use included Chew. Mr. Rzepka reports that he drinks alcohol.  Review of Systems No palpitations or syncope. No bleeding problems. Has chronic shoulder and knee pains. Stable appetite. Otherwise negative.  Physical Examination Filed Vitals:   11/14/13 1120  BP: 128/88  Pulse: 66   Filed Weights   11/14/13 1120  Weight: 206 lb 1.9 oz (93.495 kg)    Comfortable at rest.  HEENT: Conjunctiva and lids normal, oropharynx clear.  Neck:  Supple, no elevated JVP or carotid bruits, no thyromegaly.  Lungs: Clear to auscultation, nonlabored breathing at rest.  Cardiac: Regular rate and rhythm, no S3 or significant systolic murmur, no pericardial rub.  Abdomen: Soft, nontender, bowel sounds present.  Extremities: No pitting edema, distal pulses 2+.  Skin: Warm and dry.  Musculoskeletal: No kyphosis.  Neuropsychiatric: Alert and oriented x3, affect grossly appropriate.   Problem List and Plan   Coronary atherosclerosis of native coronary artery Symptomatically stable. Continue current medical regimen.  HYPERLIPIDEMIA Continues on Pravachol, keep followup with Dr. Margo Common for lipids.  Essential hypertension, benign No change to current regimen. Of note, patient was not able to stay on even low-dose Lopressor due to symptomatic  bradycardia.  TOBACCO ABUSE Has not been able to quit smoking cigarettes.    Jonelle Sidle, M.D., F.A.C.C.

## 2013-11-14 NOTE — Assessment & Plan Note (Signed)
No change to current regimen. Of note, patient was not able to stay on even low-dose Lopressor due to symptomatic bradycardia.

## 2013-11-14 NOTE — Assessment & Plan Note (Signed)
Has not been able to quit smoking cigarettes.

## 2013-11-17 ENCOUNTER — Ambulatory Visit: Payer: Medicare Other | Admitting: Cardiology

## 2014-01-25 ENCOUNTER — Telehealth: Payer: Self-pay | Admitting: Cardiology

## 2014-01-25 NOTE — Telephone Encounter (Signed)
Brandon Pratt calls stating that he feels weak, has dizzy spells. States I am having problems With standing long periods of time. Patient states that he is having shortness of breath.  Please call # [phone]

## 2014-01-25 NOTE — Telephone Encounter (Signed)
Spoke with patient and he c/o of sob, tires easily even after standing still. Patient c/o intermittent pressure in his chest that felt like gas and rated a 5 on a scale of 1-10 (10 being the greatest) that's lasted for 2 weeks. Patient said it wasn't bad enough to take nitroglycerin. Patient also c/o of dizziness. No appetite. Appointment given to patient for Friday this week and patient was informed to proceed to the ED if his symptoms got worse. Patient verbalized understanding of plan.

## 2014-01-27 ENCOUNTER — Ambulatory Visit (INDEPENDENT_AMBULATORY_CARE_PROVIDER_SITE_OTHER): Payer: Medicare Other | Admitting: Cardiology

## 2014-01-27 ENCOUNTER — Encounter: Payer: Self-pay | Admitting: Cardiology

## 2014-01-27 VITALS — BP 117/75 | HR 85 | Ht 70.0 in | Wt 211.0 lb

## 2014-01-27 DIAGNOSIS — I10 Essential (primary) hypertension: Secondary | ICD-10-CM

## 2014-01-27 DIAGNOSIS — I2510 Atherosclerotic heart disease of native coronary artery without angina pectoris: Secondary | ICD-10-CM

## 2014-01-27 NOTE — Assessment & Plan Note (Signed)
ECG stable. Does not endorse progressive angina symptoms. Continue current medical regimen. Could always consider adding a low-dose long-acting nitrate if he develops any significant angina symptoms. Keep regular followup.

## 2014-01-27 NOTE — Progress Notes (Signed)
Clinical Summary Mr. Steffenhagen is a 58 y.o.male last seen in December 2014. He was placed on the schedule today following a call to nursing describing recurrent chest pain symptoms and dizziness, also decreased appetite. He describes his symptoms to me today as including intermittent cough, a feeling of weakness and dizziness, does not want to get out of bed sometimes, also mentions a lot of stress in his personal life. He states that his wife has bipolar disorder and they have confrontations regularly. He does not actually endorse any progressive chest pain, has not used any nitroglycerin.  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  Cardiac catheterization in February 2014 showed native vessel CAD with patent LIMA to LAD, patent SVG to OM, patent SVG to diagonal, and occluded SVG to RCA, with a chronically occluded RCA associated with left to right collaterals. He has been managed medically.  We reviewed his medications. He has had prior intolerances to beta blocker due to symptomatic bradycardia. Since not using short acting nitrate, not certain that Imdur would be particularly helpful at this time. I suspect his symptoms are multifactorial.   Allergies  Allergen Reactions  . Codeine     REACTION: Upset stomach    Current Outpatient Prescriptions  Medication Sig Dispense Refill  . albuterol (PROVENTIL HFA;VENTOLIN HFA) 108 (90 BASE) MCG/ACT inhaler Inhale 1 puff into the lungs every 6 (six) hours as needed for wheezing or shortness of breath.      Marland Kitchen aspirin EC 81 MG tablet Take 1 tablet (81 mg total) by mouth daily.      Marland Kitchen lisinopril-hydrochlorothiazide (PRINZIDE,ZESTORETIC) 20-12.5 MG per tablet Take 1 tablet by mouth every morning.      Marland Kitchen LORazepam (ATIVAN) 1 MG tablet Take 1 mg by mouth at bedtime as needed.      . magnesium oxide (MAG-OX) 400 MG tablet Take 400 mg by mouth daily.      . Multiple Vitamin (MULTIVITAMIN) tablet  Take 1 tablet by mouth daily.      . nitroGLYCERIN (NITROSTAT) 0.4 MG SL tablet Place 0.4 mg under the tongue every 5 (five) minutes as needed.      Marland Kitchen omeprazole (PRILOSEC) 20 MG capsule Take 20 mg by mouth every morning.       . pravastatin (PRAVACHOL) 40 MG tablet Take 40 mg by mouth every evening.       . Probiotic Product (PROBIOTIC DAILY PO) Take 1 capsule by mouth at bedtime.      . ranitidine (ZANTAC) 150 MG tablet Take 300 mg by mouth at bedtime.       No current facility-administered medications for this visit.    Past Medical History  Diagnosis Date  . Depression   . GERD (gastroesophageal reflux disease)   . Chronic pain syndrome   . Essential hypertension, benign   . Mixed hyperlipidemia   . Coronary atherosclerosis of native coronary artery     Multivessel, occluded SVG to RCA 2/14 - managed medically    Past Surgical History  Procedure Laterality Date  . Cervical spine surgery      with fusions  . Right shoulder surgery    . Left shoulder surgery    . Appendectomy    . Hand surgery      BILATERAL  . Coronary artery bypass graft  12/2009    LIMA to LAD, SVG to OM, SVG to diagonal, SVG to RCA    Social History Mr.  Tropea reports that he has been smoking Cigarettes.  He has a 100 pack-year smoking history. He quit smokeless tobacco use about 45 years ago. His smokeless tobacco use included Chew. Mr. Moder reports that he drinks alcohol.  Review of Systems Negative except as outlined above.  Physical Examination Filed Vitals:   01/27/14 1252  BP: 117/75  Pulse: 85   Filed Weights   01/27/14 1252  Weight: 211 lb (95.709 kg)    Comfortable at rest. No active chest pain. HEENT: Conjunctiva and lids normal, oropharynx clear.  Neck: Supple, no elevated JVP or carotid bruits, no thyromegaly.  Lungs: Clear to auscultation, nonlabored breathing at rest.  Cardiac: Regular rate and rhythm, no S3 or significant systolic murmur, no pericardial rub.  Abdomen:  Soft, nontender, bowel sounds present.  Extremities: No pitting edema, distal pulses 2+.    Problem List and Plan   Coronary atherosclerosis of native coronary artery ECG stable. Does not endorse progressive angina symptoms. Continue current medical regimen. Could always consider adding a low-dose long-acting nitrate if he develops any significant angina symptoms. Keep regular followup.  Essential hypertension, benign Blood pressure is normal today.    Satira Sark, M.D., F.A.C.C.

## 2014-01-27 NOTE — Assessment & Plan Note (Signed)
Blood pressure is normal today. 

## 2014-01-27 NOTE — Patient Instructions (Signed)
Your physician wants you to follow-up in: 6 months You will receive a reminder letter in the mail two months in advance. If you don't receive a letter, please call our office to schedule the follow-up appointment.     Your physician recommends that you continue on your current medications as directed. Please refer to the Current Medication list given to you today.      Thank you for choosing [facility] !        

## 2015-01-22 ENCOUNTER — Encounter (INDEPENDENT_AMBULATORY_CARE_PROVIDER_SITE_OTHER): Payer: Medicare Other | Admitting: Ophthalmology

## 2015-01-22 DIAGNOSIS — H35033 Hypertensive retinopathy, bilateral: Secondary | ICD-10-CM | POA: Diagnosis not present

## 2015-01-22 DIAGNOSIS — I10 Essential (primary) hypertension: Secondary | ICD-10-CM | POA: Diagnosis not present

## 2015-01-22 DIAGNOSIS — H2513 Age-related nuclear cataract, bilateral: Secondary | ICD-10-CM

## 2015-01-22 DIAGNOSIS — H43813 Vitreous degeneration, bilateral: Secondary | ICD-10-CM | POA: Diagnosis not present

## 2015-01-22 DIAGNOSIS — H34832 Tributary (branch) retinal vein occlusion, left eye: Secondary | ICD-10-CM | POA: Diagnosis not present

## 2015-01-29 ENCOUNTER — Encounter (INDEPENDENT_AMBULATORY_CARE_PROVIDER_SITE_OTHER): Payer: Medicare Other | Admitting: Ophthalmology

## 2015-03-06 ENCOUNTER — Encounter (INDEPENDENT_AMBULATORY_CARE_PROVIDER_SITE_OTHER): Payer: Medicare Other | Admitting: Ophthalmology

## 2015-03-07 ENCOUNTER — Encounter (INDEPENDENT_AMBULATORY_CARE_PROVIDER_SITE_OTHER): Payer: Medicare Other | Admitting: Ophthalmology

## 2015-03-07 DIAGNOSIS — H2513 Age-related nuclear cataract, bilateral: Secondary | ICD-10-CM | POA: Diagnosis not present

## 2015-03-07 DIAGNOSIS — I10 Essential (primary) hypertension: Secondary | ICD-10-CM

## 2015-03-07 DIAGNOSIS — H34812 Central retinal vein occlusion, left eye: Secondary | ICD-10-CM | POA: Diagnosis not present

## 2015-03-07 DIAGNOSIS — H43813 Vitreous degeneration, bilateral: Secondary | ICD-10-CM | POA: Diagnosis not present

## 2015-03-07 DIAGNOSIS — H35033 Hypertensive retinopathy, bilateral: Secondary | ICD-10-CM | POA: Diagnosis not present

## 2015-03-07 DIAGNOSIS — H4312 Vitreous hemorrhage, left eye: Secondary | ICD-10-CM | POA: Diagnosis not present

## 2015-03-07 NOTE — H&P (Signed)
Brandon Pratt is an 59 y.o. male.   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 Depression   . GERD (gastroesophageal reflux disease)   . Chronic pain syndrome   . Essential hypertension, benign   . Mixed hyperlipidemia   . Coronary atherosclerosis of native coronary artery     Multivessel, occluded SVG to RCA 2/14 - managed medically    Past Surgical History  Procedure Laterality Date  . Cervical spine surgery      with fusions  . Right shoulder surgery    . Left shoulder surgery    . Appendectomy    . Hand surgery      BILATERAL  . Coronary artery bypass graft  12/2009    LIMA to LAD, SVG to OM, SVG to diagonal, SVG to RCA    Family History  Problem Relation Age of Onset  . Hypertension Mother   . Diabetes Mother   . Other Mother     Pacemaker  . Stroke Father    Social History:  reports that he has been smoking Cigarettes.  He has a 100 pack-year smoking history. He quit smokeless tobacco use about 46 years ago. His smokeless tobacco use included Chew. He reports that he drinks alcohol. He reports that he uses illicit drugs.  Allergies:  Allergies  Allergen Reactions  . Codeine     REACTION: Upset stomach    No prescriptions prior to admission    Review of systems otherwise negative  There were no vitals taken for this visit.  Physical exam: Mental status: oriented x3. Eyes: See eye exam associated with this date of surgery in media tab.  Scanned in by scanning center Ears, Nose, Throat: within normal limits Neck: Within Normal limits General: within normal limits Chest: Within normal limits Breast: deferred Heart: Within normal limits Abdomen: Within normal limits GU: deferred Extremities: within normal limits Skin: within normal limits  Assessment/Plan Vitreous hemorrhage, branch retinal vein occlusion left eye Plan: To [facility] for Pars plana vitrectomy, laser, gas injection left eye  Hayden Pedro 03/07/2015, 5:16 PM

## 2015-03-12 ENCOUNTER — Encounter (HOSPITAL_COMMUNITY)
Admission: RE | Admit: 2015-03-12 | Discharge: 2015-03-12 | Disposition: A | Discharge status: Home / Self Care | Payer: Medicare Other | Source: Ambulatory Visit | Attending: Ophthalmology | Admitting: Ophthalmology

## 2015-03-12 ENCOUNTER — Encounter (HOSPITAL_COMMUNITY): Payer: Self-pay

## 2015-03-12 DIAGNOSIS — H34812 Central retinal vein occlusion, left eye: Secondary | ICD-10-CM | POA: Diagnosis not present

## 2015-03-12 DIAGNOSIS — I10 Essential (primary) hypertension: Secondary | ICD-10-CM | POA: Diagnosis not present

## 2015-03-12 DIAGNOSIS — I2510 Atherosclerotic heart disease of native coronary artery without angina pectoris: Secondary | ICD-10-CM | POA: Diagnosis not present

## 2015-03-12 DIAGNOSIS — K219 Gastro-esophageal reflux disease without esophagitis: Secondary | ICD-10-CM | POA: Diagnosis not present

## 2015-03-12 DIAGNOSIS — G894 Chronic pain syndrome: Secondary | ICD-10-CM | POA: Diagnosis not present

## 2015-03-12 DIAGNOSIS — H4312 Vitreous hemorrhage, left eye: Secondary | ICD-10-CM | POA: Diagnosis present

## 2015-03-12 DIAGNOSIS — E782 Mixed hyperlipidemia: Secondary | ICD-10-CM | POA: Diagnosis not present

## 2015-03-12 HISTORY — DX: Unspecified osteoarthritis, unspecified site: M19.90

## 2015-03-12 HISTORY — DX: Personal history of other diseases of the digestive system: Z87.19

## 2015-03-12 HISTORY — DX: Anxiety disorder, unspecified: F41.9

## 2015-03-12 LAB — CBC
HCT: 48.9 % (ref 39.0–52.0)
HEMOGLOBIN: 17.2 g/dL — AB (ref 13.0–17.0)
MCH: 32.8 pg (ref 26.0–34.0)
MCHC: 35.2 g/dL (ref 30.0–36.0)
MCV: 93.3 fL (ref 78.0–100.0)
Platelets: 205 10*3/uL (ref 150–400)
RBC: 5.24 MIL/uL (ref 4.22–5.81)
RDW: 12.8 % (ref 11.5–15.5)
WBC: 8.5 10*3/uL (ref 4.0–10.5)

## 2015-03-12 LAB — BASIC METABOLIC PANEL
Anion gap: 7 (ref 5–15)
BUN: 6 mg/dL (ref 6–23)
CO2: 32 mmol/L (ref 19–32)
Calcium: 9.4 mg/dL (ref 8.4–10.5)
Chloride: 99 mmol/L (ref 96–112)
Creatinine, Ser: 0.86 mg/dL (ref 0.50–1.35)
GFR calc Af Amer: >90 mL/min (ref >90)
GFR calc non Af Amer: >90 mL/min (ref >90)
GLUCOSE: 90 mg/dL (ref 70–99)
POTASSIUM: 4.1 mmol/L (ref 3.5–5.1)
Sodium: 138 mmol/L (ref 135–145)

## 2015-03-12 MED ORDER — CYCLOPENTOLATE HCL 1 % OP SOLN
1.0000 [drp] | OPHTHALMIC | Status: AC | PRN
  Administered 2015-03-13: 1 [drp] via OPHTHALMIC
  Filled 2015-03-12: qty 2

## 2015-03-12 MED ORDER — TROPICAMIDE 1 % OP SOLN
1.0000 [drp] | OPHTHALMIC | Status: AC | PRN
  Administered 2015-03-13: 1 [drp] via OPHTHALMIC
  Filled 2015-03-12: qty 3

## 2015-03-12 MED ORDER — GATIFLOXACIN 0.5 % OP SOLN
1.0000 [drp] | OPHTHALMIC | Status: AC | PRN
  Administered 2015-03-13: 1 [drp] via OPHTHALMIC
  Filled 2015-03-12: qty 2.5

## 2015-03-12 MED ORDER — CEFAZOLIN SODIUM-DEXTROSE 2-3 GM-% IV SOLR
2.0000 g | INTRAVENOUS | Status: AC
  Administered 2015-03-13: 2 g via INTRAVENOUS
  Filled 2015-03-12: qty 50

## 2015-03-12 MED ORDER — PHENYLEPHRINE HCL 2.5 % OP SOLN
1.0000 [drp] | OPHTHALMIC | Status: AC | PRN
  Administered 2015-03-13: 1 [drp] via OPHTHALMIC
  Filled 2015-03-12: qty 2

## 2015-03-12 NOTE — Pre-Procedure Instructions (Signed)
MEHKAI GALLO  03/12/2015   Your procedure is scheduled on:  April 12  Report to [facility] Admitting at Per Dr. Zigmund Daniel.  Call this number if you have problems the morning of surgery: [phone]   Remember:   Do not eat food or drink liquids after midnight.   Take these medicines the morning of surgery with A SIP OF WATER: Per Dr. Zigmund Daniel   Do not wear jewelry, make-up or nail polish.  Do not wear lotions, powders, or perfumes. You may wear deodorant.  Do not shave 48 hours prior to surgery. Men may shave face and neck.  Do not bring valuables to the hospital.  [facility] is not responsible for any belongings or valuables.               Contacts, dentures or bridgework may not be worn into surgery.  Leave suitcase in the car. After surgery it may be brought to your room.  For patients admitted to the hospital, discharge time is determined by your treatment team.               Special Instructions: See [facility] Preparing For Surgery   Please read over the following fact sheets that you were given: Pain Booklet, Coughing and Deep Breathing and Surgical Site Infection Prevention

## 2015-03-12 NOTE — Progress Notes (Signed)
Patient denies  chest pain, shortness of breath.  Patients cardiologist is Dr Domenic Polite states he saw him in January 2016.  Records show he was seen last 01/2014. I called the Dr Domenic Polite 's office and  They have no report of patientt being seen since 01/2014.

## 2015-03-12 NOTE — Progress Notes (Signed)
Anesthesia Chart Review:  Patient is a 59 year old male scheduled for pars plana vitrectomy, left tomorrow by Dr. Zigmund Daniel. PAT was this afternoon.   History includes CAD s/p CABG '11, smoking, GERD, depression, HTN, HLD, anxiety, hiatal hernia, arthritis, c-spine surgery. PCP is Dr. Matthias Hughs in [place].  He has medically cleared patient for this procedure.  Cardiologist is Dr. Rozann Lesches, last visit 01/27/14. He denied chest pain or SOB at PAT. No Nitro use. He was able to ambulate from the parking deck into the hospital and in to PAT without chest pain or significant SOB.  He denied any new CV symptoms.   Meds include ASA, albuterol, lisinopril, Ativan, Nitro, omeprazole, pravastatin, Zantac.  He reports continued compliance with his medications.  He is not on b-blocker therapy (and has not been on one dating back to at least cardiology visits in 2013; I haven't been able to see why not, but possibly due to pulmonary history.)  03/12/15 EKG: NSR, LAD, R BBB. No significant change when compared to prior EKGs dating back to at least 12/20/12.  Cardiac catheterization in 01/11/13 showed native vessel CAD with patent LIMA to LAD, patent SVG to OM, patent SVG to diagonal, and occluded SVG to RCA, with a chronically occluded RCA associated with left to right collaterals. He has been managed medically. Well preserved LV function. (Cardiac cath was done following a nuclear stress test at [facility] on 12/29/12 that showed Equivocal LV perfusion, overall low risk study but cannot exclude mild inferior ischemia, EF 66%.)   Preoperative labs noted.  Patient is denied CV symptoms, his EKG is stable, last cath was just over two years ago and no PCI planned because patient was asymptomatic.  He was cleared for surgery by his medical physician.  If no acute changes then I would anticipate that he could proceed as planned.  George Hugh [facility] Short Stay Center/Anesthesiology Phone [phone] 03/12/2015 3:24  PM

## 2015-03-13 ENCOUNTER — Encounter (HOSPITAL_COMMUNITY)
Admission: RE | Disposition: A | Discharge status: Home / Self Care | Payer: Self-pay | Source: Ambulatory Visit | Attending: Ophthalmology

## 2015-03-13 ENCOUNTER — Encounter (HOSPITAL_COMMUNITY): Payer: Self-pay | Admitting: *Deleted

## 2015-03-13 ENCOUNTER — Ambulatory Visit (HOSPITAL_COMMUNITY)
Admission: RE | Admit: 2015-03-13 | Discharge: 2015-03-14 | Disposition: A | Discharge status: Home / Self Care | Payer: Medicare Other | Source: Ambulatory Visit | Attending: Ophthalmology | Admitting: Ophthalmology

## 2015-03-13 ENCOUNTER — Ambulatory Visit (HOSPITAL_COMMUNITY): Payer: Medicare Other | Admitting: Anesthesiology

## 2015-03-13 ENCOUNTER — Encounter (INDEPENDENT_AMBULATORY_CARE_PROVIDER_SITE_OTHER): Payer: Medicare Other | Admitting: Ophthalmology

## 2015-03-13 ENCOUNTER — Ambulatory Visit (HOSPITAL_COMMUNITY): Payer: Medicare Other | Admitting: Vascular Surgery

## 2015-03-13 DIAGNOSIS — K219 Gastro-esophageal reflux disease without esophagitis: Secondary | ICD-10-CM | POA: Diagnosis not present

## 2015-03-13 DIAGNOSIS — G894 Chronic pain syndrome: Secondary | ICD-10-CM | POA: Diagnosis not present

## 2015-03-13 DIAGNOSIS — I10 Essential (primary) hypertension: Secondary | ICD-10-CM | POA: Insufficient documentation

## 2015-03-13 DIAGNOSIS — E782 Mixed hyperlipidemia: Secondary | ICD-10-CM | POA: Insufficient documentation

## 2015-03-13 DIAGNOSIS — I2510 Atherosclerotic heart disease of native coronary artery without angina pectoris: Secondary | ICD-10-CM | POA: Insufficient documentation

## 2015-03-13 DIAGNOSIS — H4312 Vitreous hemorrhage, left eye: Secondary | ICD-10-CM | POA: Diagnosis present

## 2015-03-13 DIAGNOSIS — H35372 Puckering of macula, left eye: Secondary | ICD-10-CM | POA: Diagnosis present

## 2015-03-13 DIAGNOSIS — H34812 Central retinal vein occlusion, left eye: Secondary | ICD-10-CM | POA: Diagnosis not present

## 2015-03-13 DIAGNOSIS — H4310 Vitreous hemorrhage, unspecified eye: Secondary | ICD-10-CM | POA: Diagnosis present

## 2015-03-13 HISTORY — PX: GAS/FLUID EXCHANGE: SHX5334

## 2015-03-13 HISTORY — PX: MEMBRANE PEEL: SHX5967

## 2015-03-13 HISTORY — PX: LASER PHOTO ABLATION: SHX5942

## 2015-03-13 HISTORY — PX: PARS PLANA VITRECTOMY: SHX2166

## 2015-03-13 SURGERY — PARS PLANA VITRECTOMY WITH 25 GAUGE
Anesthesia: General | Site: Eye | Laterality: Left

## 2015-03-13 MED ORDER — ACETAMINOPHEN 325 MG PO TABS: 325.0000 mg | ORAL_TABLET | ORAL | Status: DC | PRN

## 2015-03-13 MED ORDER — BUPIVACAINE HCL (PF) 0.75 % IJ SOLN
INTRAMUSCULAR | Status: DC | PRN
  Administered 2015-03-13: 20 mL

## 2015-03-13 MED ORDER — SODIUM CHLORIDE 0.45 % IV SOLN
INTRAVENOUS | Status: DC
  Administered 2015-03-13: 14:00:00 via INTRAVENOUS

## 2015-03-13 MED ORDER — ALBUTEROL SULFATE (2.5 MG/3ML) 0.083% IN NEBU: 2.5000 mg | INHALATION_SOLUTION | Freq: Four times a day (QID) | RESPIRATORY_TRACT | Status: DC | PRN

## 2015-03-13 MED ORDER — PRAVASTATIN SODIUM 10 MG PO TABS
40.0000 mg | ORAL_TABLET | Freq: Every evening | ORAL | Status: DC
  Administered 2015-03-13: 40 mg via ORAL
  Filled 2015-03-13: qty 4

## 2015-03-13 MED ORDER — BSS PLUS IO SOLN: INTRAOCULAR | Status: DC | PRN

## 2015-03-13 MED ORDER — NEOSTIGMINE METHYLSULFATE 10 MG/10ML IV SOLN
INTRAVENOUS | Status: DC | PRN
  Administered 2015-03-13: 4 mg via INTRAVENOUS

## 2015-03-13 MED ORDER — CEFAZOLIN SODIUM-DEXTROSE 2-3 GM-% IV SOLR
INTRAVENOUS | Status: DC | PRN
  Administered 2015-03-13: 2 g via INTRAVENOUS

## 2015-03-13 MED ORDER — CYCLOPENTOLATE HCL 1 % OP SOLN
1.0000 [drp] | OPHTHALMIC | Status: AC | PRN
  Administered 2015-03-13 (×2): 1 [drp] via OPHTHALMIC

## 2015-03-13 MED ORDER — LIDOCAINE HCL 2 % IJ SOLN
INTRAMUSCULAR | Status: AC
  Filled 2015-03-13: qty 20

## 2015-03-13 MED ORDER — GATIFLOXACIN 0.5 % OP SOLN
1.0000 [drp] | OPHTHALMIC | Status: AC | PRN
Start: 2015-03-13 — End: 2015-03-13
  Administered 2015-03-13 (×2): 1 [drp] via OPHTHALMIC

## 2015-03-13 MED ORDER — MORPHINE SULFATE 2 MG/ML IJ SOLN: 1.0000 mg | INTRAMUSCULAR | Status: DC | PRN

## 2015-03-13 MED ORDER — SODIUM CHLORIDE 0.9 % IV SOLN
Freq: Once | INTRAVENOUS | Status: AC
  Administered 2015-03-13: 10:00:00 via INTRAVENOUS

## 2015-03-13 MED ORDER — ATROPINE SULFATE 1 % OP SOLN
OPHTHALMIC | Status: AC
  Filled 2015-03-13: qty 5

## 2015-03-13 MED ORDER — ACETAMINOPHEN 160 MG/5ML PO SOLN
325.0000 mg | ORAL | Status: DC | PRN
  Filled 2015-03-13: qty 20.3

## 2015-03-13 MED ORDER — LISINOPRIL 20 MG PO TABS
20.0000 mg | ORAL_TABLET | Freq: Every day | ORAL | Status: DC
  Administered 2015-03-13: 20 mg via ORAL
  Filled 2015-03-13: qty 1

## 2015-03-13 MED ORDER — STERILE WATER FOR INJECTION IJ SOLN
INTRAMUSCULAR | Status: AC
  Filled 2015-03-13: qty 10

## 2015-03-13 MED ORDER — BACITRACIN-POLYMYXIN B 500-10000 UNIT/GM OP OINT
TOPICAL_OINTMENT | OPHTHALMIC | Status: DC | PRN
  Administered 2015-03-13: 1 via OPHTHALMIC

## 2015-03-13 MED ORDER — POLYMYXIN B SULFATE 500000 UNITS IJ SOLR
INTRAMUSCULAR | Status: AC
  Filled 2015-03-13: qty 1

## 2015-03-13 MED ORDER — TROPICAMIDE 1 % OP SOLN
1.0000 [drp] | OPHTHALMIC | Status: AC | PRN
  Administered 2015-03-13 (×2): 1 [drp] via OPHTHALMIC

## 2015-03-13 MED ORDER — ONDANSETRON HCL 4 MG/2ML IJ SOLN
INTRAMUSCULAR | Status: DC | PRN
Start: 2015-03-13 — End: 2015-03-13
  Administered 2015-03-13: 4 mg via INTRAVENOUS

## 2015-03-13 MED ORDER — FENTANYL CITRATE 0.05 MG/ML IJ SOLN
25.0000 ug | INTRAMUSCULAR | Status: DC | PRN
  Administered 2015-03-13 (×4): 25 ug via INTRAVENOUS

## 2015-03-13 MED ORDER — PANTOPRAZOLE SODIUM 40 MG PO TBEC
40.0000 mg | DELAYED_RELEASE_TABLET | Freq: Every day | ORAL | Status: DC
  Administered 2015-03-13: 40 mg via ORAL
  Filled 2015-03-13: qty 1

## 2015-03-13 MED ORDER — MAGNESIUM HYDROXIDE 400 MG/5ML PO SUSP: 15.0000 mL | Freq: Four times a day (QID) | ORAL | Status: DC | PRN

## 2015-03-13 MED ORDER — TEMAZEPAM 15 MG PO CAPS: 15.0000 mg | ORAL_CAPSULE | Freq: Every evening | ORAL | Status: DC | PRN

## 2015-03-13 MED ORDER — FENTANYL CITRATE 0.05 MG/ML IJ SOLN
INTRAMUSCULAR | Status: AC
  Filled 2015-03-13: qty 5

## 2015-03-13 MED ORDER — ASPIRIN EC 81 MG PO TBEC
81.0000 mg | DELAYED_RELEASE_TABLET | Freq: Every day | ORAL | Status: DC
  Administered 2015-03-13: 81 mg via ORAL
  Filled 2015-03-13: qty 1

## 2015-03-13 MED ORDER — SODIUM CHLORIDE 0.9 % IV SOLN
INTRAVENOUS | Status: DC | PRN
  Administered 2015-03-13 (×2): via INTRAVENOUS

## 2015-03-13 MED ORDER — LIDOCAINE HCL 4 % MT SOLN
OROMUCOSAL | Status: DC | PRN
Start: 2015-03-13 — End: 2015-03-13
  Administered 2015-03-13: 4 mL via TOPICAL

## 2015-03-13 MED ORDER — DEXAMETHASONE SODIUM PHOSPHATE 10 MG/ML IJ SOLN
INTRAMUSCULAR | Status: DC | PRN
  Administered 2015-03-13: 10 mg via INTRAVENOUS

## 2015-03-13 MED ORDER — FENTANYL CITRATE 0.05 MG/ML IJ SOLN
INTRAMUSCULAR | Status: DC | PRN
  Administered 2015-03-13: 100 ug via INTRAVENOUS
  Administered 2015-03-13: 50 ug via INTRAVENOUS

## 2015-03-13 MED ORDER — FAMOTIDINE 10 MG PO TABS
10.0000 mg | ORAL_TABLET | Freq: Every day | ORAL | Status: DC
  Administered 2015-03-13: 10 mg via ORAL
  Filled 2015-03-13: qty 1

## 2015-03-13 MED ORDER — SUCCINYLCHOLINE CHLORIDE 20 MG/ML IJ SOLN
INTRAMUSCULAR | Status: DC | PRN
  Administered 2015-03-13: 60 mg via INTRAVENOUS

## 2015-03-13 MED ORDER — PROPOFOL 10 MG/ML IV BOLUS
INTRAVENOUS | Status: AC
  Filled 2015-03-13: qty 20

## 2015-03-13 MED ORDER — HYDROCODONE-ACETAMINOPHEN 5-325 MG PO TABS
1.0000 | ORAL_TABLET | ORAL | Status: DC | PRN
  Administered 2015-03-13 (×2): 2 via ORAL
  Filled 2015-03-13 (×2): qty 2

## 2015-03-13 MED ORDER — ACETAZOLAMIDE SODIUM 500 MG IJ SOLR
INTRAMUSCULAR | Status: AC
  Filled 2015-03-13: qty 500

## 2015-03-13 MED ORDER — MIDAZOLAM HCL 2 MG/2ML IJ SOLN
INTRAMUSCULAR | Status: AC
  Filled 2015-03-13: qty 2

## 2015-03-13 MED ORDER — SODIUM CHLORIDE 0.9 % IJ SOLN
INTRAMUSCULAR | Status: DC | PRN
  Administered 2015-03-13: 12:00:00

## 2015-03-13 MED ORDER — BUPIVACAINE HCL (PF) 0.75 % IJ SOLN
INTRAMUSCULAR | Status: AC
  Filled 2015-03-13: qty 10

## 2015-03-13 MED ORDER — SODIUM HYALURONATE 10 MG/ML IO SOLN
INTRAOCULAR | Status: AC
  Filled 2015-03-13: qty 0.85

## 2015-03-13 MED ORDER — ACETAZOLAMIDE SODIUM 500 MG IJ SOLR
500.0000 mg | Freq: Once | INTRAMUSCULAR | Status: AC
  Administered 2015-03-14: 500 mg via INTRAVENOUS
  Filled 2015-03-13: qty 500

## 2015-03-13 MED ORDER — 0.9 % SODIUM CHLORIDE (POUR BTL) OPTIME
TOPICAL | Status: DC | PRN
  Administered 2015-03-13: 1000 mL

## 2015-03-13 MED ORDER — BRIMONIDINE TARTRATE 0.2 % OP SOLN
1.0000 [drp] | Freq: Two times a day (BID) | OPHTHALMIC | Status: DC
  Filled 2015-03-13: qty 5

## 2015-03-13 MED ORDER — PHENYLEPHRINE HCL 2.5 % OP SOLN
1.0000 [drp] | OPHTHALMIC | Status: AC | PRN
  Administered 2015-03-13 (×2): 1 [drp] via OPHTHALMIC

## 2015-03-13 MED ORDER — BACITRACIN-POLYMYXIN B 500-10000 UNIT/GM OP OINT
1.0000 "application " | TOPICAL_OINTMENT | Freq: Three times a day (TID) | OPHTHALMIC | Status: DC
  Filled 2015-03-13: qty 3.5

## 2015-03-13 MED ORDER — BSS IO SOLN: INTRAOCULAR | Status: DC | PRN

## 2015-03-13 MED ORDER — BACITRACIN-POLYMYXIN B 500-10000 UNIT/GM OP OINT
TOPICAL_OINTMENT | OPHTHALMIC | Status: AC
  Filled 2015-03-13: qty 3.5

## 2015-03-13 MED ORDER — PREDNISOLONE ACETATE 1 % OP SUSP
1.0000 [drp] | Freq: Four times a day (QID) | OPHTHALMIC | Status: DC
  Filled 2015-03-13: qty 5

## 2015-03-13 MED ORDER — HYPROMELLOSE (GONIOSCOPIC) 2.5 % OP SOLN
OPHTHALMIC | Status: AC
  Filled 2015-03-13: qty 15

## 2015-03-13 MED ORDER — SODIUM HYALURONATE 10 MG/ML IO SOLN
INTRAOCULAR | Status: DC | PRN
  Administered 2015-03-13: 0.85 mL via INTRAOCULAR

## 2015-03-13 MED ORDER — ROCURONIUM BROMIDE 100 MG/10ML IV SOLN
INTRAVENOUS | Status: DC | PRN
  Administered 2015-03-13: 20 mg via INTRAVENOUS

## 2015-03-13 MED ORDER — SODIUM CHLORIDE 0.9 % IJ SOLN
INTRAMUSCULAR | Status: AC
  Filled 2015-03-13: qty 10

## 2015-03-13 MED ORDER — BSS IO SOLN
INTRAOCULAR | Status: DC | PRN
  Administered 2015-03-13: 15 mL

## 2015-03-13 MED ORDER — EPHEDRINE SULFATE 50 MG/ML IJ SOLN
INTRAMUSCULAR | Status: DC | PRN
  Administered 2015-03-13: 5 mg via INTRAVENOUS

## 2015-03-13 MED ORDER — NITROGLYCERIN 0.4 MG SL SUBL: 0.4000 mg | SUBLINGUAL_TABLET | SUBLINGUAL | Status: DC | PRN

## 2015-03-13 MED ORDER — ALBUTEROL SULFATE (2.5 MG/3ML) 0.083% IN NEBU: 1.0000 mL | INHALATION_SOLUTION | Freq: Four times a day (QID) | RESPIRATORY_TRACT | Status: DC | PRN

## 2015-03-13 MED ORDER — GATIFLOXACIN 0.5 % OP SOLN
1.0000 [drp] | Freq: Four times a day (QID) | OPHTHALMIC | Status: DC
Start: 2015-03-14 — End: 2015-03-14
  Filled 2015-03-13: qty 2.5

## 2015-03-13 MED ORDER — GENTAMICIN SULFATE 40 MG/ML IJ SOLN
INTRAMUSCULAR | Status: AC
  Filled 2015-03-13: qty 2

## 2015-03-13 MED ORDER — EPINEPHRINE HCL 1 MG/ML IJ SOLN
INTRAMUSCULAR | Status: AC
  Filled 2015-03-13: qty 1

## 2015-03-13 MED ORDER — TETRACAINE HCL 0.5 % OP SOLN
2.0000 [drp] | Freq: Once | OPHTHALMIC | Status: DC
  Filled 2015-03-13: qty 2

## 2015-03-13 MED ORDER — EPINEPHRINE HCL 1 MG/ML IJ SOLN
INTRAMUSCULAR | Status: DC | PRN
  Administered 2015-03-13: 12:00:00

## 2015-03-13 MED ORDER — LIDOCAINE HCL (CARDIAC) 20 MG/ML IV SOLN
INTRAVENOUS | Status: DC | PRN
  Administered 2015-03-13: 60 mg via INTRAVENOUS

## 2015-03-13 MED ORDER — BSS IO SOLN
INTRAOCULAR | Status: AC
  Filled 2015-03-13: qty 15

## 2015-03-13 MED ORDER — LATANOPROST 0.005 % OP SOLN
1.0000 [drp] | Freq: Every day | OPHTHALMIC | Status: DC
Start: 2015-03-14 — End: 2015-03-14
  Filled 2015-03-13: qty 2.5

## 2015-03-13 MED ORDER — HYALURONIDASE HUMAN 150 UNIT/ML IJ SOLN
INTRAMUSCULAR | Status: AC
  Filled 2015-03-13: qty 1

## 2015-03-13 MED ORDER — NEOSTIGMINE METHYLSULFATE 10 MG/10ML IV SOLN
INTRAVENOUS | Status: AC
  Filled 2015-03-13: qty 1

## 2015-03-13 MED ORDER — ONDANSETRON HCL 4 MG/2ML IJ SOLN
4.0000 mg | Freq: Four times a day (QID) | INTRAMUSCULAR | Status: DC
Start: 2015-03-13 — End: 2015-03-14
  Administered 2015-03-13: 4 mg via INTRAVENOUS
  Filled 2015-03-13: qty 2

## 2015-03-13 MED ORDER — FENTANYL CITRATE 0.05 MG/ML IJ SOLN
INTRAMUSCULAR | Status: AC
  Filled 2015-03-13: qty 2

## 2015-03-13 MED ORDER — DEXAMETHASONE SODIUM PHOSPHATE 10 MG/ML IJ SOLN
INTRAMUSCULAR | Status: AC
  Filled 2015-03-13: qty 1

## 2015-03-13 MED ORDER — GLYCOPYRROLATE 0.2 MG/ML IJ SOLN
INTRAMUSCULAR | Status: AC
  Filled 2015-03-13: qty 3

## 2015-03-13 MED ORDER — VECURONIUM BROMIDE 10 MG IV SOLR
INTRAVENOUS | Status: AC
  Filled 2015-03-13: qty 10

## 2015-03-13 MED ORDER — LORAZEPAM 1 MG PO TABS
1.0000 mg | ORAL_TABLET | Freq: Every evening | ORAL | Status: DC | PRN
  Administered 2015-03-14: 1 mg via ORAL
  Filled 2015-03-13: qty 1

## 2015-03-13 MED ORDER — BSS PLUS IO SOLN
INTRAOCULAR | Status: AC
  Filled 2015-03-13: qty 500

## 2015-03-13 MED ORDER — PROPOFOL 10 MG/ML IV BOLUS
INTRAVENOUS | Status: DC | PRN
  Administered 2015-03-13: 20 mg via INTRAVENOUS
  Administered 2015-03-13: 180 mg via INTRAVENOUS

## 2015-03-13 MED ORDER — GLYCOPYRROLATE 0.2 MG/ML IJ SOLN
INTRAMUSCULAR | Status: DC | PRN
  Administered 2015-03-13: 0.6 mg via INTRAVENOUS

## 2015-03-13 MED ORDER — TRIAMCINOLONE ACETONIDE 40 MG/ML IJ SUSP
INTRAMUSCULAR | Status: AC
  Filled 2015-03-13: qty 5

## 2015-03-13 SURGICAL SUPPLY — 71 items
APL SRG 3 HI ABS STRL LF PLS (MISCELLANEOUS)
APPLICATOR DR MATTHEWS STRL (MISCELLANEOUS) IMPLANT
BALL CTTN LRG ABS STRL LF (GAUZE/BANDAGES/DRESSINGS) ×3
BLADE EYE CATARACT 19 1.4 BEAV (BLADE) IMPLANT
BLADE MVR KNIFE 19G (BLADE) IMPLANT
BLADE MVR KNIFE 20G (BLADE) IMPLANT
CANNULA DUAL BORE 23G (CANNULA) IMPLANT
CANNULA VLV SOFT TIP 25G (OPHTHALMIC) ×1 IMPLANT
CANNULA VLV SOFT TIP 25GA (OPHTHALMIC) ×3 IMPLANT
CORDS BIPOLAR (ELECTRODE) IMPLANT
COTTONBALL LRG STERILE PKG (GAUZE/BANDAGES/DRESSINGS) ×9 IMPLANT
COVER MAYO STAND STRL (DRAPES) ×3 IMPLANT
DRAPE INCISE 51X51 W/FILM STRL (DRAPES) ×3 IMPLANT
DRAPE OPHTHALMIC 77X100 STRL (CUSTOM PROCEDURE TRAY) ×3 IMPLANT
FILTER BLUE MILLIPORE (MISCELLANEOUS) IMPLANT
FILTER STRAW FLUID ASPIR (MISCELLANEOUS) IMPLANT
FORCEPS ECKARDT ILM 25G SERR (OPHTHALMIC RELATED) IMPLANT
FORCEPS GRIESHABER ILM 25G A (INSTRUMENTS) IMPLANT
GLOVE SS BIOGEL STRL SZ 6.5 (GLOVE) ×1 IMPLANT
GLOVE SS BIOGEL STRL SZ 7 (GLOVE) ×1 IMPLANT
GLOVE SUPERSENSE BIOGEL SZ 6.5 (GLOVE) ×2
GLOVE SUPERSENSE BIOGEL SZ 7 (GLOVE) ×2
GLOVE SURG 8.5 LATEX PF (GLOVE) ×3 IMPLANT
GLOVE SURG SS PI 7.0 STRL IVOR (GLOVE) ×2 IMPLANT
GOWN STRL REUS W/ TWL LRG LVL3 (GOWN DISPOSABLE) ×3 IMPLANT
GOWN STRL REUS W/TWL LRG LVL3 (GOWN DISPOSABLE) ×9
HANDLE PNEUMATIC FOR CONSTEL (OPHTHALMIC) IMPLANT
KIT BASIN OR (CUSTOM PROCEDURE TRAY) ×3 IMPLANT
KNIFE CRESCENT 2.5 55 ANG (BLADE) IMPLANT
MICROPICK 25G (MISCELLANEOUS) ×3
NDL 18GX1X1/2 (RX/OR ONLY) (NEEDLE) ×1 IMPLANT
NDL 25GX 5/8IN NON SAFETY (NEEDLE) ×1 IMPLANT
NDL FILTER BLUNT 18X1 1/2 (NEEDLE) ×1 IMPLANT
NDL HYPO 30X.5 LL (NEEDLE) ×1 IMPLANT
NEEDLE 18GX1X1/2 (RX/OR ONLY) (NEEDLE) ×3 IMPLANT
NEEDLE 25GX 5/8IN NON SAFETY (NEEDLE) ×3 IMPLANT
NEEDLE 27GAX1X1/2 (NEEDLE) IMPLANT
NEEDLE FILTER BLUNT 18X 1/2SAF (NEEDLE) ×2
NEEDLE FILTER BLUNT 18X1 1/2 (NEEDLE) ×1 IMPLANT
NEEDLE HYPO 30X.5 LL (NEEDLE) ×3 IMPLANT
NS IRRIG 1000ML POUR BTL (IV SOLUTION) ×3 IMPLANT
PACK VITRECTOMY CUSTOM (CUSTOM PROCEDURE TRAY) ×3 IMPLANT
PAD ARMBOARD 7.5X6 YLW CONV (MISCELLANEOUS) ×6 IMPLANT
PAK PIK VITRECTOMY CVS 25GA (OPHTHALMIC) ×3 IMPLANT
PENCIL BIPOLAR 25GA STR DISP (OPHTHALMIC RELATED) IMPLANT
PIC ILLUMINATED 25G (OPHTHALMIC) ×3
PICK MICROPICK 25G (MISCELLANEOUS) IMPLANT
PIK ILLUMINATED 25G (OPHTHALMIC) ×1 IMPLANT
PROBE LASER ILLUM FLEX CVD 25G (OPHTHALMIC) ×2 IMPLANT
REPL STRA BRUSH NDL (NEEDLE) IMPLANT
REPL STRA BRUSH NEEDLE (NEEDLE) IMPLANT
RESERVOIR BACK FLUSH (MISCELLANEOUS) IMPLANT
ROLLS DENTAL (MISCELLANEOUS) ×6 IMPLANT
SCISSORS TIP ADVANCED DSP 25GA (INSTRUMENTS) IMPLANT
SCRAPER DIAMOND 25GA (OPHTHALMIC RELATED) IMPLANT
SCRAPER DIAMOND DUST MEMBRANE (MISCELLANEOUS) IMPLANT
SPONGE SURGIFOAM ABS GEL 12-7 (HEMOSTASIS) ×3 IMPLANT
STOPCOCK 4 WAY LG BORE MALE ST (IV SETS) IMPLANT
SUT CHROMIC 7 0 TG140 8 (SUTURE) IMPLANT
SUT ETHILON 10 0 CS140 6 (SUTURE) IMPLANT
SUT ETHILON 9 0 TG140 8 (SUTURE) IMPLANT
SUT POLY NON ABSORB 10-0 8 STR (SUTURE) IMPLANT
SUT SILK 4 0 RB 1 (SUTURE) IMPLANT
SYR 20CC LL (SYRINGE) ×5 IMPLANT
SYR 5ML LL (SYRINGE) IMPLANT
SYR BULB 3OZ (MISCELLANEOUS) ×3 IMPLANT
SYR TB 1ML LUER SLIP (SYRINGE) ×5 IMPLANT
SYRINGE 10CC LL (SYRINGE) IMPLANT
TOWEL OR 17X24 6PK STRL BLUE (TOWEL DISPOSABLE) ×9 IMPLANT
WATER STERILE IRR 1000ML POUR (IV SOLUTION) ×3 IMPLANT
WIPE INSTRUMENT VISIWIPE 73X73 (MISCELLANEOUS) ×3 IMPLANT

## 2015-03-13 NOTE — Transfer of Care (Signed)
Immediate Anesthesia Transfer of Care Note  Patient: Brandon Pratt  Procedure(s) Performed: Procedure(s) with comments: PARS PLANA VITRECTOMY WITH 25 GAUGE (Left) LASER PHOTO ABLATION (Left) - Endolaser GAS/FLUID EXCHANGE (Left) MEMBRANE PEEL (Left)  Patient Location: PACU  Anesthesia Type:General  Level of Consciousness: awake and alert   Airway & Oxygen Therapy: Patient Spontanous Breathing  Post-op Assessment: Report given to RN, Post -op Vital signs reviewed and stable and Patient moving all extremities  Post vital signs: Reviewed and stable  Last Vitals:  Filed Vitals:   03/13/15 0853  BP: 132/91  Pulse: 100  Temp: 36.7 C  Resp: 20    Complications: No apparent anesthesia complications

## 2015-03-13 NOTE — Anesthesia Procedure Notes (Addendum)
Procedure Name: Intubation Date/Time: 03/13/2015 11:56 AM Performed by: Suzy Bouchard Pre-anesthesia Checklist: Patient identified, Timeout performed, Emergency Drugs available, Suction available and Patient being monitored Patient Re-evaluated:Patient Re-evaluated prior to inductionOxygen Delivery Method: Circle system utilized Preoxygenation: Pre-oxygenation with 100% oxygen Intubation Type: IV induction and Cricoid Pressure applied Ventilation: Mask ventilation with difficulty Laryngoscope Size: Miller and 2 Grade View: Grade II Tube type: Oral Tube size: 7.5 mm Number of attempts: 1 Airway Equipment and Method: Stylet and LTA kit utilized Placement Confirmation: positive ETCO2,  ETT inserted through vocal cords under direct vision,  breath sounds checked- equal and bilateral and CO2 detector Secured at: 23 cm Tube secured with: Tape Dental Injury: Teeth and Oropharynx as per pre-operative assessment

## 2015-03-13 NOTE — H&P (Signed)
I examined the patient today and there is no change in the medical status 

## 2015-03-13 NOTE — Anesthesia Preprocedure Evaluation (Signed)
Anesthesia Evaluation  Patient identified by MRN, date of birth, ID band Patient awake    Reviewed: Allergy & Precautions, NPO status , Patient's Chart, lab work & pertinent test results  History of Anesthesia Complications Negative for: history of anesthetic complications  Airway Mallampati: III  TM Distance: >3 FB Neck ROM: Full    Dental  (+) Edentulous Upper, Edentulous Lower   Pulmonary neg shortness of breath, neg sleep apnea, COPD COPD inhaler, neg recent URI, Current Smoker,  breath sounds clear to auscultation        Cardiovascular hypertension, Pt. on medications + CAD and + CABG Rhythm:Regular Rate:Normal     Neuro/Psych PSYCHIATRIC DISORDERS Anxiety Depression negative neurological ROS     GI/Hepatic Neg liver ROS, hiatal hernia, GERD-  Medicated and Controlled,  Endo/Other  negative endocrine ROS  Renal/GU negative Renal ROS     Musculoskeletal  (+) Arthritis -,   Abdominal   Peds  Hematology negative hematology ROS (+)   Anesthesia Other Findings   Reproductive/Obstetrics                             Anesthesia Physical Anesthesia Plan  ASA: III  Anesthesia Plan: General   Post-op Pain Management:    Induction: Intravenous  Airway Management Planned: Oral ETT  Additional Equipment: None  Intra-op Plan:   Post-operative Plan: Extubation in OR  Informed Consent: I have reviewed the patients History and Physical, chart, labs and discussed the procedure including the risks, benefits and alternatives for the proposed anesthesia with the patient or authorized representative who has indicated his/her understanding and acceptance.     Plan Discussed with: CRNA and Surgeon  Anesthesia Plan Comments:         Anesthesia Quick Evaluation

## 2015-03-13 NOTE — Anesthesia Postprocedure Evaluation (Signed)
  Anesthesia Post-op Note  Patient: Brandon Pratt  Procedure(s) Performed: Procedure(s) with comments: PARS PLANA VITRECTOMY WITH 25 GAUGE (Left) LASER PHOTO ABLATION (Left) - Endolaser GAS/FLUID EXCHANGE (Left) MEMBRANE PEEL (Left)  Patient Location: PACU  Anesthesia Type:General  Level of Consciousness: awake and alert   Airway and Oxygen Therapy: Patient Spontanous Breathing  Post-op Pain: mild  Post-op Assessment: Post-op Vital signs reviewed, Patient's Cardiovascular Status Stable, Respiratory Function Stable, Patent Airway, No signs of Nausea or vomiting and Pain level controlled  Post-op Vital Signs: Reviewed and stable  Last Vitals:  Filed Vitals:   03/13/15 1425  BP: 157/85  Pulse: 61  Temp: 36.7 C  Resp: 15    Complications: No apparent anesthesia complications

## 2015-03-13 NOTE — Brief Op Note (Signed)
Brief Operative note   Preoperative diagnosis:  vitreous hemorrhage left eye, posterior macular membranes left eye Postoperative diagnosis  * No Diagnosis Codes entered *  Procedures: Pars plana vitrectomy with membrane peel, laser, gas injection left eye  Surgeon:  Hayden Pedro, MD...  Assistant:  Deatra Ina SA    Anesthesia: General  Specimen: none  Estimated blood loss:  1cc  Complications: none  Patient sent to PACU in good condition  Composed by Hayden Pedro MD  Dictation number: [id number]

## 2015-03-14 DIAGNOSIS — H4312 Vitreous hemorrhage, left eye: Secondary | ICD-10-CM | POA: Diagnosis not present

## 2015-03-14 MED ORDER — BACITRACIN-POLYMYXIN B 500-10000 UNIT/GM OP OINT: 1.0000 "application " | TOPICAL_OINTMENT | Freq: Three times a day (TID) | OPHTHALMIC | Status: DC

## 2015-03-14 MED ORDER — PREDNISOLONE ACETATE 1 % OP SUSP: 1.0000 [drp] | Freq: Four times a day (QID) | OPHTHALMIC | Status: DC

## 2015-03-14 NOTE — Progress Notes (Signed)
03/14/2015, 6:17 AM  Mental Status:  Awake, Alert, Oriented  Anterior segment: Cornea  Clear    Anterior Chamber Clear    Lens:    Cataract  Intra Ocular Pressure 18 mmHg with Tonopen  Vitreous: Clear 30%gas bubble   Retina:  Attached Good laser reaction  Impression: Excellent result Retina attached Poor view  Final Diagnosis: Principal Problem:   Vitreous hemorrhage, left eye Active Problems:   Epiretinal membrane, left eye   Plan: start post operative eye drops.  Discharge to home.  Give post operative instructions.  Patient has new bottle of Cipro and will use this instead of being dispensed and antibiotic eye drop.  Hayden Pedro 03/14/2015, 6:17 AM

## 2015-03-14 NOTE — Op Note (Signed)
NAMEMILAD, BUBLITZ                 ACCOUNT NO.:  [id number]  MEDICAL RECORD NO.:  [id number]  LOCATION:  [id number]                        FACILITY:  [facility]  PHYSICIAN:  Chrystie Nose. Zigmund Daniel, M.D. DATE OF BIRTH:  08-May-1956  DATE OF PROCEDURE:  03/13/2015 DATE OF DISCHARGE:                              OPERATIVE REPORT   ADMISSION DIAGNOSES:  Vitreous hemorrhage, posterior hyaloid hemorrhage, branch vein occlusion, central vein occlusion in the left eye.  PROCEDURES:  Pars plana vitrectomy with membrane peel, retinal photocoagulation, gas-fluid exchange in the left eye.  SURGEON:  Chrystie Nose. Zigmund Daniel, MD  ASSISTANT:  Deatra Ina, SA  ANESTHESIA:  General.  DETAILS:  Usual prep and drape, a 25-gauge trocar was placed at 10, 2, and 4 o'clock with infusion at 4 o'clock.  Provisc placed on the corneal surface, and the BIOM viewing system was moved into place.  Pars plana vitrectomy was begun just behind the crystalline lens.  Dense vitreous material was seen.  This was carefully removed under low suction and rapid cutting.  The inspection of the macula showed a large thick covering of white material.  This was trapped with epiretinal membrane and posterior hyaloid.  The lighted pick and the vitreous cutter were used to engage the posterior membranes and stripped them from their attachments to the macula and out to the equator for 360 degrees.  The white material was easily removed at this point, and a branch vein occlusion was seen in the macular region with ghost vessels.  The vitrectomy was carried into the mid periphery and additional surface proliferation and vitreous membranes were removed.  Again, there was macular peeling with the lighted pick and the vitreous cutter.  The vitrectomy was carried to the far periphery with the Super wide BIOM viewing system.  Areas without laser were seen in the far periphery. The endolaser was positioned in the eye.  A sector photocoagulation  was performed around the area of branch vein occlusion.  Then, additional laser was done for a total of 1011 burns throughout the retina in a panretinal pattern.  The spot size was 1000 microns, the duration 0.1 seconds, power 300 mW.  Inspection of the macula was carried out again and appeared to be flat with no hole.  A 30% gas fluid exchange was then carried out.  The instruments were removed from the eye.  The 25-gauge trocars were removed from the eye.  The wounds were held and then tested and found to be secure.  Polymyxin and gentamicin were irrigated into [place] space.  Marcaine was injected around the globe for postop pain. Decadron 10 mg was injected into the lower subconjunctival space. Polysporin ophthalmic ointment, a patch and shield were placed.  Closing pressure was 10 with a [place] tonometer.  COMPLICATIONS:  None.  DURATION:  One hour.     Chrystie Nose. Zigmund Daniel, M.D.     JDM/MEDQ  D:  03/13/2015  T:  03/14/2015  Job:  [id number]

## 2015-03-14 NOTE — Discharge Summary (Signed)
Discharge summary not needed on [facility] patients per medical records. 

## 2015-03-14 NOTE — Progress Notes (Signed)
Pt has given discharge order. Pt is comfortable going home. Wife at bed side to drive pt home.

## 2015-03-15 ENCOUNTER — Encounter (HOSPITAL_COMMUNITY): Payer: Self-pay | Admitting: Ophthalmology

## 2015-03-20 ENCOUNTER — Encounter (INDEPENDENT_AMBULATORY_CARE_PROVIDER_SITE_OTHER): Payer: Medicare Other | Admitting: Ophthalmology

## 2015-03-20 DIAGNOSIS — H34812 Central retinal vein occlusion, left eye: Secondary | ICD-10-CM

## 2015-04-09 ENCOUNTER — Encounter (INDEPENDENT_AMBULATORY_CARE_PROVIDER_SITE_OTHER): Payer: Medicare Other | Admitting: Ophthalmology

## 2015-04-09 DIAGNOSIS — H34832 Tributary (branch) retinal vein occlusion, left eye: Secondary | ICD-10-CM

## 2015-06-20 ENCOUNTER — Encounter (INDEPENDENT_AMBULATORY_CARE_PROVIDER_SITE_OTHER): Payer: Medicare Other | Admitting: Ophthalmology

## 2015-06-20 DIAGNOSIS — H34812 Central retinal vein occlusion, left eye: Secondary | ICD-10-CM

## 2015-06-20 DIAGNOSIS — H35033 Hypertensive retinopathy, bilateral: Secondary | ICD-10-CM | POA: Diagnosis not present

## 2015-06-20 DIAGNOSIS — I10 Essential (primary) hypertension: Secondary | ICD-10-CM | POA: Diagnosis not present

## 2015-06-20 DIAGNOSIS — H34832 Tributary (branch) retinal vein occlusion, left eye: Secondary | ICD-10-CM | POA: Diagnosis not present

## 2015-06-20 DIAGNOSIS — H43813 Vitreous degeneration, bilateral: Secondary | ICD-10-CM

## 2015-06-20 DIAGNOSIS — H2513 Age-related nuclear cataract, bilateral: Secondary | ICD-10-CM | POA: Diagnosis not present

## 2015-11-30 ENCOUNTER — Ambulatory Visit (INDEPENDENT_AMBULATORY_CARE_PROVIDER_SITE_OTHER): Payer: Medicare Other | Admitting: Cardiology

## 2015-11-30 ENCOUNTER — Encounter: Payer: Self-pay | Admitting: Cardiology

## 2015-11-30 ENCOUNTER — Encounter: Payer: Self-pay | Admitting: *Deleted

## 2015-11-30 VITALS — BP 146/98 | HR 76 | Ht 70.0 in | Wt 210.0 lb

## 2015-11-30 DIAGNOSIS — I25119 Atherosclerotic heart disease of native coronary artery with unspecified angina pectoris: Secondary | ICD-10-CM | POA: Diagnosis not present

## 2015-11-30 DIAGNOSIS — E785 Hyperlipidemia, unspecified: Secondary | ICD-10-CM

## 2015-11-30 DIAGNOSIS — Z720 Tobacco use: Secondary | ICD-10-CM

## 2015-11-30 DIAGNOSIS — I10 Essential (primary) hypertension: Secondary | ICD-10-CM

## 2015-11-30 NOTE — Patient Instructions (Signed)
Continue all current medications. Your physician wants you to follow up in:  1 year.  You will receive a reminder letter in the mail one-two months in advance.  If you don't receive a letter, please call our office to schedule the follow up appointment   

## 2015-11-30 NOTE — Progress Notes (Signed)
Cardiology Office Note  Date: 11/30/2015   ID: Brandon Pratt, DOB [birth year], MRN [id number]  PCP: Deloria Lair, MD  Primary Cardiologist: Rozann Lesches, MD   Chief Complaint  Patient presents with  . Coronary Artery Disease    History of Present Illness: Brandon Pratt is a 59 y.o. male last seen in February 2015. He presents for a follow-up visit. Since last encounter he does report episodes of angina describing chest discomfort as well as neck and jaw discomfort. He states that this has been sporadic, not precipitated by any particular activity, not always improved with nitroglycerin. We have discussed the addition of Imdur to see if this would be helpful, but he has preferred to hold off.  Most recent cardiac catheterization was in 2014 as outlined below. He has been managed medically with known graft disease.  He continues on statin therapy and had recent lab work done with Dr. Scotty Court which we are requesting. He reports no problems with his cardiac regimen including aspirin, lisinopril, Pravachol, and as needed nitroglycerin.  Past Medical History  Diagnosis Date  . Depression   . GERD (gastroesophageal reflux disease)   . Chronic pain syndrome   . Essential hypertension, benign   . Mixed hyperlipidemia   . Coronary atherosclerosis of native coronary artery     Multivessel, occluded SVG to RCA 2/14 - managed medically  . History of hiatal hernia   . Arthritis     Current Outpatient Prescriptions  Medication Sig Dispense Refill  . aspirin EC 81 MG tablet Take 1 tablet (81 mg total) by mouth daily.    Marland Kitchen lisinopril (PRINIVIL,ZESTRIL) 20 MG tablet Take 20 mg by mouth every morning.    Marland Kitchen LORazepam (ATIVAN) 1 MG tablet Take 1 mg by mouth at bedtime as needed.    . Multiple Vitamin (MULTIVITAMIN) tablet Take 1 tablet by mouth daily.    . nitroGLYCERIN (NITROSTAT) 0.4 MG SL tablet Place 0.4 mg under the tongue every 5 (five) minutes as needed.    Marland Kitchen omeprazole (PRILOSEC)  20 MG capsule Take 20 mg by mouth every morning.     . pravastatin (PRAVACHOL) 40 MG tablet Take 40 mg by mouth every evening.     . ranitidine (ZANTAC) 150 MG tablet Take 300 mg by mouth at bedtime.     No current facility-administered medications for this visit.   Allergies:  Codeine   Social History: The patient  reports that he has been smoking Cigarettes.  He has a 100 pack-year smoking history. He quit smokeless tobacco use about 47 years ago. His smokeless tobacco use included Chew. He reports that he drinks alcohol. He reports that he uses illicit drugs (Marijuana).   ROS:  Please see the history of present illness. Otherwise, complete review of systems is positive for arthritic pains.  All other systems are reviewed and negative.   Physical Exam: VS:  BP 146/98 mmHg  Pulse 76  Ht 5\' 10"  (1.778 m)  Wt 210 lb (95.255 kg)  BMI 30.13 kg/m2  SpO2 98%, BMI Body mass index is 30.13 kg/(m^2).  Wt Readings from Last 3 Encounters:  11/30/15 210 lb (95.255 kg)  03/13/15 205 lb (92.987 kg)  03/12/15 205 lb 14.4 oz (93.396 kg)    Comfortable at rest. No active chest pain. HEENT: Conjunctiva and lids normal, oropharynx clear.  Neck: Supple, no elevated JVP or carotid bruits, no thyromegaly.  Lungs: Clear to auscultation, nonlabored breathing at rest.  Cardiac: Regular rate and rhythm,  no S3 or significant systolic murmur, no pericardial rub.  Abdomen: Soft, nontender, bowel sounds present.  Extremities: No pitting edema, distal pulses 2+.   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   Recent Labwork: 03/12/2015: BUN 6; Creatinine, Ser 0.86; Hemoglobin 17.2*; Platelets 205; Potassium 4.1; Sodium 138   Other Studies Reviewed Today:  Cardiac catheterization in February 2014 showed native vessel CAD with patent LIMA to LAD, patent SVG to OM, patent SVG to diagonal, and occluded SVG to RCA, with a chronically  occluded RCA associated with left to right collaterals.  Assessment and Plan:  1. CAD status post CABG with graft disease as outlined above. Symptomatically stable with intermittent angina on medical therapy. Could consider adding Imdur next if symptoms increase, although for now he was comfortable with observation.  2. Hyperlipidemia, on Pravachol. Requesting most recent labwork from Dr. Scotty Court.  3. Ongoing tobacco abuse, has not been able to quit.  4. Essential hypertension, no changes made to current regimen. Keep follow-up with Dr. Scotty Court.  Current medicines were reviewed with the patient today.  Disposition: FU with me in 1 year.   Signed, Satira Sark, MD, [facility] 11/30/2015 10:28 AM    [facility] [place], [place], [place] [address] Phone: [phone]; Fax: [phone]

## 2015-12-24 ENCOUNTER — Ambulatory Visit (INDEPENDENT_AMBULATORY_CARE_PROVIDER_SITE_OTHER): Payer: Medicare Other | Admitting: Ophthalmology

## 2015-12-24 DIAGNOSIS — H34812 Central retinal vein occlusion, left eye, with macular edema: Secondary | ICD-10-CM | POA: Diagnosis not present

## 2015-12-24 DIAGNOSIS — H43811 Vitreous degeneration, right eye: Secondary | ICD-10-CM | POA: Diagnosis not present

## 2015-12-24 DIAGNOSIS — H34832 Tributary (branch) retinal vein occlusion, left eye, with macular edema: Secondary | ICD-10-CM | POA: Diagnosis not present

## 2015-12-24 DIAGNOSIS — H35033 Hypertensive retinopathy, bilateral: Secondary | ICD-10-CM | POA: Diagnosis not present

## 2015-12-24 DIAGNOSIS — I10 Essential (primary) hypertension: Secondary | ICD-10-CM

## 2016-01-22 ENCOUNTER — Encounter (INDEPENDENT_AMBULATORY_CARE_PROVIDER_SITE_OTHER): Payer: Medicare Other | Admitting: Ophthalmology

## 2016-01-22 DIAGNOSIS — H34832 Tributary (branch) retinal vein occlusion, left eye, with macular edema: Secondary | ICD-10-CM

## 2016-01-22 DIAGNOSIS — H34812 Central retinal vein occlusion, left eye, with macular edema: Secondary | ICD-10-CM

## 2016-01-22 DIAGNOSIS — H35033 Hypertensive retinopathy, bilateral: Secondary | ICD-10-CM

## 2016-01-22 DIAGNOSIS — H43813 Vitreous degeneration, bilateral: Secondary | ICD-10-CM | POA: Diagnosis not present

## 2016-01-22 DIAGNOSIS — I10 Essential (primary) hypertension: Secondary | ICD-10-CM | POA: Diagnosis not present

## 2016-02-15 ENCOUNTER — Encounter (INDEPENDENT_AMBULATORY_CARE_PROVIDER_SITE_OTHER): Payer: Medicare Other | Admitting: Ophthalmology

## 2016-02-15 DIAGNOSIS — H35033 Hypertensive retinopathy, bilateral: Secondary | ICD-10-CM | POA: Diagnosis not present

## 2016-02-15 DIAGNOSIS — H43813 Vitreous degeneration, bilateral: Secondary | ICD-10-CM | POA: Diagnosis not present

## 2016-02-15 DIAGNOSIS — I10 Essential (primary) hypertension: Secondary | ICD-10-CM | POA: Diagnosis not present

## 2016-02-15 DIAGNOSIS — H34812 Central retinal vein occlusion, left eye, with macular edema: Secondary | ICD-10-CM | POA: Diagnosis not present

## 2016-02-15 DIAGNOSIS — H34832 Tributary (branch) retinal vein occlusion, left eye, with macular edema: Secondary | ICD-10-CM

## 2016-02-19 ENCOUNTER — Encounter (INDEPENDENT_AMBULATORY_CARE_PROVIDER_SITE_OTHER): Payer: Medicare Other | Admitting: Ophthalmology

## 2016-02-19 DIAGNOSIS — H34832 Tributary (branch) retinal vein occlusion, left eye, with macular edema: Secondary | ICD-10-CM

## 2016-02-20 ENCOUNTER — Encounter (INDEPENDENT_AMBULATORY_CARE_PROVIDER_SITE_OTHER): Payer: Medicare Other | Admitting: Ophthalmology

## 2016-02-20 DIAGNOSIS — H34832 Tributary (branch) retinal vein occlusion, left eye, with macular edema: Secondary | ICD-10-CM

## 2016-04-02 ENCOUNTER — Encounter (INDEPENDENT_AMBULATORY_CARE_PROVIDER_SITE_OTHER): Payer: Medicare Other | Admitting: Ophthalmology

## 2016-04-02 DIAGNOSIS — H2513 Age-related nuclear cataract, bilateral: Secondary | ICD-10-CM | POA: Diagnosis not present

## 2016-04-02 DIAGNOSIS — H43811 Vitreous degeneration, right eye: Secondary | ICD-10-CM

## 2016-04-02 DIAGNOSIS — H34812 Central retinal vein occlusion, left eye, with macular edema: Secondary | ICD-10-CM

## 2016-04-02 DIAGNOSIS — I10 Essential (primary) hypertension: Secondary | ICD-10-CM | POA: Diagnosis not present

## 2016-04-02 DIAGNOSIS — H338 Other retinal detachments: Secondary | ICD-10-CM

## 2016-04-02 DIAGNOSIS — H34832 Tributary (branch) retinal vein occlusion, left eye, with macular edema: Secondary | ICD-10-CM | POA: Diagnosis not present

## 2016-04-02 DIAGNOSIS — H35033 Hypertensive retinopathy, bilateral: Secondary | ICD-10-CM | POA: Diagnosis not present

## 2016-04-02 NOTE — H&P (Signed)
Brandon Pratt is an 60 y.o. male.   Chief Complaint:flashes of light left eye for two weeks HPI: Patient with central vein occlusion and vitrectomy in April 2016  Now has light flashes  Past Medical History  Diagnosis Date  . Depression   . GERD (gastroesophageal reflux disease)   . Chronic pain syndrome   . Essential hypertension, benign   . Mixed hyperlipidemia   . Coronary atherosclerosis of native coronary artery     Multivessel, occluded SVG to RCA 2/14 - managed medically  . History of hiatal hernia   . Arthritis     Past Surgical History  Procedure Laterality Date  . Cervical spine surgery      with fusions  . Right shoulder surgery    . Left shoulder surgery    . Appendectomy    . Hand surgery Right     carpatal tunnel x2  . Coronary artery bypass graft  12/2009    LIMA to LAD, SVG to OM, SVG to diagonal, SVG to RCA  . Elbow surgery      "moved nerves over"  . Carpal tunnel release Left   . Pars plana vitrectomy Left 03/13/2015    Procedure: PARS PLANA VITRECTOMY WITH 25 GAUGE;  Surgeon: Hayden Pedro, MD;  Location: [facility];  Service: Ophthalmology;  Laterality: Left;  . Laser photo ablation Left 03/13/2015    Procedure: LASER PHOTO ABLATION;  Surgeon: Hayden Pedro, MD;  Location: [facility];  Service: Ophthalmology;  Laterality: Left;  Endolaser  . Gas/fluid exchange Left 03/13/2015    Procedure: GAS/FLUID EXCHANGE;  Surgeon: Hayden Pedro, MD;  Location: [facility];  Service: Ophthalmology;  Laterality: Left;  Marland Kitchen Membrane peel Left 03/13/2015    Procedure: MEMBRANE PEEL;  Surgeon: Hayden Pedro, MD;  Location: [facility];  Service: Ophthalmology;  Laterality: Left;    Family History  Problem Relation Age of Onset  . Hypertension Mother   . Diabetes Mother   . Other Mother     Pacemaker  . Stroke Father    Social History:  reports that he has been smoking Cigarettes.  He has a 100 pack-year smoking history. He quit smokeless tobacco use about 47 years ago. His smokeless  tobacco use included Chew. He reports that he drinks alcohol. He reports that he uses illicit drugs (Marijuana).  Allergies:  Allergies  Allergen Reactions  . Codeine     REACTION: Upset stomach    No prescriptions prior to admission    Review of systems otherwise negative  There were no vitals taken for this visit.  Physical exam: Mental status: oriented x3. Eyes: See eye exam associated with this date of surgery in media tab.  Scanned in by scanning center Ears, Nose, Throat: within normal limits Neck: Within Normal limits General: within normal limits Chest: Within normal limits Breast: deferred Heart: Within normal limits Abdomen: Within normal limits GU: deferred Extremities: within normal limits Skin: within normal limits  Assessment/Plan Rhegmatogenous retinal detachment left eye Plan: To [facility] for Scleral buckle, gas injection and Possible pars plana vitrectomy left eye  Hayden Pedro 04/02/2016, 12:36 PM

## 2016-04-04 ENCOUNTER — Encounter (HOSPITAL_COMMUNITY): Payer: Self-pay | Admitting: *Deleted

## 2016-04-08 ENCOUNTER — Ambulatory Visit (HOSPITAL_COMMUNITY)
Admission: RE | Admit: 2016-04-08 | Discharge: 2016-04-09 | Disposition: A | Discharge status: Home / Self Care | Payer: Medicare Other | Source: Ambulatory Visit | Attending: Ophthalmology | Admitting: Ophthalmology

## 2016-04-08 ENCOUNTER — Encounter (HOSPITAL_COMMUNITY)
Admission: RE | Disposition: A | Discharge status: Home / Self Care | Payer: Self-pay | Source: Ambulatory Visit | Attending: Ophthalmology

## 2016-04-08 ENCOUNTER — Ambulatory Visit (HOSPITAL_COMMUNITY): Payer: Medicare Other | Admitting: Anesthesiology

## 2016-04-08 ENCOUNTER — Encounter (HOSPITAL_COMMUNITY): Payer: Self-pay | Admitting: General Practice

## 2016-04-08 DIAGNOSIS — E785 Hyperlipidemia, unspecified: Secondary | ICD-10-CM | POA: Insufficient documentation

## 2016-04-08 DIAGNOSIS — H33002 Unspecified retinal detachment with retinal break, left eye: Secondary | ICD-10-CM | POA: Diagnosis not present

## 2016-04-08 DIAGNOSIS — F1290 Cannabis use, unspecified, uncomplicated: Secondary | ICD-10-CM | POA: Diagnosis not present

## 2016-04-08 DIAGNOSIS — G8929 Other chronic pain: Secondary | ICD-10-CM | POA: Diagnosis not present

## 2016-04-08 DIAGNOSIS — M1990 Unspecified osteoarthritis, unspecified site: Secondary | ICD-10-CM | POA: Insufficient documentation

## 2016-04-08 DIAGNOSIS — I2510 Atherosclerotic heart disease of native coronary artery without angina pectoris: Secondary | ICD-10-CM | POA: Insufficient documentation

## 2016-04-08 DIAGNOSIS — Z833 Family history of diabetes mellitus: Secondary | ICD-10-CM | POA: Insufficient documentation

## 2016-04-08 DIAGNOSIS — F17200 Nicotine dependence, unspecified, uncomplicated: Secondary | ICD-10-CM | POA: Diagnosis not present

## 2016-04-08 DIAGNOSIS — I10 Essential (primary) hypertension: Secondary | ICD-10-CM | POA: Insufficient documentation

## 2016-04-08 DIAGNOSIS — H338 Other retinal detachments: Secondary | ICD-10-CM | POA: Diagnosis not present

## 2016-04-08 DIAGNOSIS — F418 Other specified anxiety disorders: Secondary | ICD-10-CM | POA: Diagnosis not present

## 2016-04-08 DIAGNOSIS — Z951 Presence of aortocoronary bypass graft: Secondary | ICD-10-CM | POA: Diagnosis not present

## 2016-04-08 DIAGNOSIS — K449 Diaphragmatic hernia without obstruction or gangrene: Secondary | ICD-10-CM | POA: Diagnosis not present

## 2016-04-08 DIAGNOSIS — F329 Major depressive disorder, single episode, unspecified: Secondary | ICD-10-CM | POA: Insufficient documentation

## 2016-04-08 DIAGNOSIS — K219 Gastro-esophageal reflux disease without esophagitis: Secondary | ICD-10-CM | POA: Diagnosis not present

## 2016-04-08 DIAGNOSIS — Z823 Family history of stroke: Secondary | ICD-10-CM | POA: Insufficient documentation

## 2016-04-08 DIAGNOSIS — Z8249 Family history of ischemic heart disease and other diseases of the circulatory system: Secondary | ICD-10-CM | POA: Insufficient documentation

## 2016-04-08 DIAGNOSIS — Z885 Allergy status to narcotic agent status: Secondary | ICD-10-CM | POA: Diagnosis not present

## 2016-04-08 HISTORY — PX: SCLERAL BUCKLE: SHX5340

## 2016-04-08 HISTORY — DX: Personal history of other diseases of the respiratory system: Z87.09

## 2016-04-08 HISTORY — PX: LASER PHOTO ABLATION: SHX5942

## 2016-04-08 HISTORY — PX: GAS/FLUID EXCHANGE: SHX5334

## 2016-04-08 HISTORY — DX: Pain in unspecified joint: M25.50

## 2016-04-08 LAB — CBC
HEMATOCRIT: 48.1 % (ref 39.0–52.0)
HEMOGLOBIN: 16.6 g/dL (ref 13.0–17.0)
MCH: 32.8 pg (ref 26.0–34.0)
MCHC: 34.5 g/dL (ref 30.0–36.0)
MCV: 95.1 fL (ref 78.0–100.0)
Platelets: 218 10*3/uL (ref 150–400)
RBC: 5.06 MIL/uL (ref 4.22–5.81)
RDW: 12.9 % (ref 11.5–15.5)
WBC: 10 10*3/uL (ref 4.0–10.5)

## 2016-04-08 LAB — BASIC METABOLIC PANEL
Anion gap: 11 (ref 5–15)
BUN: 6 mg/dL (ref 6–20)
CHLORIDE: 102 mmol/L (ref 101–111)
CO2: 26 mmol/L (ref 22–32)
Calcium: 9.4 mg/dL (ref 8.9–10.3)
Creatinine, Ser: 0.84 mg/dL (ref 0.61–1.24)
GFR calc non Af Amer: >60 mL/min (ref >60)
Glucose, Bld: 102 mg/dL — ABNORMAL HIGH (ref 65–99)
POTASSIUM: 4.1 mmol/L (ref 3.5–5.1)
Sodium: 139 mmol/L (ref 135–145)

## 2016-04-08 SURGERY — SCLERAL BUCKLE
Anesthesia: General | Site: Eye | Laterality: Left

## 2016-04-08 MED ORDER — TEMAZEPAM 15 MG PO CAPS: 15.0000 mg | ORAL_CAPSULE | Freq: Every evening | ORAL | Status: DC | PRN

## 2016-04-08 MED ORDER — ALBUTEROL SULFATE HFA 108 (90 BASE) MCG/ACT IN AERS
INHALATION_SPRAY | RESPIRATORY_TRACT | Status: AC
  Filled 2016-04-08: qty 6.7

## 2016-04-08 MED ORDER — ROCURONIUM BROMIDE 100 MG/10ML IV SOLN
INTRAVENOUS | Status: DC | PRN
  Administered 2016-04-08 (×2): 10 mg via INTRAVENOUS
  Administered 2016-04-08: 20 mg via INTRAVENOUS
  Administered 2016-04-08: 10 mg via INTRAVENOUS

## 2016-04-08 MED ORDER — BRIMONIDINE TARTRATE 0.2 % OP SOLN
1.0000 [drp] | Freq: Two times a day (BID) | OPHTHALMIC | Status: DC
  Filled 2016-04-08: qty 5

## 2016-04-08 MED ORDER — EPINEPHRINE HCL 1 MG/ML IJ SOLN
INTRAMUSCULAR | Status: AC
  Filled 2016-04-08: qty 1

## 2016-04-08 MED ORDER — CEFAZOLIN SODIUM-DEXTROSE 2-4 GM/100ML-% IV SOLN
2.0000 g | INTRAVENOUS | Status: AC
  Administered 2016-04-08: 2 g via INTRAVENOUS
  Filled 2016-04-08: qty 100

## 2016-04-08 MED ORDER — ACETAZOLAMIDE SODIUM 500 MG IJ SOLR
500.0000 mg | Freq: Once | INTRAMUSCULAR | Status: AC
  Administered 2016-04-09: 500 mg via INTRAVENOUS
  Filled 2016-04-08: qty 500

## 2016-04-08 MED ORDER — BSS IO SOLN
INTRAOCULAR | Status: AC
  Filled 2016-04-08: qty 15

## 2016-04-08 MED ORDER — BSS IO SOLN
INTRAOCULAR | Status: DC | PRN
  Administered 2016-04-08 (×2): 15 mL

## 2016-04-08 MED ORDER — SUCCINYLCHOLINE CHLORIDE 20 MG/ML IJ SOLN
INTRAMUSCULAR | Status: DC | PRN
  Administered 2016-04-08: 120 mg via INTRAVENOUS

## 2016-04-08 MED ORDER — TETRACAINE HCL 0.5 % OP SOLN
2.0000 [drp] | Freq: Once | OPHTHALMIC | Status: AC
  Administered 2016-04-09: 2 [drp] via OPHTHALMIC
  Filled 2016-04-08: qty 2

## 2016-04-08 MED ORDER — PHENYLEPHRINE HCL 2.5 % OP SOLN
1.0000 [drp] | OPHTHALMIC | Status: AC
  Administered 2016-04-08 (×3): 1 [drp] via OPHTHALMIC
  Filled 2016-04-08: qty 2

## 2016-04-08 MED ORDER — ADULT MULTIVITAMIN W/MINERALS CH
1.0000 | ORAL_TABLET | Freq: Every day | ORAL | Status: DC
  Filled 2016-04-08: qty 1

## 2016-04-08 MED ORDER — ONDANSETRON HCL 4 MG/2ML IJ SOLN
INTRAMUSCULAR | Status: AC
  Filled 2016-04-08: qty 2

## 2016-04-08 MED ORDER — SODIUM CHLORIDE 0.9 % IV SOLN
INTRAVENOUS | Status: DC
  Administered 2016-04-08 (×3): via INTRAVENOUS

## 2016-04-08 MED ORDER — LIDOCAINE HCL 2 % IJ SOLN
INTRAMUSCULAR | Status: AC
  Filled 2016-04-08: qty 20

## 2016-04-08 MED ORDER — ACETAZOLAMIDE SODIUM 500 MG IJ SOLR
INTRAMUSCULAR | Status: AC
  Filled 2016-04-08: qty 500

## 2016-04-08 MED ORDER — HYDROCODONE-ACETAMINOPHEN 7.5-325 MG PO TABS: 1.0000 | ORAL_TABLET | Freq: Once | ORAL | Status: DC | PRN

## 2016-04-08 MED ORDER — LATANOPROST 0.005 % OP SOLN
1.0000 [drp] | Freq: Every day | OPHTHALMIC | Status: DC
  Administered 2016-04-09: 1 [drp] via OPHTHALMIC
  Filled 2016-04-08: qty 2.5

## 2016-04-08 MED ORDER — ACETAMINOPHEN 325 MG PO TABS: 325.0000 mg | ORAL_TABLET | ORAL | Status: DC | PRN

## 2016-04-08 MED ORDER — LISINOPRIL 20 MG PO TABS: 20.0000 mg | ORAL_TABLET | ORAL | Status: DC

## 2016-04-08 MED ORDER — CYCLOPENTOLATE HCL 1 % OP SOLN
1.0000 [drp] | OPHTHALMIC | Status: AC
  Administered 2016-04-08 (×3): 1 [drp] via OPHTHALMIC
  Filled 2016-04-08: qty 2

## 2016-04-08 MED ORDER — HYDROCODONE-ACETAMINOPHEN 5-325 MG PO TABS
1.0000 | ORAL_TABLET | ORAL | Status: DC | PRN
  Administered 2016-04-08 – 2016-04-09 (×2): 2 via ORAL
  Filled 2016-04-08 (×2): qty 2

## 2016-04-08 MED ORDER — NITROGLYCERIN 0.4 MG SL SUBL: 0.4000 mg | SUBLINGUAL_TABLET | SUBLINGUAL | Status: DC | PRN

## 2016-04-08 MED ORDER — SODIUM CHLORIDE 0.45 % IV SOLN: INTRAVENOUS | Status: DC

## 2016-04-08 MED ORDER — LIDOCAINE HCL (CARDIAC) 20 MG/ML IV SOLN
INTRAVENOUS | Status: DC | PRN
  Administered 2016-04-08: 100 mg via INTRAVENOUS
  Administered 2016-04-08: 100 mg via INTRATRACHEAL

## 2016-04-08 MED ORDER — BUPIVACAINE HCL (PF) 0.75 % IJ SOLN
INTRAMUSCULAR | Status: AC
  Filled 2016-04-08: qty 10

## 2016-04-08 MED ORDER — PROPOFOL 10 MG/ML IV BOLUS
INTRAVENOUS | Status: AC
  Filled 2016-04-08: qty 20

## 2016-04-08 MED ORDER — BSS PLUS IO SOLN
INTRAOCULAR | Status: AC
  Filled 2016-04-08: qty 500

## 2016-04-08 MED ORDER — BACITRACIN-POLYMYXIN B 500-10000 UNIT/GM OP OINT
TOPICAL_OINTMENT | OPHTHALMIC | Status: DC | PRN
  Administered 2016-04-08: 1 via OPHTHALMIC

## 2016-04-08 MED ORDER — GATIFLOXACIN 0.5 % OP SOLN
1.0000 [drp] | Freq: Four times a day (QID) | OPHTHALMIC | Status: DC
  Administered 2016-04-09: 1 [drp] via OPHTHALMIC
  Filled 2016-04-08: qty 2.5

## 2016-04-08 MED ORDER — SODIUM HYALURONATE 10 MG/ML IO SOLN
INTRAOCULAR | Status: AC
  Filled 2016-04-08: qty 0.85

## 2016-04-08 MED ORDER — GATIFLOXACIN 0.5 % OP SOLN
1.0000 [drp] | OPHTHALMIC | Status: AC
  Administered 2016-04-08 (×3): 1 [drp] via OPHTHALMIC
  Filled 2016-04-08: qty 2.5

## 2016-04-08 MED ORDER — FAMOTIDINE 10 MG PO TABS
10.0000 mg | ORAL_TABLET | Freq: Every day | ORAL | Status: DC
  Administered 2016-04-08: 10 mg via ORAL
  Filled 2016-04-08: qty 1

## 2016-04-08 MED ORDER — CEFTAZIDIME 1 G IJ SOLR
INTRAMUSCULAR | Status: AC
  Filled 2016-04-08: qty 1

## 2016-04-08 MED ORDER — FENTANYL CITRATE (PF) 100 MCG/2ML IJ SOLN
INTRAMUSCULAR | Status: DC | PRN
  Administered 2016-04-08: 50 ug via INTRAVENOUS
  Administered 2016-04-08: 150 ug via INTRAVENOUS

## 2016-04-08 MED ORDER — FENTANYL CITRATE (PF) 250 MCG/5ML IJ SOLN
INTRAMUSCULAR | Status: AC
  Filled 2016-04-08: qty 5

## 2016-04-08 MED ORDER — ONDANSETRON HCL 4 MG/2ML IJ SOLN: 4.0000 mg | Freq: Once | INTRAMUSCULAR | Status: DC | PRN

## 2016-04-08 MED ORDER — PANTOPRAZOLE SODIUM 40 MG PO TBEC
40.0000 mg | DELAYED_RELEASE_TABLET | Freq: Every day | ORAL | Status: DC
  Administered 2016-04-08: 40 mg via ORAL
  Filled 2016-04-08: qty 1

## 2016-04-08 MED ORDER — ALBUTEROL SULFATE HFA 108 (90 BASE) MCG/ACT IN AERS
INHALATION_SPRAY | RESPIRATORY_TRACT | Status: DC | PRN
  Administered 2016-04-08: 2 via RESPIRATORY_TRACT

## 2016-04-08 MED ORDER — ATROPINE SULFATE 1 % OP SOLN
OPHTHALMIC | Status: DC | PRN
  Administered 2016-04-08: 1 [drp] via OPHTHALMIC

## 2016-04-08 MED ORDER — PRAVASTATIN SODIUM 40 MG PO TABS
40.0000 mg | ORAL_TABLET | Freq: Every evening | ORAL | Status: DC
  Administered 2016-04-08: 40 mg via ORAL
  Filled 2016-04-08: qty 1

## 2016-04-08 MED ORDER — TROPICAMIDE 1 % OP SOLN
1.0000 [drp] | OPHTHALMIC | Status: AC
  Administered 2016-04-08 (×3): 1 [drp] via OPHTHALMIC
  Filled 2016-04-08: qty 3

## 2016-04-08 MED ORDER — TRIAMCINOLONE ACETONIDE 40 MG/ML IJ SUSP
INTRAMUSCULAR | Status: AC
  Filled 2016-04-08: qty 5

## 2016-04-08 MED ORDER — LORAZEPAM 1 MG PO TABS: 1.0000 mg | ORAL_TABLET | Freq: Two times a day (BID) | ORAL | Status: DC | PRN

## 2016-04-08 MED ORDER — ASPIRIN EC 81 MG PO TBEC
81.0000 mg | DELAYED_RELEASE_TABLET | Freq: Every day | ORAL | Status: DC
  Administered 2016-04-08: 81 mg via ORAL
  Filled 2016-04-08: qty 1

## 2016-04-08 MED ORDER — BACITRACIN-POLYMYXIN B 500-10000 UNIT/GM OP OINT
1.0000 "application " | TOPICAL_OINTMENT | Freq: Three times a day (TID) | OPHTHALMIC | Status: DC
  Administered 2016-04-09: 1 via OPHTHALMIC
  Filled 2016-04-08: qty 3.5

## 2016-04-08 MED ORDER — DEXAMETHASONE SODIUM PHOSPHATE 10 MG/ML IJ SOLN
INTRAMUSCULAR | Status: DC | PRN
  Administered 2016-04-08: 10 mg

## 2016-04-08 MED ORDER — PROPOFOL 10 MG/ML IV BOLUS
INTRAVENOUS | Status: DC | PRN
  Administered 2016-04-08: 40 mg via INTRAVENOUS
  Administered 2016-04-08: 160 mg via INTRAVENOUS

## 2016-04-08 MED ORDER — SUGAMMADEX SODIUM 200 MG/2ML IV SOLN
INTRAVENOUS | Status: AC
  Filled 2016-04-08: qty 2

## 2016-04-08 MED ORDER — KETOROLAC TROMETHAMINE 30 MG/ML IJ SOLN: 30.0000 mg | Freq: Once | INTRAMUSCULAR | Status: DC

## 2016-04-08 MED ORDER — VECURONIUM BROMIDE 10 MG IV SOLR
INTRAVENOUS | Status: DC | PRN
  Administered 2016-04-08 (×3): 1 mg via INTRAVENOUS

## 2016-04-08 MED ORDER — STERILE WATER FOR INJECTION IJ SOLN
INTRAMUSCULAR | Status: AC
  Filled 2016-04-08: qty 20

## 2016-04-08 MED ORDER — ROCURONIUM BROMIDE 50 MG/5ML IV SOLN
INTRAVENOUS | Status: AC
  Filled 2016-04-08: qty 1

## 2016-04-08 MED ORDER — MORPHINE SULFATE (PF) 2 MG/ML IV SOLN
1.0000 mg | INTRAVENOUS | Status: AC | PRN
  Administered 2016-04-08: 2 mg via INTRAVENOUS
  Administered 2016-04-08: 4 mg via INTRAVENOUS
  Filled 2016-04-08: qty 1
  Filled 2016-04-08: qty 2

## 2016-04-08 MED ORDER — ONDANSETRON HCL 4 MG/2ML IJ SOLN
INTRAMUSCULAR | Status: DC | PRN
  Administered 2016-04-08: 4 mg via INTRAVENOUS

## 2016-04-08 MED ORDER — MAGNESIUM HYDROXIDE 400 MG/5ML PO SUSP
15.0000 mL | Freq: Four times a day (QID) | ORAL | Status: DC | PRN
Start: 2016-04-08 — End: 2016-04-09

## 2016-04-08 MED ORDER — PHENYLEPHRINE HCL 10 % OP SOLN
1.0000 [drp] | Freq: Once | OPHTHALMIC | Status: AC
  Administered 2016-04-08: 1 [drp] via OPHTHALMIC
  Filled 2016-04-08: qty 5

## 2016-04-08 MED ORDER — STERILE WATER FOR INJECTION IJ SOLN
INTRAMUSCULAR | Status: DC | PRN
  Administered 2016-04-08: 20 mL

## 2016-04-08 MED ORDER — DEXAMETHASONE SODIUM PHOSPHATE 10 MG/ML IJ SOLN
INTRAMUSCULAR | Status: AC
  Filled 2016-04-08: qty 1

## 2016-04-08 MED ORDER — SUGAMMADEX SODIUM 200 MG/2ML IV SOLN
INTRAVENOUS | Status: DC | PRN
  Administered 2016-04-08: 200 mg via INTRAVENOUS

## 2016-04-08 MED ORDER — BRIMONIDINE TARTRATE 0.2 % OP SOLN
1.0000 [drp] | Freq: Two times a day (BID) | OPHTHALMIC | Status: DC
  Administered 2016-04-09: 1 [drp] via OPHTHALMIC

## 2016-04-08 MED ORDER — MIDAZOLAM HCL 2 MG/2ML IJ SOLN
INTRAMUSCULAR | Status: AC
  Filled 2016-04-08: qty 2

## 2016-04-08 MED ORDER — ATROPINE SULFATE 1 % OP SOLN
OPHTHALMIC | Status: AC
  Filled 2016-04-08: qty 5

## 2016-04-08 MED ORDER — POLYMYXIN B SULFATE 500000 UNITS IJ SOLR
INTRAMUSCULAR | Status: AC
  Filled 2016-04-08: qty 1

## 2016-04-08 MED ORDER — SODIUM CHLORIDE 0.9 % IJ SOLN
INTRAMUSCULAR | Status: AC
  Filled 2016-04-08: qty 10

## 2016-04-08 MED ORDER — HYPROMELLOSE (GONIOSCOPIC) 2.5 % OP SOLN
OPHTHALMIC | Status: AC
  Filled 2016-04-08: qty 15

## 2016-04-08 MED ORDER — PREDNISOLONE ACETATE 1 % OP SUSP
1.0000 [drp] | Freq: Four times a day (QID) | OPHTHALMIC | Status: DC
  Administered 2016-04-09: 1 [drp] via OPHTHALMIC
  Filled 2016-04-08: qty 5

## 2016-04-08 MED ORDER — HYDROMORPHONE HCL 1 MG/ML IJ SOLN
0.2500 mg | INTRAMUSCULAR | Status: DC | PRN
  Administered 2016-04-08 (×2): 0.5 mg via INTRAVENOUS

## 2016-04-08 MED ORDER — ONDANSETRON HCL 4 MG/2ML IJ SOLN
4.0000 mg | Freq: Four times a day (QID) | INTRAMUSCULAR | Status: DC
  Administered 2016-04-08 – 2016-04-09 (×2): 4 mg via INTRAVENOUS
  Filled 2016-04-08 (×2): qty 2

## 2016-04-08 MED ORDER — MEPERIDINE HCL 25 MG/ML IJ SOLN: 6.2500 mg | INTRAMUSCULAR | Status: DC | PRN

## 2016-04-08 MED ORDER — HYDROMORPHONE HCL 1 MG/ML IJ SOLN
INTRAMUSCULAR | Status: AC
  Administered 2016-04-08: 0.5 mg via INTRAVENOUS
  Filled 2016-04-08: qty 1

## 2016-04-08 MED ORDER — BUPIVACAINE HCL (PF) 0.75 % IJ SOLN
INTRAMUSCULAR | Status: DC | PRN
  Administered 2016-04-08: 20 mL

## 2016-04-08 MED ORDER — BACITRACIN-POLYMYXIN B 500-10000 UNIT/GM OP OINT
TOPICAL_OINTMENT | OPHTHALMIC | Status: AC
  Filled 2016-04-08: qty 3.5

## 2016-04-08 SURGICAL SUPPLY — 81 items
APL SRG 3 HI ABS STRL LF PLS (MISCELLANEOUS) ×8
APPLICATOR DR MATTHEWS STRL (MISCELLANEOUS) ×22 IMPLANT
BALL CTTN LRG ABS STRL LF (GAUZE/BANDAGES/DRESSINGS) ×3
BAND CIRCLING RETINAL 2.5 (Ophthalmic Related) IMPLANT
BAND RTNL 125X2.5X.6 CRC (Ophthalmic Related) ×2 IMPLANT
BLADE EYE CATARACT 19 1.4 BEAV (BLADE) ×2 IMPLANT
BLADE MVR KNIFE 19G (BLADE) IMPLANT
CANNULA ANT CHAM MAIN (OPHTHALMIC RELATED) IMPLANT
CANNULA DUAL BORE 23G (CANNULA) IMPLANT
CORDS BIPOLAR (ELECTRODE) IMPLANT
COTTONBALL LRG STERILE PKG (GAUZE/BANDAGES/DRESSINGS) ×9 IMPLANT
COVER MAYO STAND STRL (DRAPES) IMPLANT
COVER SURGICAL LIGHT HANDLE (MISCELLANEOUS) ×3 IMPLANT
DRAPE INCISE 51X51 W/FILM STRL (DRAPES) ×3 IMPLANT
DRAPE OPHTHALMIC 77X100 STRL (CUSTOM PROCEDURE TRAY) ×3 IMPLANT
ERASER HMR WETFIELD 23G BP (MISCELLANEOUS) IMPLANT
FILTER BLUE MILLIPORE (MISCELLANEOUS) ×2 IMPLANT
FILTER STRAW FLUID ASPIR (MISCELLANEOUS) IMPLANT
FORCEPS GRIESHABER ILM 25G A (INSTRUMENTS) IMPLANT
GAS AUTO FILL CONSTEL (OPHTHALMIC)
GAS AUTO FILL CONSTELLATION (OPHTHALMIC) IMPLANT
GLOVE BIO SURGEON STRL SZ8 (GLOVE) ×2 IMPLANT
GLOVE SS BIOGEL STRL SZ 6.5 (GLOVE) ×1 IMPLANT
GLOVE SS BIOGEL STRL SZ 7 (GLOVE) ×1 IMPLANT
GLOVE SUPERSENSE BIOGEL SZ 6.5 (GLOVE) ×2
GLOVE SUPERSENSE BIOGEL SZ 7 (GLOVE) ×2
GLOVE SURG 8.5 LATEX PF (GLOVE) ×3 IMPLANT
GLOVE SURG SS PI 6.5 STRL IVOR (GLOVE) ×2 IMPLANT
GOWN STRL REUS W/ TWL LRG LVL3 (GOWN DISPOSABLE) ×2 IMPLANT
GOWN STRL REUS W/ TWL XL LVL3 (GOWN DISPOSABLE) IMPLANT
GOWN STRL REUS W/TWL LRG LVL3 (GOWN DISPOSABLE) ×9
GOWN STRL REUS W/TWL XL LVL3 (GOWN DISPOSABLE) ×3
HANDLE PNEUMATIC FOR CONSTEL (OPHTHALMIC) IMPLANT
IMPL SILICONE (Ophthalmic Related) ×2 IMPLANT
IMPLANT SILICONE (Ophthalmic Related) ×9 IMPLANT
KIT BASIN OR (CUSTOM PROCEDURE TRAY) ×3 IMPLANT
KIT PERFLUORON PROCEDURE 5ML (MISCELLANEOUS) IMPLANT
KIT ROOM TURNOVER OR (KITS) ×3 IMPLANT
KNIFE CRESCENT 1.75 EDGEAHEAD (BLADE) IMPLANT
KNIFE GRIESHABER SHARP 2.5MM (MISCELLANEOUS) ×9 IMPLANT
MICROPICK 25G (MISCELLANEOUS)
NDL 18GX1X1/2 (RX/OR ONLY) (NEEDLE) ×1 IMPLANT
NDL 25GX 5/8IN NON SAFETY (NEEDLE) IMPLANT
NDL HYPO 30X.5 LL (NEEDLE) IMPLANT
NEEDLE 18GX1X1/2 (RX/OR ONLY) (NEEDLE) ×3 IMPLANT
NEEDLE 25GX 5/8IN NON SAFETY (NEEDLE) IMPLANT
NEEDLE 27GAX1X1/2 (NEEDLE) IMPLANT
NEEDLE HYPO 30X.5 LL (NEEDLE) ×3 IMPLANT
NS IRRIG 1000ML POUR BTL (IV SOLUTION) ×3 IMPLANT
PACK VITRECTOMY CUSTOM (CUSTOM PROCEDURE TRAY) ×3 IMPLANT
PAD ARMBOARD 7.5X6 YLW CONV (MISCELLANEOUS) ×6 IMPLANT
PAK PIK VITRECTOMY CVS 25GA (OPHTHALMIC) IMPLANT
PIC ILLUMINATED 25G (OPHTHALMIC)
PICK MICROPICK 25G (MISCELLANEOUS) IMPLANT
PIK ILLUMINATED 25G (OPHTHALMIC) IMPLANT
PROBE LASER ILLUM FLEX CVD 25G (OPHTHALMIC) IMPLANT
REPL STRA BRUSH NDL (NEEDLE) IMPLANT
REPL STRA BRUSH NEEDLE (NEEDLE) IMPLANT
RESERVOIR BACK FLUSH (MISCELLANEOUS) IMPLANT
ROLLS DENTAL (MISCELLANEOUS) ×6 IMPLANT
SLEEVE SCLERAL TYPE 270 (Ophthalmic Related) ×2 IMPLANT
SPEAR EYE SURG WECK-CEL (MISCELLANEOUS) ×10 IMPLANT
SPONGE SURGIFOAM ABS GEL 12-7 (HEMOSTASIS) IMPLANT
STOPCOCK 4 WAY LG BORE MALE ST (IV SETS) IMPLANT
SUT CHROMIC 7 0 TG140 8 (SUTURE) ×3 IMPLANT
SUT ETHILON 9 0 TG140 8 (SUTURE) IMPLANT
SUT MERSILENE 4 0 RV 2 (SUTURE) ×6 IMPLANT
SUT SILK 2 0 (SUTURE) ×3
SUT SILK 2-0 18XBRD TIE 12 (SUTURE) ×1 IMPLANT
SUT SILK 4 0 RB 1 (SUTURE) ×3 IMPLANT
SYR 20CC LL (SYRINGE) IMPLANT
SYR 50ML LL SCALE MARK (SYRINGE) IMPLANT
SYR 5ML LL (SYRINGE) IMPLANT
SYR BULB 3OZ (MISCELLANEOUS) ×3 IMPLANT
SYR TB 1ML LUER SLIP (SYRINGE) IMPLANT
SYRINGE 10CC LL (SYRINGE) IMPLANT
TIRE RTNL 2.5XGRV CNCV 9X (Ophthalmic Related) IMPLANT
TOWEL OR 17X24 6PK STRL BLUE (TOWEL DISPOSABLE) ×3 IMPLANT
TUBING HIGH PRESS EXTEN 6IN (TUBING) IMPLANT
WATER STERILE IRR 1000ML POUR (IV SOLUTION) ×3 IMPLANT
WIPE INSTRUMENT VISIWIPE 73X73 (MISCELLANEOUS) ×3 IMPLANT

## 2016-04-08 NOTE — Anesthesia Postprocedure Evaluation (Signed)
Anesthesia Post Note  Patient: [name]  Procedure(s) Performed: Procedure(s) (LRB): SCLERAL BUCKLE (Left) GAS/FLUID EXCHANGE (Left) LASER PHOTO ABLATION (Left)  Patient location during evaluation: PACU Anesthesia Type: General Level of consciousness: awake and alert Pain management: pain level controlled Vital Signs Assessment: post-procedure vital signs reviewed and stable Respiratory status: spontaneous breathing, nonlabored ventilation, respiratory function stable and patient connected to nasal cannula oxygen Cardiovascular status: blood pressure returned to baseline and stable Postop Assessment: no signs of nausea or vomiting Anesthetic complications: no    Last Vitals:  Filed Vitals:   04/08/16 1809 04/08/16 1837  BP: 147/84 141/49  Pulse: 83 78  Temp: 36.6 C 36.4 C  Resp: 19 19    Last Pain:  Filed Vitals:   04/08/16 1839  PainSc: Asleep                 Brandon Pratt

## 2016-04-08 NOTE — Brief Op Note (Signed)
Brief Operative note   Preoperative diagnosis:  retinal detachment left eye Postoperative diagnosis  * Rhegmatogenous retinal detachment left eye*  Procedures: Scleral buckle, laser, gas injection C3F8 50%.  Surgeon:  Hayden Pedro, MD...  Assistant:  Deatra Ina SA    Anesthesia: General  Specimen: none  Estimated blood loss:  1cc  Complications: none  Patient sent to PACU in good condition  Composed by Hayden Pedro MD  Dictation number: [phone]

## 2016-04-08 NOTE — Anesthesia Preprocedure Evaluation (Signed)
Anesthesia Evaluation  Patient identified by MRN, date of birth, ID band Patient awake    Reviewed: Allergy & Precautions, NPO status , Patient's Chart, lab work & pertinent test results  History of Anesthesia Complications Negative for: history of anesthetic complications  Airway Mallampati: III  TM Distance: >3 FB Neck ROM: Full    Dental  (+) Edentulous Upper, Edentulous Lower   Pulmonary neg shortness of breath, neg sleep apnea, COPD,  COPD inhaler, neg recent URI, Current Smoker,    Pulmonary exam normal        Cardiovascular hypertension, Pt. on medications + CAD and + CABG  Normal cardiovascular exam     Neuro/Psych PSYCHIATRIC DISORDERS Anxiety Depression negative neurological ROS     GI/Hepatic Neg liver ROS, hiatal hernia, GERD  Medicated and Controlled,  Endo/Other  negative endocrine ROS  Renal/GU negative Renal ROS     Musculoskeletal  (+) Arthritis ,   Abdominal Normal abdominal exam  (+)   Peds  Hematology negative hematology ROS (+)   Anesthesia Other Findings   Reproductive/Obstetrics                             Anesthesia Physical  Anesthesia Plan  ASA: III  Anesthesia Plan: General   Post-op Pain Management:    Induction: Intravenous  Airway Management Planned: Oral ETT  Additional Equipment: None  Intra-op Plan:   Post-operative Plan: Extubation in OR  Informed Consent: I have reviewed the patients History and Physical, chart, labs and discussed the procedure including the risks, benefits and alternatives for the proposed anesthesia with the patient or authorized representative who has indicated his/her understanding and acceptance.     Plan Discussed with: CRNA and Surgeon  Anesthesia Plan Comments:         Anesthesia Quick Evaluation

## 2016-04-08 NOTE — Anesthesia Procedure Notes (Signed)
Procedure Name: Intubation Date/Time: 04/08/2016 1:43 PM Performed by: Jenne Campus Pre-anesthesia Checklist: Patient identified, Emergency Drugs available, Suction available and Patient being monitored Patient Re-evaluated:Patient Re-evaluated prior to inductionOxygen Delivery Method: Circle System Utilized Preoxygenation: Pre-oxygenation with 100% oxygen Intubation Type: IV induction Ventilation: Mask ventilation without difficulty, Oral airway inserted - appropriate to patient size and Two handed mask ventilation required Laryngoscope Size: Miller and 3 Grade View: Grade II Tube type: Oral Tube size: 7.5 mm Number of attempts: 1 Airway Equipment and Method: Stylet and Oral airway Placement Confirmation: ETT inserted through vocal cords under direct vision,  positive ETCO2 and breath sounds checked- equal and bilateral Secured at: 22 cm Tube secured with: Tape Dental Injury: Teeth and Oropharynx as per pre-operative assessment

## 2016-04-08 NOTE — H&P (Signed)
I examined the patient today and there is no change in the medical status 

## 2016-04-08 NOTE — Transfer of Care (Signed)
Immediate Anesthesia Transfer of Care Note  Patient: Brandon Pratt  Procedure(s) Performed: Procedure(s) with comments: SCLERAL BUCKLE (Left) GAS/FLUID EXCHANGE (Left) - C3F8 LASER PHOTO ABLATION (Left) - Headscope laser  Patient Location: PACU  Anesthesia Type:General  Level of Consciousness: awake, oriented and patient cooperative  Airway & Oxygen Therapy: Patient Spontanous Breathing and Patient connected to face mask oxygen  Post-op Assessment: Report given to RN and Post -op Vital signs reviewed and stable  Post vital signs: Reviewed  Last Vitals:  Filed Vitals:   04/08/16 0935 04/08/16 1007  BP:  136/90  Pulse: 58   Temp: 36.8 C   Resp: 18     Last Pain: There were no vitals filed for this visit.    Patients Stated Pain Goal: 5 ([id number] [phone])  Complications: No apparent anesthesia complications

## 2016-04-08 NOTE — Progress Notes (Signed)
Reprt to Nelida Meuse RN as primary caregiver

## 2016-04-09 ENCOUNTER — Encounter (HOSPITAL_COMMUNITY): Payer: Self-pay | Admitting: Ophthalmology

## 2016-04-09 DIAGNOSIS — H33002 Unspecified retinal detachment with retinal break, left eye: Secondary | ICD-10-CM | POA: Diagnosis not present

## 2016-04-09 MED ORDER — PREDNISOLONE ACETATE 1 % OP SUSP: 1.0000 [drp] | Freq: Four times a day (QID) | OPHTHALMIC | Status: DC

## 2016-04-09 MED ORDER — GATIFLOXACIN 0.5 % OP SOLN: 1.0000 [drp] | Freq: Four times a day (QID) | OPHTHALMIC | Status: DC

## 2016-04-09 MED ORDER — BACITRACIN-POLYMYXIN B 500-10000 UNIT/GM OP OINT: 1.0000 "application " | TOPICAL_OINTMENT | Freq: Three times a day (TID) | OPHTHALMIC | Status: DC

## 2016-04-09 MED ORDER — HYDROCODONE-ACETAMINOPHEN 5-325 MG PO TABS: 1.0000 | ORAL_TABLET | ORAL | Status: DC | PRN

## 2016-04-09 MED ORDER — BRIMONIDINE TARTRATE 0.2 % OP SOLN: 1.0000 [drp] | Freq: Two times a day (BID) | OPHTHALMIC | Status: DC

## 2016-04-09 MED ORDER — LISINOPRIL 20 MG PO TABS: 20.0000 mg | ORAL_TABLET | Freq: Every day | ORAL | Status: DC

## 2016-04-09 MED ORDER — LATANOPROST 0.005 % OP SOLN: 1.0000 [drp] | Freq: Every day | OPHTHALMIC | Status: DC

## 2016-04-09 NOTE — Progress Notes (Signed)
Discharge instructions given to patient and wife and verbalized understanding of instructions.

## 2016-04-09 NOTE — Op Note (Signed)
[name], Brandon Pratt NO.:  [id number]  MEDICAL RECORD NO.:  [id number]  LOCATION:  6N07C                        FACILITY:  [facility]  PHYSICIAN:  Chrystie Nose. Zigmund Daniel, M.D. DATE OF BIRTH:  04/24/1956  DATE OF PROCEDURE:  04/08/2016 DATE OF DISCHARGE:  04/09/2016                              OPERATIVE REPORT   ADMISSION DIAGNOSIS:  Rhegmatogenous retinal detachment, left eye.  PROCEDURES:  Scleral buckle, left eye.  Gas fluid exchange, left eye. Retinal photocoagulation, left eye.  SURGEON:  Chrystie Nose. Zigmund Daniel, MD  ASSISTANT:  Deatra Ina, SA.  ANESTHESIA:  General.  DETAILS:  Usual prep and drape, 360 degree limbal peritomy, isolation of 4 rectus muscles on 2-0 silk.  Scleral dissection for 360 degrees to admit a #279 intrascleral implant, 1 mm was trimmed from the posterior edge.  Diathermy was placed in the bed, 2 sutures per quadrant for total of 8 scleral sutures were placed in the scleral flaps.  The 279 implant was placed around the eye with the joint at 8 o'clock.  The 240 band was placed around the eye with a 270 sleeve at 8 o'clock.  Perforation site was chosen at 2 o'clock.  After incision of the sclera and cautery of the choroid, subretinal fluid was drained.  Clear colorless subretinal fluid came forth.  There was a small amount of fluid.  The scleral buckle elements were placed against the globe and the sutures were drawn securely.  The vitreous cavity was entered with a 27-gauge needle and syringe, 1 mL of intravitreal fluid was removed (this patient previously had pars plana vitrectomy, at another surgery).  C3F8 50% 1.5 mL was injected into the vitreous cavity.  Additional fluid was removed from the vitreous cavity to obtain a normal intra-ocular tension.  The scleral buckle was trimmed and adjusted.  Band was tightened and then trimmed and adjusted.  The scleral sutures were knotted and the free ends removed.  Paracentesis x2 obtained a closing  pressure of 10 with a Barraquer tonometer.  The indirect ophthalmoscope laser was moved into place, 1060 burns were placed around the retinal periphery on the scleral buckle.  The power was between [phone] mW, 1000 microns each and 0.1 seconds each.  Indirect ophthalmoscopy showed the retina to be lying nicely in place on the scleral buckle with intravitreal gas present.  The buckle was trimmed.  The band was trimmed.  The conjunctiva was reposited with 7-0 chromic suture.  Polymyxin and ceftazidime were injected around the globe as antibiotics.  Decadron 10 mg was injected into the lower subconjunctival space.  Atropine solution was applied.  Marcaine was injected around the globe for postop pain. The closing pressure was 10 with a Barraquer tonometer.  COMPLICATIONS:  None.  Polysporin ophthalmic ointment and patch and shield were placed.  The patient was awakened, taken to recovery in satisfactory condition.  OPERATIVE TIME:  2.5 hours.     Chrystie Nose. Zigmund Daniel, M.D.     JDM/MEDQ  D:  04/08/2016  T:  04/09/2016  Job:  [id number]

## 2016-04-09 NOTE — Discharge Summary (Signed)
Discharge summary not needed on [facility] patients per medical records. 

## 2016-04-09 NOTE — Progress Notes (Signed)
04/09/2016, 6:37 AM  Mental Status:  Awake, Alert, Oriented  Anterior segment: Cornea  Clear    Anterior Chamber Clear    Lens:   Cataract  Intra Ocular Pressure 25 mmHg with Tonopen  Vitreous: Clear 40%gas bubble   Retina:  Attached Good laser reaction   Impression: Excellent result Retina attached   Final Diagnosis: Principal Problem:   Rhegmatogenous retinal detachment of left eye   Plan: start post operative eye drops.  Add alphagan and xalatan.    Discharge to home.  Give post operative instructions  Hayden Pedro 04/09/2016, 6:37 AM

## 2016-04-11 ENCOUNTER — Encounter (HOSPITAL_BASED_OUTPATIENT_CLINIC_OR_DEPARTMENT_OTHER): Payer: Self-pay | Admitting: Anesthesiology

## 2016-04-11 NOTE — Addendum Note (Signed)
Addendum  created 04/11/16 1731 by Effie Berkshire, MD   Modules edited: Anesthesia Responsible Staff

## 2016-04-15 ENCOUNTER — Encounter (INDEPENDENT_AMBULATORY_CARE_PROVIDER_SITE_OTHER): Payer: Medicare Other | Admitting: Ophthalmology

## 2016-04-15 DIAGNOSIS — H338 Other retinal detachments: Secondary | ICD-10-CM

## 2016-05-07 ENCOUNTER — Encounter (INDEPENDENT_AMBULATORY_CARE_PROVIDER_SITE_OTHER): Payer: Medicare Other | Admitting: Ophthalmology

## 2016-05-07 DIAGNOSIS — H338 Other retinal detachments: Secondary | ICD-10-CM

## 2016-07-16 ENCOUNTER — Encounter (INDEPENDENT_AMBULATORY_CARE_PROVIDER_SITE_OTHER): Payer: Medicare Other | Admitting: Ophthalmology

## 2016-07-16 DIAGNOSIS — H2513 Age-related nuclear cataract, bilateral: Secondary | ICD-10-CM

## 2016-07-16 DIAGNOSIS — H35033 Hypertensive retinopathy, bilateral: Secondary | ICD-10-CM

## 2016-07-16 DIAGNOSIS — H43811 Vitreous degeneration, right eye: Secondary | ICD-10-CM | POA: Diagnosis not present

## 2016-07-16 DIAGNOSIS — H34832 Tributary (branch) retinal vein occlusion, left eye, with macular edema: Secondary | ICD-10-CM

## 2016-07-16 DIAGNOSIS — I10 Essential (primary) hypertension: Secondary | ICD-10-CM | POA: Diagnosis not present

## 2016-07-16 DIAGNOSIS — H34812 Central retinal vein occlusion, left eye, with macular edema: Secondary | ICD-10-CM

## 2016-07-30 ENCOUNTER — Encounter (INDEPENDENT_AMBULATORY_CARE_PROVIDER_SITE_OTHER): Payer: Medicare Other | Admitting: Ophthalmology

## 2016-07-30 DIAGNOSIS — H34812 Central retinal vein occlusion, left eye, with macular edema: Secondary | ICD-10-CM

## 2016-07-30 DIAGNOSIS — H338 Other retinal detachments: Secondary | ICD-10-CM

## 2016-07-30 DIAGNOSIS — H35033 Hypertensive retinopathy, bilateral: Secondary | ICD-10-CM

## 2016-07-30 DIAGNOSIS — H43813 Vitreous degeneration, bilateral: Secondary | ICD-10-CM

## 2016-07-30 DIAGNOSIS — I10 Essential (primary) hypertension: Secondary | ICD-10-CM

## 2016-09-03 ENCOUNTER — Encounter (INDEPENDENT_AMBULATORY_CARE_PROVIDER_SITE_OTHER): Payer: Medicare Other | Admitting: Ophthalmology

## 2016-09-03 DIAGNOSIS — H34812 Central retinal vein occlusion, left eye, with macular edema: Secondary | ICD-10-CM | POA: Diagnosis not present

## 2016-09-03 DIAGNOSIS — H338 Other retinal detachments: Secondary | ICD-10-CM

## 2016-09-03 DIAGNOSIS — H35033 Hypertensive retinopathy, bilateral: Secondary | ICD-10-CM

## 2016-09-03 DIAGNOSIS — H43811 Vitreous degeneration, right eye: Secondary | ICD-10-CM

## 2016-09-03 DIAGNOSIS — I10 Essential (primary) hypertension: Secondary | ICD-10-CM

## 2016-10-15 ENCOUNTER — Encounter (INDEPENDENT_AMBULATORY_CARE_PROVIDER_SITE_OTHER): Payer: Medicare Other | Admitting: Ophthalmology

## 2016-10-15 DIAGNOSIS — I10 Essential (primary) hypertension: Secondary | ICD-10-CM

## 2016-10-15 DIAGNOSIS — H338 Other retinal detachments: Secondary | ICD-10-CM

## 2016-10-15 DIAGNOSIS — H34812 Central retinal vein occlusion, left eye, with macular edema: Secondary | ICD-10-CM | POA: Diagnosis not present

## 2016-10-15 DIAGNOSIS — H35033 Hypertensive retinopathy, bilateral: Secondary | ICD-10-CM

## 2016-10-15 DIAGNOSIS — H43811 Vitreous degeneration, right eye: Secondary | ICD-10-CM

## 2016-11-04 ENCOUNTER — Other Ambulatory Visit: Payer: Self-pay | Admitting: *Deleted

## 2016-11-04 MED ORDER — NITROGLYCERIN 0.4 MG SL SUBL: 0.4000 mg | SUBLINGUAL_TABLET | SUBLINGUAL | 3 refills | Status: DC | PRN

## 2016-12-03 ENCOUNTER — Encounter (INDEPENDENT_AMBULATORY_CARE_PROVIDER_SITE_OTHER): Payer: Medicare Other | Admitting: Ophthalmology

## 2016-12-18 ENCOUNTER — Encounter: Payer: Self-pay | Admitting: *Deleted

## 2016-12-19 ENCOUNTER — Ambulatory Visit: Payer: Medicare Other | Admitting: Cardiology

## 2016-12-19 ENCOUNTER — Ambulatory Visit (INDEPENDENT_AMBULATORY_CARE_PROVIDER_SITE_OTHER): Payer: Medicare Other | Admitting: Cardiology

## 2016-12-19 ENCOUNTER — Encounter: Payer: Self-pay | Admitting: Cardiology

## 2016-12-19 VITALS — BP 137/87 | HR 69 | Ht 70.0 in | Wt 217.2 lb

## 2016-12-19 DIAGNOSIS — Z720 Tobacco use: Secondary | ICD-10-CM | POA: Diagnosis not present

## 2016-12-19 DIAGNOSIS — I25119 Atherosclerotic heart disease of native coronary artery with unspecified angina pectoris: Secondary | ICD-10-CM | POA: Diagnosis not present

## 2016-12-19 DIAGNOSIS — R0989 Other specified symptoms and signs involving the circulatory and respiratory systems: Secondary | ICD-10-CM

## 2016-12-19 DIAGNOSIS — E782 Mixed hyperlipidemia: Secondary | ICD-10-CM

## 2016-12-19 NOTE — Progress Notes (Signed)
Cardiology Office Note  Date: 12/19/2016   ID: Brandon Pratt, DOB 1956-02-09, MRN [id number]  PCP: Deloria Lair, MD  Primary Cardiologist: Rozann Lesches, MD   Chief Complaint  Patient presents with  . Coronary Artery Disease    History of Present Illness: Brandon Pratt is a 61 y.o. male last seen in December 2016. He presents for a routine follow-up visit. Reports stable NYHA class II dyspnea. No exertional chest pain. Sometimes he gets a discomfort on the right side of his neck and jaw, not predictable however.  He continues to smoke cigarettes, has not been able to quit. We have discussed smoking cessation over time.  We went over his current cardiac medications. Regimen includes aspirin, lisinopril, as needed nitroglycerin, and Pravachol. Not on beta blocker currently. I reviewed his ECG from May 2017.  Cardiac catheterization from 2014 is outlined below.  Past Medical History:  Diagnosis Date  . Anxiety   . Arthritis   . Chronic pain syndrome   . Coronary atherosclerosis of native coronary artery    Multivessel, occluded SVG to RCA 2/14 - managed medically  . Depression   . Essential hypertension, benign   . GERD (gastroesophageal reflux disease)   . History of bronchitis 20+ yrs ago  . History of hiatal hernia   . Joint pain   . Mixed hyperlipidemia     Past Surgical History:  Procedure Laterality Date  . APPENDECTOMY    . CARDIAC CATHETERIZATION  2014  . CARPAL TUNNEL RELEASE Left   . CERVICAL SPINE SURGERY     with fusions  . COLONOSCOPY    . CORONARY ARTERY BYPASS GRAFT  12/2009   LIMA to LAD, SVG to OM, SVG to diagonal, SVG to RCA  . ELBOW SURGERY     "moved nerves over"  . GAS/FLUID EXCHANGE Left 03/13/2015   Procedure: GAS/FLUID EXCHANGE;  Surgeon: Hayden Pedro, MD;  Location: [facility];  Service: Ophthalmology;  Laterality: Left;  Marland Kitchen GAS/FLUID EXCHANGE Left 04/08/2016   Procedure: GAS/FLUID EXCHANGE;  Surgeon: Hayden Pedro, MD;  Location: [facility];   Service: Ophthalmology;  Laterality: Left;  C3F8  . HAND SURGERY Right    carpatal tunnel x2  . LASER PHOTO ABLATION Left 03/13/2015   Procedure: LASER PHOTO ABLATION;  Surgeon: Hayden Pedro, MD;  Location: [facility];  Service: Ophthalmology;  Laterality: Left;  Endolaser  . LASER PHOTO ABLATION Left 04/08/2016   Procedure: LASER PHOTO ABLATION;  Surgeon: Hayden Pedro, MD;  Location: [facility];  Service: Ophthalmology;  Laterality: Left;  Headscope laser  . LEFT SHOULDER SURGERY    . MEMBRANE PEEL Left 03/13/2015   Procedure: MEMBRANE PEEL;  Surgeon: Hayden Pedro, MD;  Location: [facility];  Service: Ophthalmology;  Laterality: Left;  . PARS PLANA VITRECTOMY Left 03/13/2015   Procedure: PARS PLANA VITRECTOMY WITH 25 GAUGE;  Surgeon: Hayden Pedro, MD;  Location: [facility];  Service: Ophthalmology;  Laterality: Left;  . RIGHT SHOULDER SURGERY    . SCLERAL BUCKLE Left 04/08/2016   Procedure: SCLERAL BUCKLE;  Surgeon: Hayden Pedro, MD;  Location: [facility];  Service: Ophthalmology;  Laterality: Left;    Current Outpatient Prescriptions  Medication Sig Dispense Refill  . aspirin EC 81 MG tablet Take 1 tablet (81 mg total) by mouth daily.    . bacitracin-polymyxin b (POLYSPORIN) ophthalmic ointment Place 1 application into the left eye 3 (three) times daily. apply to eye every 12 hours while awake 3.5 g  0  . BESIVANCE 0.6 % SUSP Apply 1 drop to eye 3 (three) times daily.    . brimonidine (ALPHAGAN) 0.2 % ophthalmic solution Place 1 drop into the left eye 2 (two) times daily.    . DUREZOL 0.05 % EMUL Place 1 drop into the left eye 3 (three) times daily.    Marland Kitchen latanoprost (XALATAN) 0.005 % ophthalmic solution Place 1 drop into the left eye at bedtime. 2.5 mL 12  . lisinopril (PRINIVIL,ZESTRIL) 20 MG tablet Take 20 mg by mouth every morning.    Marland Kitchen LORazepam (ATIVAN) 1 MG tablet Take 1 mg by mouth 2 (two) times daily as needed for anxiety or sleep.     . Multiple Vitamin (MULTIVITAMIN) tablet Take 1 tablet by  mouth daily.    . nitroGLYCERIN (NITROSTAT) 0.4 MG SL tablet Place 1 tablet (0.4 mg total) under the tongue every 5 (five) minutes as needed for chest pain. 25 tablet 3  . omeprazole (PRILOSEC) 20 MG capsule Take 20 mg by mouth every morning.     . pravastatin (PRAVACHOL) 40 MG tablet Take 40 mg by mouth every evening.     . Probiotic Product (PROBIOTIC-10 PO) Take 1 capsule by mouth daily as needed.    Marland Kitchen PROLENSA 0.07 % SOLN Apply 1 drop to eye daily.    . ranitidine (ZANTAC) 150 MG tablet Take 300 mg by mouth at bedtime.    . prednisoLONE acetate (PRED FORTE) 1 % ophthalmic suspension Place 1 drop into the left eye 4 (four) times daily. 5 mL 0   No current facility-administered medications for this visit.    Allergies:  Codeine   Social History: The patient  reports that he has been smoking Cigarettes.  He started smoking about 56 years ago. He has a 50.00 pack-year smoking history. He quit smokeless tobacco use about 48 years ago. His smokeless tobacco use included Chew. He reports that he drinks alcohol. He reports that he uses drugs, including Marijuana.   ROS:  Please see the history of present illness. Otherwise, complete review of systems is positive for recent eye problems including detached retina and "stroke" in one of his eyes.  All other systems are reviewed and negative.   Physical Exam: VS:  BP 137/87   Pulse 69   Ht 5\' 10"  (1.778 m)   Wt 217 lb 3.2 oz (98.5 kg)   BMI 31.16 kg/m , BMI Body mass index is 31.16 kg/m.  Wt Readings from Last 3 Encounters:  12/19/16 217 lb 3.2 oz (98.5 kg)  04/08/16 210 lb (95.3 kg)  11/30/15 210 lb (95.3 kg)    General: Bearded male, appears comfortable at rest. HEENT: Conjunctiva and lids normal, oropharynx clear. Neck: Supple, no elevated JVP, soft carotid bruits, no thyromegaly. Lungs: Decreased breath sounds without wheezing, nonlabored breathing at rest. Cardiac: Regular rate and rhythm, no S3 or significant systolic murmur, no  pericardial rub. Abdomen: Soft, nontender, bowel sounds present, no guarding or rebound. Extremities: No pitting edema, distal pulses 2+. Skin: Warm and dry. Musculoskeletal: No kyphosis. Neuropsychiatric: Alert and oriented x3, affect grossly appropriate.  ECG: I personally reviewed the tracing from 04/08/2016 which showed sinus bradycardia with right bundle branch block and left anterior fascicular block.  Recent Labwork: 04/08/2016: BUN 6; Creatinine, Ser 0.84; Hemoglobin 16.6; Platelets 218; Potassium 4.1; Sodium 139  June 2016: Hemoglobin 16.4, platelets 219, BUN 8, creatinine 0.83, AST 20, ALT 16, cholesterol 140, HDL 32, LDL 74, triglycerides 170  Other Studies Reviewed Today:  Cardiac catheterization in February 2014 showed native vessel CAD with patent LIMA to LAD, patent SVG to OM, patent SVG to diagonal, and occluded SVG to RCA, with a chronically occluded RCA associated with left to right collaterals.  Assessment and Plan:  1. Multivessel CAD status post CABG with graft disease as outlined above, symptomatically stable in terms of angina control. No increasing nitroglycerin requirement. Continue with observation for now.  2. Ongoing tobacco abuse. We have discussed smoking cessation. He has not been able to quit.  3. Soft carotid bruits, check carotid Dopplers.  4. Hyperlipidemia, on Pravachol, last LDL 74.  Current medicines were reviewed with the patient today.  Disposition: Follow-up in one year.  Signed, Satira Sark, MD, [facility] 12/19/2016 11:43 AM    [facility] at [place], [place], [place] [address] Phone: [phone]; Fax: [phone]

## 2016-12-19 NOTE — Patient Instructions (Signed)
Medication Instructions:  Continue all current medications.  Labwork: none  Testing/Procedures:  Your physician has requested that you have a carotid duplex. This test is an ultrasound of the carotid arteries in your neck. It looks at blood flow through these arteries that supply the brain with blood. Allow one hour for this exam. There are no restrictions or special instructions.  Office will contact with results via phone or letter.    Follow-Up: Your physician wants you to follow up in:  1 year.  You will receive a reminder letter in the mail one-two months in advance.  If you don't receive a letter, please call our office to schedule the follow up appointment   Any Other Special Instructions Will Be Listed Below (If Applicable).  If you need a refill on your cardiac medications before your next appointment, please call your pharmacy.  

## 2017-01-07 ENCOUNTER — Telehealth: Payer: Self-pay | Admitting: *Deleted

## 2017-01-07 ENCOUNTER — Ambulatory Visit: Payer: Medicare Other

## 2017-01-07 DIAGNOSIS — R0989 Other specified symptoms and signs involving the circulatory and respiratory systems: Secondary | ICD-10-CM | POA: Diagnosis not present

## 2017-01-07 DIAGNOSIS — I6523 Occlusion and stenosis of bilateral carotid arteries: Secondary | ICD-10-CM

## 2017-01-07 LAB — VAS US CAROTID
LCCADSYS: -98 cm/s
LCCAPDIAS: 27 cm/s
LEFT ECA DIAS: -28 cm/s
LEFT VERTEBRAL DIAS: -11 cm/s
Left CCA dist dias: -28 cm/s
Left CCA prox sys: 149 cm/s
Left ICA dist dias: -26 cm/s
Left ICA dist sys: -90 cm/s
Left ICA prox dias: -13 cm/s
Left ICA prox sys: -63 cm/s
RCCADSYS: -94 cm/s
RCCAPDIAS: 0 cm/s
RCCAPSYS: 111 cm/s
RIGHT ECA DIAS: -23 cm/s
RIGHT VERTEBRAL DIAS: -11 cm/s

## 2017-01-07 NOTE — Telephone Encounter (Signed)
-----   Message from Satira Sark, MD sent at 01/07/2017  1:47 PM EST ----- Results reviewed. Overall reassuring. 1-39% ICA stenosis, bilaterally. A copy of this test should be forwarded to [facility] B, MD.

## 2017-01-07 NOTE — Telephone Encounter (Signed)
Patient informed and copy sent to PCP. 

## 2017-12-21 NOTE — Progress Notes (Signed)
Cardiology Office Note  Date: 12/23/2017   ID: DALIN CALDERA, DOB 03-Apr-1956, MRN [id number]  PCP: Patient, No Pcp Per  Primary Cardiologist: Rozann Lesches, MD   Chief Complaint  Patient presents with  . Coronary Artery Disease    History of Present Illness: Brandon Pratt is a 62 y.o. male last seen in January 2018. He presents with his wife for a routine follow-up visit. Reports no progressive angina symptoms or interval nitroglycerin use. Remains fairly sedentary, we did discuss a basic exercise plan. States that he is not sleeping well.  I reviewed his medications which are outlined below. Lisinopril dose was recently increased by PCP and Dr. Rayna Sexton office for better control of hypertension. He reports compliance with Pravachol and no obvious intolerances. Last LDL was 70.  I personally reviewed his ECG today which shows normal sinus rhythm with right bundle branch block and left anterior fascicular block (old).  Past Medical History:  Diagnosis Date  . Anxiety   . Arthritis   . Chronic pain syndrome   . Coronary atherosclerosis of native coronary artery    Multivessel, occluded SVG to RCA 2/14 - managed medically  . Depression   . Essential hypertension, benign   . GERD (gastroesophageal reflux disease)   . History of bronchitis 20+ yrs ago  . History of hiatal hernia   . Joint pain   . Mixed hyperlipidemia     Past Surgical History:  Procedure Laterality Date  . APPENDECTOMY    . CARDIAC CATHETERIZATION  2014  . CARPAL TUNNEL RELEASE Left   . CERVICAL SPINE SURGERY     with fusions  . COLONOSCOPY    . CORONARY ARTERY BYPASS GRAFT  12/2009   LIMA to LAD, SVG to OM, SVG to diagonal, SVG to RCA  . ELBOW SURGERY     "moved nerves over"  . GAS/FLUID EXCHANGE Left 03/13/2015   Procedure: GAS/FLUID EXCHANGE;  Surgeon: Hayden Pedro, MD;  Location: [facility];  Service: Ophthalmology;  Laterality: Left;  Marland Kitchen GAS/FLUID EXCHANGE Left 04/08/2016   Procedure: GAS/FLUID  EXCHANGE;  Surgeon: Hayden Pedro, MD;  Location: [facility];  Service: Ophthalmology;  Laterality: Left;  C3F8  . HAND SURGERY Right    carpatal tunnel x2  . LASER PHOTO ABLATION Left 03/13/2015   Procedure: LASER PHOTO ABLATION;  Surgeon: Hayden Pedro, MD;  Location: [facility];  Service: Ophthalmology;  Laterality: Left;  Endolaser  . LASER PHOTO ABLATION Left 04/08/2016   Procedure: LASER PHOTO ABLATION;  Surgeon: Hayden Pedro, MD;  Location: [facility];  Service: Ophthalmology;  Laterality: Left;  Headscope laser  . LEFT SHOULDER SURGERY    . MEMBRANE PEEL Left 03/13/2015   Procedure: MEMBRANE PEEL;  Surgeon: Hayden Pedro, MD;  Location: [facility];  Service: Ophthalmology;  Laterality: Left;  . PARS PLANA VITRECTOMY Left 03/13/2015   Procedure: PARS PLANA VITRECTOMY WITH 25 GAUGE;  Surgeon: Hayden Pedro, MD;  Location: [facility];  Service: Ophthalmology;  Laterality: Left;  . RIGHT SHOULDER SURGERY    . SCLERAL BUCKLE Left 04/08/2016   Procedure: SCLERAL BUCKLE;  Surgeon: Hayden Pedro, MD;  Location: [facility];  Service: Ophthalmology;  Laterality: Left;    Current Outpatient Medications  Medication Sig Dispense Refill  . aspirin EC 81 MG tablet Take 1 tablet (81 mg total) by mouth daily.    Marland Kitchen lisinopril (PRINIVIL,ZESTRIL) 20 MG tablet Take 20 mg by mouth 2 (two) times daily.     Marland Kitchen  LORazepam (ATIVAN) 1 MG tablet Take 1 mg by mouth.     . Multiple Vitamin (MULTIVITAMIN) tablet Take 1 tablet by mouth daily.    . nitroGLYCERIN (NITROSTAT) 0.4 MG SL tablet Place 1 tablet (0.4 mg total) under the tongue every 5 (five) minutes as needed for chest pain. 25 tablet 3  . omeprazole (PRILOSEC) 20 MG capsule Take 20 mg by mouth every morning.     . pravastatin (PRAVACHOL) 40 MG tablet Take 40 mg by mouth every evening.     . Probiotic Product (PROBIOTIC-10 PO) Take 1 capsule by mouth daily as needed.    . ranitidine (ZANTAC) 150 MG tablet Take 300 mg by mouth at bedtime.     No current facility-administered  medications for this visit.    Allergies:  Codeine   Social History: The patient  reports that he has been smoking cigarettes.  He started smoking about 57 years ago. He has a 50.00 pack-year smoking history. He quit smokeless tobacco use about 49 years ago. His smokeless tobacco use included chew. He reports that he drinks alcohol. He reports that he uses drugs. Drug: Marijuana.   ROS:  Please see the history of present illness. Otherwise, complete review of systems is positive for arthritic pains, insomnia, intermittent lightheadedness without syncope.  All other systems are reviewed and negative.   Physical Exam: VS:  BP (!) 144/88   Pulse 77   Ht 5\' 10"  (1.778 m)   Wt 207 lb (93.9 kg)   SpO2 98%   BMI 29.70 kg/m , BMI Body mass index is 29.7 kg/m.  Wt Readings from Last 3 Encounters:  12/23/17 207 lb (93.9 kg)  12/19/16 217 lb 3.2 oz (98.5 kg)  04/08/16 210 lb (95.3 kg)    General: Chronically ill-appearing male, appears comfortable at rest. HEENT: Conjunctiva and lids normal, oropharynx clear. Neck: Supple, no elevated JVP, soft carotid bruits, no thyromegaly. Lungs: Decreased breath sounds, nonlabored breathing at rest. Cardiac: Regular rate and rhythm, no S3 or significant systolic murmur, no pericardial rub. Abdomen: Soft, nontender, bowel sounds present. Extremities: No pitting edema, distal pulses 2+. Skin: Warm and dry. Musculoskeletal: No kyphosis. Neuropsychiatric: Alert and oriented x3, affect grossly appropriate.  ECG: I personally reviewed the tracing from 04/08/2016 which showed sinus arrhythmia with right bundle branch block and left anterior fascicular block.  Recent Labwork:  July 2018 hemoglobin 17.1, platelets 227, BUN 9, creatinine 0.82, AST 19, ALT 18, potassium 4.7, triglycerides 87, cholesterol 128, HDL 41, LDL 70  Other Studies Reviewed Today:  Carotid Dopplers 01/07/2017: 1-39% bilateral ICA stenoses.  Cardiac catheterization February 2014: Native  vessel CAD with patent LIMA to LAD, patent SVG to OM, patent SVG to diagonal, and occluded SVG to RCA, with a chronically occluded RCA associated with left to right collaterals.  Assessment and Plan:  1. Multivessel CAD status post CABG with known graft disease but stable angina symptoms on medical therapy. ECG reviewed today. Will continue with observation. Did recommend a basic walking regimen.  2. Ongoing tobacco abuse. We have discussed smoking cessation but he has not been motivated to quit.  3. Mixed hyperlipidemia, continues on Pravachol, last LDL 70.  4. Mild carotid atherosclerosis by Dopplers last year. Continue aspirin and statin.  Current medicines were reviewed with the patient today.   Orders Placed This Encounter  Procedures  . EKG 12-Lead    Disposition: Follow-up in one year.  Signed, Satira Sark, MD, [facility] 12/23/2017 11:32 AM    [facility] at [place], [place], [place] [address] Phone: [phone]; Fax: [phone]

## 2017-12-23 ENCOUNTER — Encounter: Payer: Self-pay | Admitting: Cardiology

## 2017-12-23 ENCOUNTER — Ambulatory Visit: Payer: Medicare Other | Admitting: Cardiology

## 2017-12-23 VITALS — BP 144/88 | HR 77 | Ht 70.0 in | Wt 207.0 lb

## 2017-12-23 DIAGNOSIS — E782 Mixed hyperlipidemia: Secondary | ICD-10-CM

## 2017-12-23 DIAGNOSIS — Z720 Tobacco use: Secondary | ICD-10-CM

## 2017-12-23 DIAGNOSIS — I6523 Occlusion and stenosis of bilateral carotid arteries: Secondary | ICD-10-CM

## 2017-12-23 DIAGNOSIS — I25119 Atherosclerotic heart disease of native coronary artery with unspecified angina pectoris: Secondary | ICD-10-CM

## 2017-12-23 NOTE — Patient Instructions (Signed)

## 2018-02-24 ENCOUNTER — Other Ambulatory Visit: Payer: Self-pay

## 2018-02-24 ENCOUNTER — Encounter (HOSPITAL_COMMUNITY): Payer: Self-pay | Admitting: Emergency Medicine

## 2018-02-24 ENCOUNTER — Emergency Department (HOSPITAL_COMMUNITY): Payer: Medicare Other

## 2018-02-24 ENCOUNTER — Emergency Department (HOSPITAL_COMMUNITY)
Admission: EM | Admit: 2018-02-24 | Discharge: 2018-02-24 | Disposition: A | Discharge status: Home / Self Care | Payer: Medicare Other | Attending: Emergency Medicine | Admitting: Emergency Medicine

## 2018-02-24 DIAGNOSIS — G44209 Tension-type headache, unspecified, not intractable: Secondary | ICD-10-CM | POA: Insufficient documentation

## 2018-02-24 DIAGNOSIS — D72829 Elevated white blood cell count, unspecified: Secondary | ICD-10-CM | POA: Insufficient documentation

## 2018-02-24 DIAGNOSIS — I2510 Atherosclerotic heart disease of native coronary artery without angina pectoris: Secondary | ICD-10-CM | POA: Insufficient documentation

## 2018-02-24 DIAGNOSIS — J029 Acute pharyngitis, unspecified: Secondary | ICD-10-CM | POA: Insufficient documentation

## 2018-02-24 DIAGNOSIS — I10 Essential (primary) hypertension: Secondary | ICD-10-CM | POA: Insufficient documentation

## 2018-02-24 DIAGNOSIS — M542 Cervicalgia: Secondary | ICD-10-CM | POA: Insufficient documentation

## 2018-02-24 DIAGNOSIS — F17210 Nicotine dependence, cigarettes, uncomplicated: Secondary | ICD-10-CM | POA: Diagnosis not present

## 2018-02-24 DIAGNOSIS — E876 Hypokalemia: Secondary | ICD-10-CM | POA: Insufficient documentation

## 2018-02-24 DIAGNOSIS — Z951 Presence of aortocoronary bypass graft: Secondary | ICD-10-CM | POA: Diagnosis not present

## 2018-02-24 DIAGNOSIS — Z981 Arthrodesis status: Secondary | ICD-10-CM | POA: Insufficient documentation

## 2018-02-24 LAB — COMPREHENSIVE METABOLIC PANEL
ALBUMIN: 4 g/dL (ref 3.5–5.0)
ALK PHOS: 61 U/L (ref 38–126)
ALT: 16 U/L — ABNORMAL LOW (ref 17–63)
AST: 16 U/L (ref 15–41)
Anion gap: 11 (ref 5–15)
BILIRUBIN TOTAL: 1.2 mg/dL (ref 0.3–1.2)
BUN: 8 mg/dL (ref 6–20)
CALCIUM: 9.1 mg/dL (ref 8.9–10.3)
CO2: 29 mmol/L (ref 22–32)
Chloride: 96 mmol/L — ABNORMAL LOW (ref 101–111)
Creatinine, Ser: 0.77 mg/dL (ref 0.61–1.24)
GFR calc Af Amer: >60 mL/min (ref >60)
GFR calc non Af Amer: >60 mL/min (ref >60)
GLUCOSE: 111 mg/dL — AB (ref 65–99)
POTASSIUM: 3.4 mmol/L — AB (ref 3.5–5.1)
SODIUM: 136 mmol/L (ref 135–145)
TOTAL PROTEIN: 7.7 g/dL (ref 6.5–8.1)

## 2018-02-24 LAB — CBC WITH DIFFERENTIAL/PLATELET
BASOS ABS: 0 10*3/uL (ref 0.0–0.1)
Basophils Relative: 0 %
EOS PCT: 1 %
Eosinophils Absolute: 0.1 10*3/uL (ref 0.0–0.7)
HEMATOCRIT: 47.3 % (ref 39.0–52.0)
Hemoglobin: 16 g/dL (ref 13.0–17.0)
LYMPHS PCT: 19 %
Lymphs Abs: 2.6 10*3/uL (ref 0.7–4.0)
MCH: 32.8 pg (ref 26.0–34.0)
MCHC: 33.8 g/dL (ref 30.0–36.0)
MCV: 96.9 fL (ref 78.0–100.0)
MONO ABS: 1.6 10*3/uL — AB (ref 0.1–1.0)
MONOS PCT: 12 %
Neutro Abs: 9.2 10*3/uL — ABNORMAL HIGH (ref 1.7–7.7)
Neutrophils Relative %: 68 %
PLATELETS: 204 10*3/uL (ref 150–400)
RBC: 4.88 MIL/uL (ref 4.22–5.81)
RDW: 12.9 % (ref 11.5–15.5)
WBC: 13.6 10*3/uL — ABNORMAL HIGH (ref 4.0–10.5)

## 2018-02-24 MED ORDER — IOPAMIDOL (ISOVUE-300) INJECTION 61%
75.0000 mL | Freq: Once | INTRAVENOUS | Status: AC | PRN
  Administered 2018-02-24: 75 mL via INTRAVENOUS

## 2018-02-24 MED ORDER — TRAMADOL HCL 50 MG PO TABS: 50.0000 mg | ORAL_TABLET | Freq: Four times a day (QID) | ORAL | 0 refills | Status: DC | PRN

## 2018-02-24 MED ORDER — METOCLOPRAMIDE HCL 5 MG/ML IJ SOLN
10.0000 mg | Freq: Once | INTRAMUSCULAR | Status: AC
  Administered 2018-02-24: 10 mg via INTRAVENOUS
  Filled 2018-02-24: qty 2

## 2018-02-24 MED ORDER — SODIUM CHLORIDE 0.9 % IV BOLUS
500.0000 mL | Freq: Once | INTRAVENOUS | Status: AC
  Administered 2018-02-24: 500 mL via INTRAVENOUS

## 2018-02-24 MED ORDER — KETOROLAC TROMETHAMINE 30 MG/ML IJ SOLN
30.0000 mg | Freq: Once | INTRAMUSCULAR | Status: AC
  Administered 2018-02-24: 30 mg via INTRAVENOUS
  Filled 2018-02-24: qty 1

## 2018-02-24 MED ORDER — SUCRALFATE 1 GM/10ML PO SUSP: 1.0000 g | Freq: Three times a day (TID) | ORAL | 0 refills | Status: DC

## 2018-02-24 NOTE — ED Provider Notes (Signed)
Emergency Department Provider Note   I have reviewed the triage vital signs and the nursing notes.   HISTORY  Chief Complaint Neck Pain   HPI Brandon Pratt is a 62 y.o. male with PMH of chronic intermittent neck pain, HTN, GERD, CAD, and HLD with tobacco use history and history of prior cervical spine fusion presents to the emergency department for evaluation of acute on chronic neck pain for the past 3 days with associated headache.  The patient denies any fever.  His wife states he has had T-max 83 F at home.  Denies any body aches or chills.  He has pain mainly in the lateral neck that radiates to the shoulder blades.  No numbness or tingling.  Denies any pain down the center of the neck or back.  No vomiting or diarrhea.  He has been trying various muscle rubs and over-the-counter medications with no relief in symptoms.  Denies any new neck trauma.  He states he has had similar neck pain since 1986 after he injured his neck.  He states that this pain is very similar to the chronic pain but has not gone away which is unusual.    Past Medical History:  Diagnosis Date  . Anxiety   . Arthritis   . Chronic pain syndrome   . Coronary atherosclerosis of native coronary artery    Multivessel, occluded SVG to RCA 2/14 - managed medically  . Depression   . Essential hypertension, benign   . GERD (gastroesophageal reflux disease)   . History of bronchitis 20+ yrs ago  . History of hiatal hernia   . Joint pain   . Mixed hyperlipidemia     Patient Active Problem List   Diagnosis Date Noted  . 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 Vitreous hemorrhage, left eye ([place]) 03/13/2015  . Epiretinal membrane, left eye 03/13/2015  . Coronary atherosclerosis of native coronary artery 01/07/2013  . TOBACCO ABUSE 10/15/2009  . HYPERLIPIDEMIA 10/11/2009  . CHRONIC PAIN SYNDROME 10/11/2009  . Essential hypertension, benign 10/11/2009    Past Surgical History:  Procedure  Laterality Date  . APPENDECTOMY    . CARDIAC CATHETERIZATION  2014  . CARPAL TUNNEL RELEASE Left   . CERVICAL SPINE SURGERY     with fusions  . COLONOSCOPY    . CORONARY ARTERY BYPASS GRAFT  12/2009   LIMA to LAD, SVG to OM, SVG to diagonal, SVG to RCA  . ELBOW SURGERY     "moved nerves over"  . GAS/FLUID EXCHANGE Left 03/13/2015   Procedure: GAS/FLUID EXCHANGE;  Surgeon: Hayden Pedro, MD;  Location: [facility];  Service: Ophthalmology;  Laterality: Left;  Marland Kitchen GAS/FLUID EXCHANGE Left 04/08/2016   Procedure: GAS/FLUID EXCHANGE;  Surgeon: Hayden Pedro, MD;  Location: [place];  Service: Ophthalmology;  Laterality: Left;  C3F8  . HAND SURGERY Right    carpatal tunnel x2  . LASER PHOTO ABLATION Left 03/13/2015   Procedure: LASER PHOTO ABLATION;  Surgeon: Hayden Pedro, MD;  Location: [place];  Service: Ophthalmology;  Laterality: Left;  Endolaser  . LASER PHOTO ABLATION Left 04/08/2016   Procedure: LASER PHOTO ABLATION;  Surgeon: Hayden Pedro, MD;  Location: [facility];  Service: Ophthalmology;  Laterality: Left;  Headscope laser  . LEFT SHOULDER SURGERY    . MEMBRANE PEEL Left 03/13/2015   Procedure: MEMBRANE PEEL;  Surgeon: Hayden Pedro, MD;  Location: [place];  Service: Ophthalmology;  Laterality: Left;  . PARS PLANA VITRECTOMY  Left 03/13/2015   Procedure: PARS PLANA VITRECTOMY WITH 25 GAUGE;  Surgeon: Hayden Pedro, MD;  Location: [place];  Service: Ophthalmology;  Laterality: Left;  . RIGHT SHOULDER SURGERY    . SCLERAL BUCKLE Left 04/08/2016   Procedure: SCLERAL BUCKLE;  Surgeon: Hayden Pedro, MD;  Location: [place];  Service: Ophthalmology;  Laterality: Left;    Current Outpatient Rx  . Order #: [id number] Class: No Print  . Order #: [id number] Class: Historical Med  . Order #: [id number] Class: Historical Med  . Order #: [id number] Class: Historical Med  . Order #: [id number] Class: Normal  . Order #: [id number] Class: Historical Med  . Order #: [id number] Class: Historical Med  . Order #: [id number] Class:  Historical Med  . Order #: [id number] Class: Historical Med  . Order #: [id number] Class: Print  . Order #: [id number] Class: Print    Allergies Codeine  Family History  Problem Relation Age of Onset  . Hypertension Mother   . Diabetes Mother   . Other Mother        Pacemaker  . Stroke Father     Social History Social History   Tobacco Use  . Smoking status: Current Every Day Smoker    Packs/day: 1.00    Years: 50.00    Pack years: 50.00    Types: Cigarettes    Start date: 05/13/1960  . Smokeless tobacco: Former Systems developer    Types: Chew    Quit date: 12/01/1968  . Tobacco comment: 2-3 packs per day depending on stress with wife (bipolar)  Substance Use Topics  . Alcohol use: Yes    Comment: rare  . Drug use: Yes    Types: Marijuana    Comment: Smokes POT occasionally to help with pain. last time few days ago    Review of Systems  Constitutional: No fever/chills Eyes: No visual changes. ENT: No sore throat. Cardiovascular: Denies chest pain. Respiratory: Denies shortness of breath. Gastrointestinal: No abdominal pain.  No nausea, no vomiting.  No diarrhea.  No constipation. Genitourinary: Negative for dysuria. Musculoskeletal: Negative for back pain. Positive lateral neck pain radiating to the bilateral shoulders.  Skin: Negative for rash. Neurological: Negative for focal weakness or numbness. Positive HA.   10-point ROS otherwise negative.  ____________________________________________   PHYSICAL EXAM:  VITAL SIGNS: ED Triage Vitals  Enc Vitals Group     BP 02/24/18 1249 (!) 173/100     Pulse Rate 02/24/18 1249 82     Resp 02/24/18 1249 18     Temp 02/24/18 1249 97.6 F (36.4 C)     Temp Source 02/24/18 1249 Oral     SpO2 02/24/18 1249 97 %     Weight 02/24/18 1248 206 lb (93.4 kg)     Height 02/24/18 1248 5\' 10"  (1.778 m)     Pain Score 02/24/18 1248 8    Constitutional: Alert and oriented. Well appearing and in no acute distress. Eyes: Conjunctivae are  normal.  Head: Atraumatic. Ears:  Healthy appearing ear canals and TMs bilaterally Nose: Mild congestion/rhinnorhea. Mouth/Throat: Mucous membranes are slightly dry.  Oropharynx non-erythematous. Some trismus.  Neck: No stridor.  No meningeal signs. No cervical spine tenderness to palpation. Decreased ROM of the neck with tenderness to palpation laterally.  Cardiovascular: Normal rate, regular rhythm. Good peripheral circulation. Grossly normal heart sounds.   Respiratory: Normal respiratory effort.  No retractions. Lungs CTAB. Gastrointestinal: Soft and nontender. No distention.  Musculoskeletal: No lower extremity tenderness nor edema. No gross deformities of extremities. Neurologic:  Normal speech and language.  No gross focal neurologic deficits are appreciated.  Skin:  Skin is warm, dry and intact. No rash noted.  ____________________________________________   LABS (all labs ordered are listed, but only abnormal results are displayed)  Labs Reviewed  COMPREHENSIVE METABOLIC PANEL - Abnormal; Notable for the following components:      Result Value   Potassium 3.4 (*)    Chloride 96 (*)    Glucose, Bld 111 (*)    ALT 16 (*)    All other components within normal limits  CBC WITH DIFFERENTIAL/PLATELET - Abnormal; Notable for the following components:   WBC 13.6 (*)    Neutro Abs 9.2 (*)    Monocytes Absolute 1.6 (*)    All other components within normal limits   ____________________________________________  RADIOLOGY  Ct Soft Tissue Neck W Contrast  Result Date: 02/24/2018 CLINICAL DATA:  Neck pain for 3 days with difficulty swallowing. EXAM: CT NECK WITH CONTRAST TECHNIQUE: Multidetector CT imaging of the neck was performed using the standard protocol following the bolus administration of intravenous contrast. CONTRAST:  66mL ISOVUE-300 IOPAMIDOL (ISOVUE-300) INJECTION 61% COMPARISON:  None. FINDINGS: There is moderate swallowing artifact throughout the neck, limiting  assessment of the pharyngeal regions. Small or subtle lesions could be overlooked. Pharynx and larynx: Grossly normal.  No mass or swelling. Salivary glands: No inflammation, mass, or stone. Thyroid: Normal. Lymph nodes: None enlarged or abnormal density. Vascular: Atherosclerosis at the carotid bifurcations. Limited intracranial: Negative. Visualized orbits: Not visualized. Mastoids and visualized paranasal sinuses: Retention cyst RIGHT maxillary sinus. Remaining visualized sinuses are clear. No mastoid fluid. Skeleton: Previous C4-C7 ACDF. Anterior plating at C6 and C7. Adjacent segment disease at C3-4 with osseous spurring. Upper chest: No pneumothorax or mass. Prior CABG. Thick-walled esophagus, in the upper to mid chest, without layering fluid. Query esophagitis. Other: None. IMPRESSION: Moderate swallowing artifact through the neck, limiting assessment. Grossly normal pharyngeal regions, without visible mass or fluid collection. Status post C4-C7 ACDF. Adjacent segment disease C3-4 with osseous spurring. Thick-walled esophagus, without layering fluid.  Query esophagitis. Electronically Signed   By: Staci Righter M.D.   On: 02/24/2018 16:18    ____________________________________________   PROCEDURES  Procedure(s) performed:   Procedures  None ____________________________________________   INITIAL IMPRESSION / ASSESSMENT AND PLAN / ED COURSE  Pertinent labs & imaging results that were available during my care of the patient were reviewed by me and considered in my medical decision making (see chart for details).  Patient presents to the emergency department for evaluation of neck pain with headache.  He has been afebrile.  Symptoms been ongoing for the past 3 days.  He reports that his neck discomfort is lateral and feels like pain he has had frequently in the past stemming from an injury in 1986. No neuro deficits on exam. My clinical suspicion for bacterial meningitis in this case is  extremely low.  The patient does have some neck stiffness on exam but this seems mainly lateral and typical of his more chronic neck pain. Some trismus on exam and sore throat.  Given the patient's smoking history and and neck exam plan for CT imaging of the neck with contrast.  Plan to also obtain labs, give IV fluids, treat headache, and reassess.  02:15 PM Patient's HA has resolved with Toradol and IVF. Neck pain significantly improved. CBC with mild leukocytosis and CMP with mild hypokalemia but no other acute findings. CT neck pending.   04:30 PM CT neck negative for acute process. Some possible esophagitis  noted. No clear bacterial infection source. Remains afebrile. HA gone and neck pain improved. Plan for supportive care at home and PCP follow up in the coming week. Discussed ED return precautions in detail with the patient and wife at bedside.   At this time, I do not feel there is any life-threatening condition present. I have reviewed and discussed all results (EKG, imaging, lab, urine as appropriate), exam findings with patient. I have reviewed nursing notes and appropriate previous records.  I feel the patient is safe to be discharged home without further emergent workup. Discussed usual and customary return precautions. Patient and family (if present) verbalize understanding and are comfortable with this plan.  Patient will follow-up with their primary care provider. If they do not have a primary care provider, information for follow-up has been provided to them. All questions have been answered.  ____________________________________________  FINAL CLINICAL IMPRESSION(S) / ED DIAGNOSES  Final diagnoses:  Neck pain  Sore throat  Acute non intractable tension-type headache     MEDICATIONS GIVEN DURING THIS VISIT:  Medications  ketorolac (TORADOL) 30 MG/ML injection 30 mg (30 mg Intravenous Given 02/24/18 1333)  sodium chloride 0.9 % bolus 500 mL (0 mLs Intravenous Stopped 02/24/18  1438)  metoCLOPramide (REGLAN) injection 10 mg (10 mg Intravenous Given 02/24/18 1335)  iopamidol (ISOVUE-300) 61 % injection 75 mL (75 mLs Intravenous Contrast Given 02/24/18 1555)     NEW OUTPATIENT MEDICATIONS STARTED DURING THIS VISIT:  New Prescriptions   SUCRALFATE (CARAFATE) 1 GM/10ML SUSPENSION    Take 10 mLs (1 g total) by mouth 4 (four) times daily -  with meals and at bedtime.   TRAMADOL (ULTRAM) 50 MG TABLET    Take 1 tablet (50 mg total) by mouth every 6 (six) hours as needed.    Note:  This document was prepared using Dragon voice recognition software and may include unintentional dictation errors.  Nanda Quinton, MD Emergency Medicine    Adalaya Irion, [facility], MD 02/24/18 1640

## 2018-02-24 NOTE — Discharge Instructions (Signed)

## 2018-02-24 NOTE — ED Triage Notes (Signed)
Pt states started with neck pain 3 days ago. Now having trouble swallowing, and neck pain.

## 2018-04-05 DIAGNOSIS — E782 Mixed hyperlipidemia: Secondary | ICD-10-CM | POA: Diagnosis not present

## 2018-04-07 DIAGNOSIS — Z0000 Encounter for general adult medical examination without abnormal findings: Secondary | ICD-10-CM | POA: Diagnosis not present

## 2018-04-07 DIAGNOSIS — I2510 Atherosclerotic heart disease of native coronary artery without angina pectoris: Secondary | ICD-10-CM | POA: Diagnosis not present

## 2018-04-07 DIAGNOSIS — K219 Gastro-esophageal reflux disease without esophagitis: Secondary | ICD-10-CM | POA: Diagnosis not present

## 2018-04-07 DIAGNOSIS — E782 Mixed hyperlipidemia: Secondary | ICD-10-CM | POA: Diagnosis not present

## 2018-04-07 DIAGNOSIS — I10 Essential (primary) hypertension: Secondary | ICD-10-CM | POA: Diagnosis not present

## 2018-04-27 ENCOUNTER — Other Ambulatory Visit (HOSPITAL_COMMUNITY): Payer: Self-pay | Admitting: Respiratory Therapy

## 2018-04-27 DIAGNOSIS — F17200 Nicotine dependence, unspecified, uncomplicated: Secondary | ICD-10-CM

## 2018-05-04 ENCOUNTER — Ambulatory Visit (HOSPITAL_COMMUNITY)
Admission: RE | Admit: 2018-05-04 | Discharge: 2018-05-04 | Disposition: A | Discharge status: Home / Self Care | Payer: Medicare Other | Source: Ambulatory Visit | Attending: Internal Medicine | Admitting: Internal Medicine

## 2018-05-04 DIAGNOSIS — R0609 Other forms of dyspnea: Secondary | ICD-10-CM | POA: Diagnosis not present

## 2018-05-04 DIAGNOSIS — F17200 Nicotine dependence, unspecified, uncomplicated: Secondary | ICD-10-CM | POA: Diagnosis present

## 2018-05-04 DIAGNOSIS — F17210 Nicotine dependence, cigarettes, uncomplicated: Secondary | ICD-10-CM | POA: Diagnosis not present

## 2018-05-04 DIAGNOSIS — R918 Other nonspecific abnormal finding of lung field: Secondary | ICD-10-CM | POA: Diagnosis not present

## 2018-05-04 DIAGNOSIS — R05 Cough: Secondary | ICD-10-CM | POA: Diagnosis not present

## 2018-05-04 LAB — PULMONARY FUNCTION TEST
DL/VA % pred: 76 %
DL/VA: 3.52 ml/min/mmHg/L
DLCO UNC % PRED: 47 %
DLCO UNC: 15.21 ml/min/mmHg
FEF 25-75 PRE: 0.68 L/s
FEF2575-%Pred-Pre: 24 %
FEV1-%Pred-Pre: 40 %
FEV1-PRE: 1.42 L
FEV1FVC-%Pred-Pre: 81 %
FEV6-%Pred-Pre: 51 %
FEV6-Pre: 2.28 L
FEV6FVC-%PRED-PRE: 103 %
FVC-%Pred-Pre: 49 %
FVC-Pre: 2.33 L
PRE FEV1/FVC RATIO: 61 %
PRE FEV6/FVC RATIO: 98 %
RV % pred: 182 %
RV: 4.2 L
TLC % pred: 93 %
TLC: 6.51 L

## 2018-05-06 DIAGNOSIS — H35031 Hypertensive retinopathy, right eye: Secondary | ICD-10-CM | POA: Diagnosis not present

## 2018-06-30 DIAGNOSIS — J449 Chronic obstructive pulmonary disease, unspecified: Secondary | ICD-10-CM | POA: Diagnosis not present

## 2018-06-30 DIAGNOSIS — M1990 Unspecified osteoarthritis, unspecified site: Secondary | ICD-10-CM | POA: Diagnosis not present

## 2018-06-30 DIAGNOSIS — G894 Chronic pain syndrome: Secondary | ICD-10-CM | POA: Diagnosis not present

## 2018-10-11 DIAGNOSIS — I10 Essential (primary) hypertension: Secondary | ICD-10-CM | POA: Diagnosis not present

## 2018-10-11 DIAGNOSIS — E782 Mixed hyperlipidemia: Secondary | ICD-10-CM | POA: Diagnosis not present

## 2018-10-13 DIAGNOSIS — Z0000 Encounter for general adult medical examination without abnormal findings: Secondary | ICD-10-CM | POA: Diagnosis not present

## 2018-10-13 DIAGNOSIS — Z23 Encounter for immunization: Secondary | ICD-10-CM | POA: Diagnosis not present

## 2018-10-13 DIAGNOSIS — I2510 Atherosclerotic heart disease of native coronary artery without angina pectoris: Secondary | ICD-10-CM | POA: Diagnosis not present

## 2018-10-13 DIAGNOSIS — E782 Mixed hyperlipidemia: Secondary | ICD-10-CM | POA: Diagnosis not present

## 2018-10-13 DIAGNOSIS — I10 Essential (primary) hypertension: Secondary | ICD-10-CM | POA: Diagnosis not present

## 2018-10-14 DIAGNOSIS — J449 Chronic obstructive pulmonary disease, unspecified: Secondary | ICD-10-CM | POA: Diagnosis not present

## 2018-11-13 DIAGNOSIS — J449 Chronic obstructive pulmonary disease, unspecified: Secondary | ICD-10-CM | POA: Diagnosis not present

## 2018-12-14 DIAGNOSIS — J449 Chronic obstructive pulmonary disease, unspecified: Secondary | ICD-10-CM | POA: Diagnosis not present

## 2018-12-27 NOTE — Progress Notes (Signed)
Cardiology Office Note  Date: 12/29/2018   ID: Brandon Pratt, DOB [birth year], MRN [id number]  PCP: Celene Squibb, MD  Primary Cardiologist: Rozann Lesches, MD   Chief Complaint  Patient presents with  . Coronary Artery Disease    History of Present Illness: Brandon Pratt is a 63 y.o. male last seen in January 2019.  He is here for a routine visit.  He does not report any angina symptoms or nitroglycerin use.  No regular exercise plan, relatively sedentary other than basic ADLs.  I have talked with him about a walking regimen.  He and his wife tried to quit smoking for about 10 days earlier this month, ultimately resumed.  They did not try any pharmacologic measures, we discussed these today.  He states that he may try again in the next few months.  Reviewed his medications which are outlined below.  He reports no changes, now following with Dr. Nevada Crane for primary care.  We are requesting most recent lab work.  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  Past Medical History:  Diagnosis Date  . Anxiety   . Arthritis   . Chronic pain syndrome   . Coronary atherosclerosis of native coronary artery    Multivessel, occluded SVG to RCA 2/14 - managed medically  . Depression   . Essential hypertension, benign   . GERD (gastroesophageal reflux disease)   . History of bronchitis 20+ yrs ago  . History of hiatal hernia   . Joint pain   . Mixed hyperlipidemia     Past Surgical History:  Procedure Laterality Date  . APPENDECTOMY    . CARDIAC CATHETERIZATION  2014  . CARPAL TUNNEL RELEASE Left   . CERVICAL SPINE SURGERY     with fusions  . COLONOSCOPY    . CORONARY ARTERY BYPASS GRAFT  12/2009   LIMA to LAD, SVG to OM, SVG to diagonal, SVG to RCA  . ELBOW SURGERY     "moved nerves over"  . GAS/FLUID EXCHANGE Left 03/13/2015   Procedure: GAS/FLUID EXCHANGE;   Surgeon: Hayden Pedro, MD;  Location: [facility];  Service: Ophthalmology;  Laterality: Left;  Marland Kitchen GAS/FLUID EXCHANGE Left 04/08/2016   Procedure: GAS/FLUID EXCHANGE;  Surgeon: Hayden Pedro, MD;  Location: [place];  Service: Ophthalmology;  Laterality: Left;  C3F8  . HAND SURGERY Right    carpatal tunnel x2  . LASER PHOTO ABLATION Left 03/13/2015   Procedure: LASER PHOTO ABLATION;  Surgeon: Hayden Pedro, MD;  Location: [place];  Service: Ophthalmology;  Laterality: Left;  Endolaser  . LASER PHOTO ABLATION Left 04/08/2016   Procedure: LASER PHOTO ABLATION;  Surgeon: Hayden Pedro, MD;  Location: [facility];  Service: Ophthalmology;  Laterality: Left;  Headscope laser  . LEFT SHOULDER SURGERY    . MEMBRANE PEEL Left 03/13/2015   Procedure: MEMBRANE PEEL;  Surgeon: Hayden Pedro, MD;  Location: [facility];  Service: Ophthalmology;  Laterality: Left;  . PARS PLANA VITRECTOMY Left 03/13/2015   Procedure: PARS PLANA VITRECTOMY WITH 25 GAUGE;  Surgeon: Hayden Pedro, MD;  Location: [facility];  Service: Ophthalmology;  Laterality: Left;  . RIGHT SHOULDER SURGERY    . SCLERAL BUCKLE Left 04/08/2016   Procedure: SCLERAL BUCKLE;  Surgeon: Hayden Pedro, MD;  Location: [place];  Service: Ophthalmology;  Laterality: Left;    Current Outpatient Medications  Medication Sig  Dispense Refill  . aspirin EC 81 MG tablet Take 1 tablet (81 mg total) by mouth daily.    Marland Kitchen lisinopril (PRINIVIL,ZESTRIL) 20 MG tablet Take 20 mg by mouth 2 (two) times daily.     Marland Kitchen LORazepam (ATIVAN) 1 MG tablet Take 1 mg by mouth.     . Multiple Vitamin (MULTIVITAMIN) tablet Take 1 tablet by mouth daily.    . nitroGLYCERIN (NITROSTAT) 0.4 MG SL tablet Place 1 tablet (0.4 mg total) under the tongue every 5 (five) minutes as needed for chest pain. 25 tablet 3  . omeprazole (PRILOSEC) 20 MG capsule Take 20 mg by mouth every morning.     . pravastatin (PRAVACHOL) 40 MG tablet Take 40 mg by mouth every evening.     . Probiotic Product (PROBIOTIC-10 PO)  Take 1 capsule by mouth daily as needed.     No current facility-administered medications for this visit.    Allergies:  Codeine   Social History: The patient  reports that he has been smoking cigarettes. He started smoking about 58 years ago. He has a 50.00 pack-year smoking history. He quit smokeless tobacco use about 50 years ago.  His smokeless tobacco use included chew. He reports current alcohol use. He reports current drug use. Drug: Marijuana.   ROS:  Please see the history of present illness. Otherwise, complete review of systems is positive for arthritic pains, insomnia.  All other systems are reviewed and negative.   Physical Exam: VS:  BP (!) 148/98   Pulse 63   Ht 5\' 10"  (1.778 m)   Wt 220 lb (99.8 kg)   SpO2 98%   BMI 31.57 kg/m , BMI Body mass index is 31.57 kg/m.  Wt Readings from Last 3 Encounters:  12/29/18 220 lb (99.8 kg)  02/24/18 206 lb (93.4 kg)  12/23/17 207 lb (93.9 kg)    General: Patient appears comfortable at rest. HEENT: Conjunctiva and lids normal, oropharynx clear. Neck: Supple, no elevated JVP or carotid bruits, no thyromegaly. Lungs: Clear to auscultation, nonlabored breathing at rest. Cardiac: Regular rate and rhythm, no S3 or significant systolic murmur. Abdomen: Soft, nontender, bowel sounds present. Extremities: No pitting edema, distal pulses 2+. Skin: Warm and dry. Musculoskeletal: No kyphosis. Neuropsychiatric: Alert and oriented x3, affect grossly appropriate.  ECG: I personally reviewed the tracing from 12/23/2017 which showed sinus rhythm with right bundle branch block and left anterior fascicular block.  Recent Labwork: 02/24/2018: ALT 16; AST 16; BUN 8; Creatinine, Ser 0.77; Hemoglobin 16.0; Platelets 204; Potassium 3.4; Sodium 136   Other Studies Reviewed Today:  Carotid Dopplers 01/07/2017: 1-39% bilateral ICA stenoses.  Cardiac catheterization February 2014: Native vessel CAD with patent LIMA to LAD, patent SVG to OM, patent  SVG to diagonal, and occluded SVG to RCA, with a chronically occluded RCA associated with left to right collaterals.  Assessment and Plan:  1.  Multivessel CAD status post CABG.  Patient is known to have an occluded SVG to RCA and chronically occluded RCA with left-to-right collaterals.  He reports no progressive angina symptoms on medical therapy and we will continue with observation.  ECG reviewed.  2.  Mixed hyperlipidemia on Pravachol.  Requesting lab work from Dr. Nevada Crane.  3.  The patient was counseled on tobacco cessation today for 5 minutes.  Counseling included reviewing the risks of smoking tobacco products, how it impacts the patient's current medical diagnoses and different strategies for quitting.  Pharmacotherapy to aid in tobacco cessation was not prescribed today.  4.  Mild carotid atherosclerosis, asymptomatic.  Continue aspirin and statin therapy.  Current medicines were reviewed with the patient today.   Orders Placed This Encounter  Procedures  . EKG 12-Lead    Disposition: Follow-up in 1 year.  Signed, Satira Sark, MD, [facility] 12/29/2018 10:02 AM    [facility] [place], [place], [place] [address] Phone: [phone]; Fax: [phone]

## 2018-12-29 ENCOUNTER — Encounter: Payer: Self-pay | Admitting: Cardiology

## 2018-12-29 ENCOUNTER — Encounter: Payer: Self-pay | Admitting: *Deleted

## 2018-12-29 ENCOUNTER — Ambulatory Visit (INDEPENDENT_AMBULATORY_CARE_PROVIDER_SITE_OTHER): Payer: Medicare Other | Admitting: Cardiology

## 2018-12-29 VITALS — BP 148/98 | HR 63 | Ht 70.0 in | Wt 220.0 lb

## 2018-12-29 DIAGNOSIS — I6523 Occlusion and stenosis of bilateral carotid arteries: Secondary | ICD-10-CM

## 2018-12-29 DIAGNOSIS — I25119 Atherosclerotic heart disease of native coronary artery with unspecified angina pectoris: Secondary | ICD-10-CM

## 2018-12-29 DIAGNOSIS — F17210 Nicotine dependence, cigarettes, uncomplicated: Secondary | ICD-10-CM | POA: Diagnosis not present

## 2018-12-29 DIAGNOSIS — E782 Mixed hyperlipidemia: Secondary | ICD-10-CM | POA: Diagnosis not present

## 2018-12-29 DIAGNOSIS — Z720 Tobacco use: Secondary | ICD-10-CM | POA: Diagnosis not present

## 2018-12-29 NOTE — Patient Instructions (Signed)

## 2019-01-14 DIAGNOSIS — J449 Chronic obstructive pulmonary disease, unspecified: Secondary | ICD-10-CM | POA: Diagnosis not present

## 2019-02-12 DIAGNOSIS — J449 Chronic obstructive pulmonary disease, unspecified: Secondary | ICD-10-CM | POA: Diagnosis not present

## 2019-03-15 DIAGNOSIS — J449 Chronic obstructive pulmonary disease, unspecified: Secondary | ICD-10-CM | POA: Diagnosis not present

## 2019-04-08 DIAGNOSIS — Z0000 Encounter for general adult medical examination without abnormal findings: Secondary | ICD-10-CM | POA: Diagnosis not present

## 2019-04-14 DIAGNOSIS — J449 Chronic obstructive pulmonary disease, unspecified: Secondary | ICD-10-CM | POA: Diagnosis not present

## 2019-04-22 ENCOUNTER — Encounter: Payer: Self-pay | Admitting: Cardiology

## 2019-04-22 DIAGNOSIS — I10 Essential (primary) hypertension: Secondary | ICD-10-CM | POA: Diagnosis not present

## 2019-04-22 DIAGNOSIS — E782 Mixed hyperlipidemia: Secondary | ICD-10-CM | POA: Diagnosis not present

## 2019-04-22 DIAGNOSIS — R7301 Impaired fasting glucose: Secondary | ICD-10-CM | POA: Diagnosis not present

## 2019-04-27 DIAGNOSIS — I2510 Atherosclerotic heart disease of native coronary artery without angina pectoris: Secondary | ICD-10-CM | POA: Diagnosis not present

## 2019-04-27 DIAGNOSIS — E782 Mixed hyperlipidemia: Secondary | ICD-10-CM | POA: Diagnosis not present

## 2019-04-27 DIAGNOSIS — I209 Angina pectoris, unspecified: Secondary | ICD-10-CM | POA: Diagnosis not present

## 2019-04-27 DIAGNOSIS — K219 Gastro-esophageal reflux disease without esophagitis: Secondary | ICD-10-CM | POA: Diagnosis not present

## 2019-04-27 DIAGNOSIS — I10 Essential (primary) hypertension: Secondary | ICD-10-CM | POA: Diagnosis not present

## 2019-05-15 DIAGNOSIS — J449 Chronic obstructive pulmonary disease, unspecified: Secondary | ICD-10-CM | POA: Diagnosis not present

## 2019-05-23 DIAGNOSIS — I10 Essential (primary) hypertension: Secondary | ICD-10-CM | POA: Diagnosis not present

## 2019-05-23 DIAGNOSIS — J449 Chronic obstructive pulmonary disease, unspecified: Secondary | ICD-10-CM | POA: Diagnosis not present

## 2019-05-23 DIAGNOSIS — M1990 Unspecified osteoarthritis, unspecified site: Secondary | ICD-10-CM | POA: Diagnosis not present

## 2019-06-13 DIAGNOSIS — I209 Angina pectoris, unspecified: Secondary | ICD-10-CM | POA: Diagnosis not present

## 2019-06-13 DIAGNOSIS — D72829 Elevated white blood cell count, unspecified: Secondary | ICD-10-CM | POA: Diagnosis not present

## 2019-06-13 DIAGNOSIS — Z23 Encounter for immunization: Secondary | ICD-10-CM | POA: Diagnosis not present

## 2019-06-13 DIAGNOSIS — R7301 Impaired fasting glucose: Secondary | ICD-10-CM | POA: Diagnosis not present

## 2019-06-14 DIAGNOSIS — J449 Chronic obstructive pulmonary disease, unspecified: Secondary | ICD-10-CM | POA: Diagnosis not present

## 2019-07-15 DIAGNOSIS — J449 Chronic obstructive pulmonary disease, unspecified: Secondary | ICD-10-CM | POA: Diagnosis not present

## 2019-08-15 DIAGNOSIS — Z23 Encounter for immunization: Secondary | ICD-10-CM | POA: Diagnosis not present

## 2019-08-15 DIAGNOSIS — J449 Chronic obstructive pulmonary disease, unspecified: Secondary | ICD-10-CM | POA: Diagnosis not present

## 2019-08-26 DIAGNOSIS — J449 Chronic obstructive pulmonary disease, unspecified: Secondary | ICD-10-CM | POA: Diagnosis not present

## 2019-08-26 DIAGNOSIS — I10 Essential (primary) hypertension: Secondary | ICD-10-CM | POA: Diagnosis not present

## 2019-08-26 DIAGNOSIS — M1990 Unspecified osteoarthritis, unspecified site: Secondary | ICD-10-CM | POA: Diagnosis not present

## 2019-10-20 DIAGNOSIS — I10 Essential (primary) hypertension: Secondary | ICD-10-CM | POA: Diagnosis not present

## 2019-10-20 DIAGNOSIS — D72829 Elevated white blood cell count, unspecified: Secondary | ICD-10-CM | POA: Diagnosis not present

## 2019-10-20 DIAGNOSIS — E782 Mixed hyperlipidemia: Secondary | ICD-10-CM | POA: Diagnosis not present

## 2019-10-20 DIAGNOSIS — R7301 Impaired fasting glucose: Secondary | ICD-10-CM | POA: Diagnosis not present

## 2019-10-24 DIAGNOSIS — I10 Essential (primary) hypertension: Secondary | ICD-10-CM | POA: Diagnosis not present

## 2019-10-24 DIAGNOSIS — I2510 Atherosclerotic heart disease of native coronary artery without angina pectoris: Secondary | ICD-10-CM | POA: Diagnosis not present

## 2019-10-24 DIAGNOSIS — K219 Gastro-esophageal reflux disease without esophagitis: Secondary | ICD-10-CM | POA: Diagnosis not present

## 2019-10-24 DIAGNOSIS — E782 Mixed hyperlipidemia: Secondary | ICD-10-CM | POA: Diagnosis not present

## 2019-11-10 DIAGNOSIS — H524 Presbyopia: Secondary | ICD-10-CM | POA: Diagnosis not present

## 2019-12-22 ENCOUNTER — Telehealth: Payer: Self-pay | Admitting: Cardiology

## 2019-12-22 NOTE — Telephone Encounter (Signed)
Virtual Visit Pre-Appointment Phone Call  "(Name), I am calling you today to discuss your upcoming appointment. We are currently trying to limit exposure to the virus that causes COVID-19 by seeing patients at home rather than in the office."  1. "What is the BEST phone number to call the day of the visit?" - include this in appointment notes  2. Do you have or have access to (through a family member/friend) a smartphone with video capability that we can use for your visit?" a. If yes - list this number in appt notes as cell (if different from BEST phone #) and list the appointment type as a VIDEO visit in appointment notes b. If no - list the appointment type as a PHONE visit in appointment notes  3. Confirm consent - "In the setting of the current Covid19 crisis, you are scheduled for a (phone or video) visit with your provider on (date) at (time).  Just as we do with many in-office visits, in order for you to participate in this visit, we must obtain consent.  If you'd like, I can send this to your mychart (if signed up) or email for you to review.  Otherwise, I can obtain your verbal consent now.  All virtual visits are billed to your insurance company just like a normal visit would be.  By agreeing to a virtual visit, we'd like you to understand that the technology does not allow for your provider to perform an examination, and thus may limit your provider's ability to fully assess your condition. If your provider identifies any concerns that need to be evaluated in person, we will make arrangements to do so.  Finally, though the technology is pretty good, we cannot assure that it will always work on either your or our end, and in the setting of a video visit, we may have to convert it to a phone-only visit.  In either situation, we cannot ensure that we have a secure connection.  Are you willing to proceed?" STAFF: Did the patient verbally acknowledge consent to telehealth visit? Document  YES/NO here: yes  4. Advise patient to be prepared - "Two hours prior to your appointment, go ahead and check your blood pressure, pulse, oxygen saturation, and your weight (if you have the equipment to check those) and write them all down. When your visit starts, your provider will ask you for this information. If you have an Apple Watch or Kardia device, please plan to have heart rate information ready on the day of your appointment. Please have a pen and paper handy nearby the day of the visit as well."  5. Give patient instructions for MyChart download to smartphone OR Doximity/Doxy.me as below if video visit (depending on what platform provider is using)  6. Inform patient they will receive a phone call 15 minutes prior to their appointment time (may be from unknown caller ID) so they should be prepared to answer    TELEPHONE CALL NOTE  Brandon Pratt has been deemed a candidate for a follow-up tele-health visit to limit community exposure during the Covid-19 pandemic. I spoke with the patient via phone to ensure availability of phone/video source, confirm preferred email & phone number, and discuss instructions and expectations.  I reminded Brandon Pratt to be prepared with any vital sign and/or heart rhythm information that could potentially be obtained via home monitoring, at the time of his visit. I reminded Brandon Pratt to expect a phone call prior to  his visit.  Weston Anna 12/22/2019 2:37 PM   INSTRUCTIONS FOR DOWNLOADING THE MYCHART APP TO SMARTPHONE  - The patient must first make sure to have activated MyChart and know their login information - If Apple, go to [employer] and type in MyChart in the search bar and download the app. If Android, ask patient to go to [employer] and type in [place] in the search bar and download the app. The app is free but as with any other app downloads, their phone may require them to verify saved payment information or Apple/Android  password.  - The patient will need to then log into the app with their MyChart username and password, and select [facility] as their healthcare provider to link the account. When it is time for your visit, go to the MyChart app, find appointments, and click Begin Video Visit. Be sure to Select Allow for your device to access the Microphone and Camera for your visit. You will then be connected, and your provider will be with you shortly.  **If they have any issues connecting, or need assistance please contact MyChart service desk [phone])**  **If using a computer, in order to ensure the best quality for their visit they will need to use either of the following Internet Browsers: [employer], or Google Chrome**  IF USING DOXIMITY or DOXY.ME - The patient will receive a link just prior to their visit by text.     FULL LENGTH CONSENT FOR TELE-HEALTH VISIT   I hereby voluntarily request, consent and authorize [facility] and its employed or contracted physicians, physician assistants, nurse practitioners or other licensed health care professionals (the Practitioner), to provide me with telemedicine health care services (the Services") as deemed necessary by the treating Practitioner. I acknowledge and consent to receive the Services by the Practitioner via telemedicine. I understand that the telemedicine visit will involve communicating with the Practitioner through live audiovisual communication technology and the disclosure of certain medical information by electronic transmission. I acknowledge that I have been given the opportunity to request an in-person assessment or other available alternative prior to the telemedicine visit and am voluntarily participating in the telemedicine visit.  I understand that I have the right to withhold or withdraw my consent to the use of telemedicine in the course of my care at any time, without affecting my right to future care or treatment,  and that the Practitioner or I may terminate the telemedicine visit at any time. I understand that I have the right to inspect all information obtained and/or recorded in the course of the telemedicine visit and may receive copies of available information for a reasonable fee.  I understand that some of the potential risks of receiving the Services via telemedicine include:   Delay or interruption in medical evaluation due to technological equipment failure or disruption;  Information transmitted may not be sufficient (e.g. poor resolution of images) to allow for appropriate medical decision making by the Practitioner; and/or   In rare instances, security protocols could fail, causing a breach of personal health information.  Furthermore, I acknowledge that it is my responsibility to provide information about my medical history, conditions and care that is complete and accurate to the best of my ability. I acknowledge that Practitioner's advice, recommendations, and/or decision may be based on factors not within their control, such as incomplete or inaccurate data provided by me or distortions of diagnostic images or specimens that may result from electronic transmissions. I  understand that the practice of medicine is not an exact science and that Practitioner makes no warranties or guarantees regarding treatment outcomes. I acknowledge that I will receive a copy of this consent concurrently upon execution via email to the email address I last provided but may also request a printed copy by calling the office of [facility].    I understand that my insurance will be billed for this visit.   I have read or had this consent read to me.  I understand the contents of this consent, which adequately explains the benefits and risks of the Services being provided via telemedicine.   I have been provided ample opportunity to ask questions regarding this consent and the Services and have had my questions  answered to my satisfaction.  I give my informed consent for the services to be provided through the use of telemedicine in my medical care  By participating in this telemedicine visit I agree to the above.

## 2020-01-02 ENCOUNTER — Telehealth (INDEPENDENT_AMBULATORY_CARE_PROVIDER_SITE_OTHER): Payer: Medicare Other | Admitting: Cardiology

## 2020-01-02 ENCOUNTER — Encounter: Payer: Self-pay | Admitting: Cardiology

## 2020-01-02 VITALS — Ht 70.0 in | Wt 213.0 lb

## 2020-01-02 DIAGNOSIS — I6523 Occlusion and stenosis of bilateral carotid arteries: Secondary | ICD-10-CM | POA: Diagnosis not present

## 2020-01-02 DIAGNOSIS — Z720 Tobacco use: Secondary | ICD-10-CM | POA: Diagnosis not present

## 2020-01-02 DIAGNOSIS — I25119 Atherosclerotic heart disease of native coronary artery with unspecified angina pectoris: Secondary | ICD-10-CM | POA: Diagnosis not present

## 2020-01-02 DIAGNOSIS — E782 Mixed hyperlipidemia: Secondary | ICD-10-CM | POA: Diagnosis not present

## 2020-01-02 MED ORDER — NITROGLYCERIN 0.4 MG SL SUBL: 0.4000 mg | SUBLINGUAL_TABLET | SUBLINGUAL | 3 refills | Status: DC | PRN

## 2020-01-02 NOTE — Progress Notes (Signed)
Virtual Visit via Telephone Note   This visit type was conducted due to national recommendations for restrictions regarding the COVID-19 Pandemic (e.g. social distancing) in an effort to limit this patient's exposure and mitigate transmission in our community.  Due to his co-morbid illnesses, this patient is at least at moderate risk for complications without adequate follow up.  This format is felt to be most appropriate for this patient at this time.  The patient did not have access to video technology/had technical difficulties with video requiring transitioning to audio format only (telephone).  All issues noted in this document were discussed and addressed.  No physical exam could be performed with this format.  Please refer to the patient's chart for his  consent to telehealth for [facility].   Date:  01/02/2020   ID:  Brandon Pratt, DOB [birth year], MRN [id number]  Patient Location: Home Provider Location: Office  PCP:  Celene Squibb, MD  Cardiologist:  Rozann Lesches, MD Electrophysiologist:  None   Evaluation Performed:  Follow-Up Visit  Chief Complaint:  Cardiac follow-up  History of Present Illness:    Brandon Pratt is a 64 y.o. male last seen in January 2020.  We spoke by phone today.  He states that he has not had any significant angina symptoms or use nitroglycerin.  Still working on cutting back his smoking, we have talked about cessation strategies over time, but he has had a difficult time quitting.  I reviewed his medications which are outlined below.  Cardiac regimen includes aspirin, lisinopril, Pravachol, and as needed nitroglycerin.  We will send in a refill for fresh bottle.  We discussed follow-up carotid Dopplers.  Last study was in 2018.  I reviewed his lab work from November 2020 as outlined below.  The patient does not have symptoms concerning for COVID-19 infection (fever, chills, cough, or new shortness of breath).    Past Medical History:  Diagnosis  Date  . Anxiety   . Arthritis   . Chronic pain syndrome   . Coronary atherosclerosis of native coronary artery    Multivessel, occluded SVG to RCA 2/14 - managed medically  . Depression   . Essential hypertension   . GERD (gastroesophageal reflux disease)   . History of bronchitis 20+ yrs ago  . History of hiatal hernia   . Joint pain   . Mixed hyperlipidemia    Past Surgical History:  Procedure Laterality Date  . APPENDECTOMY    . CARDIAC CATHETERIZATION  2014  . CARPAL TUNNEL RELEASE Left   . CERVICAL SPINE SURGERY    . COLONOSCOPY    . CORONARY ARTERY BYPASS GRAFT  12/2009   LIMA to LAD, SVG to OM, SVG to diagonal, SVG to RCA  . ELBOW SURGERY     "moved nerves over"  . GAS/FLUID EXCHANGE Left 03/13/2015   Procedure: GAS/FLUID EXCHANGE;  Surgeon: Hayden Pedro, MD;  Location: [facility];  Service: Ophthalmology;  Laterality: Left;  Marland Kitchen GAS/FLUID EXCHANGE Left 04/08/2016   Procedure: GAS/FLUID EXCHANGE;  Surgeon: Hayden Pedro, MD;  Location: [facility];  Service: Ophthalmology;  Laterality: Left;  [id number]  . HAND SURGERY Right    carpatal tunnel x2  . LASER PHOTO ABLATION Left 03/13/2015   Procedure: LASER PHOTO ABLATION;  Surgeon: Hayden Pedro, MD;  Location: [facility];  Service: Ophthalmology;  Laterality: Left;  Endolaser  . LASER PHOTO ABLATION Left 04/08/2016   Procedure: LASER PHOTO ABLATION;  Surgeon: Hayden Pedro, MD;  Location:  [facility];  Service: Ophthalmology;  Laterality: Left;  Headscope laser  . LEFT SHOULDER SURGERY    . MEMBRANE PEEL Left 03/13/2015   Procedure: MEMBRANE PEEL;  Surgeon: Hayden Pedro, MD;  Location: [facility];  Service: Ophthalmology;  Laterality: Left;  . PARS PLANA VITRECTOMY Left 03/13/2015   Procedure: PARS PLANA VITRECTOMY WITH 25 GAUGE;  Surgeon: Hayden Pedro, MD;  Location: [facility];  Service: Ophthalmology;  Laterality: Left;  . RIGHT SHOULDER SURGERY    . SCLERAL BUCKLE Left 04/08/2016   Procedure: SCLERAL BUCKLE;  Surgeon: Hayden Pedro, MD;  Location:  [facility];  Service: Ophthalmology;  Laterality: Left;     Current Meds  Medication Sig  . albuterol (PROVENTIL) (2.5 MG/3ML) 0.083% nebulizer solution as needed.  Marland Kitchen aspirin EC 81 MG tablet Take 1 tablet (81 mg total) by mouth daily.  Marland Kitchen lisinopril (PRINIVIL,ZESTRIL) 20 MG tablet Take 20 mg by mouth daily.   Marland Kitchen LORazepam (ATIVAN) 1 MG tablet Take 1 mg by mouth.   . Multiple Vitamin (MULTIVITAMIN) tablet Take 1 tablet by mouth daily.  . nitroGLYCERIN (NITROSTAT) 0.4 MG SL tablet Place 1 tablet (0.4 mg total) under the tongue every 5 (five) minutes as needed for chest pain.  Marland Kitchen omeprazole (PRILOSEC) 20 MG capsule Take 20 mg by mouth every morning.   . pravastatin (PRAVACHOL) 40 MG tablet Take 40 mg by mouth every evening.   . Probiotic Product (PROBIOTIC-10 PO) Take 1 capsule by mouth daily as needed.  . [DISCONTINUED] nitroGLYCERIN (NITROSTAT) 0.4 MG SL tablet Place 1 tablet (0.4 mg total) under the tongue every 5 (five) minutes as needed for chest pain.     Allergies:   Codeine   Social History   Tobacco Use  . Smoking status: Current Every Day Smoker    Packs/day: 1.00    Years: 50.00    Pack years: 50.00    Types: Cigarettes    Start date: 05/13/1960  . Smokeless tobacco: Former Systems developer    Types: Chew    Quit date: 12/01/1968  Substance Use Topics  . Alcohol use: Yes    Comment: rare  . Drug use: Yes    Types: Marijuana    Comment: Smokes POT occasionally to help with pain. last time few days ago     Family Hx: The patient's family history includes Diabetes in his mother; Hypertension in his mother; Other in his mother; Stroke in his father.  ROS:   Please see the history of present illness. All other systems reviewed and are negative.   Prior CV studies:   The following studies were reviewed today:  Carotid Dopplers 01/07/2017: 1-39% bilateral ICA stenoses.  Cardiac catheterization February 2014: Native vessel CAD with patent LIMA to LAD, patent SVG to OM, patent SVG to  diagonal, and occluded SVG to RCA, with a chronically occluded RCA associated with left to right collaterals.  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  Recent Labs:  November 2020: Hemoglobin 17.2, platelets 224, BUN 5, creatinine 0.82, potassium 4.5, AST 20, ALT 21, cholesterol 139, triglycerides 162, HDL 35, LDL 76, hemoglobin A1c 5.6%  Wt Readings from Last 3 Encounters:  01/02/20 213 lb (96.6 kg)  12/29/18 220 lb (99.8 kg)  02/24/18 206 lb (93.4 kg)     Objective:    Vital Signs:  Ht 5\' 10"  (1.778 m)   Wt 213 lb (96.6 kg)   BMI 30.56 kg/m    He was not able to obtain vital signs this morning. Patient spoke in full sentences, not obviously short of breath. No audible wheezing or coughing.  ASSESSMENT & PLAN:    1.  Multivessel CAD status post CABG with subsequently documented graft disease.  SVG to RCA is occluded with known occlusion of the RCA associated with left-to-right collaterals.  He reports no progressive angina on medical therapy.  Continue aspirin, ACE inhibitor, and statin.  Refill provided for fresh bottle of nitroglycerin.  We will get ECG in office.  2.  Carotid artery disease, mild by last assessment in 2018 and asymptomatic.  Follow-up carotid Dopplers will be obtained.  Continue aspirin and statin.  3.  Longstanding tobacco abuse.  We will continue to address smoking cessation.  He states that he has cut back.  4.  Mixed hyperlipidemia, continues on Pravachol.  Last LDL 76.  Time:   Today, I have spent 5 minutes with the patient with telehealth technology discussing the above problems.     Medication Adjustments/Labs and Tests Ordered: Current medicines are reviewed at length with the patient today.  Concerns regarding medicines are outlined above.   Tests  Ordered: Orders Placed This Encounter  Procedures  . VAS US CAROTID    Medication Changes: Meds ordered this encounter  Medications  . nitroGLYCERIN (NITROSTAT) 0.4 MG SL tablet    Sig: Place 1 tablet (0.4 mg total) under the tongue every 5 (five) minutes as needed for chest pain.    Dispense:  25 tablet    Refill:  3    Follow Up:  In Person 1 year in the [place] office.  Signed, Rozann Lesches, MD  01/02/2020 9:46 AM    [facility]

## 2020-01-02 NOTE — Patient Instructions (Addendum)
Medication Instructions:   Nitroglycerin refilled today.   Continue all other current medications.  Labwork: none  Testing/Procedures: Your physician has requested that you have a carotid duplex. This test is an ultrasound of the carotid arteries in your neck. It looks at blood flow through these arteries that supply the brain with blood. Allow one hour for this exam. There are no restrictions or special instructions.  Office will contact with results via phone or letter.     Follow-Up: Your physician wants you to follow up in:  1 year.  You will receive a reminder letter in the mail one-two months in advance.  If you don't receive a letter, please call our office to schedule the follow up appointment   Any Other Special Instructions Will Be Listed Below (If Applicable). Nurse visit for EKG on day that you have your carotid doppler done.   If you need a refill on your cardiac medications before your next appointment, please call your pharmacy.

## 2020-01-19 ENCOUNTER — Ambulatory Visit: Payer: Medicare Other

## 2020-01-23 ENCOUNTER — Ambulatory Visit: Payer: Medicare Other

## 2020-02-15 ENCOUNTER — Other Ambulatory Visit: Payer: Self-pay

## 2020-02-15 ENCOUNTER — Ambulatory Visit (INDEPENDENT_AMBULATORY_CARE_PROVIDER_SITE_OTHER): Payer: Medicare Other | Admitting: *Deleted

## 2020-02-15 ENCOUNTER — Ambulatory Visit (INDEPENDENT_AMBULATORY_CARE_PROVIDER_SITE_OTHER): Payer: Medicare Other

## 2020-02-15 DIAGNOSIS — I25119 Atherosclerotic heart disease of native coronary artery with unspecified angina pectoris: Secondary | ICD-10-CM | POA: Diagnosis not present

## 2020-02-15 DIAGNOSIS — I6523 Occlusion and stenosis of bilateral carotid arteries: Secondary | ICD-10-CM

## 2020-02-17 ENCOUNTER — Telehealth: Payer: Self-pay | Admitting: *Deleted

## 2020-02-17 NOTE — Telephone Encounter (Signed)
Patient informed. Copy sent to PCP °

## 2020-02-17 NOTE — Telephone Encounter (Signed)
-----   Message from Satira Sark, MD sent at 02/16/2020  9:56 AM EDT ----- Results reviewed.  Follow-up carotid Doppler showed mild bilateral ICA stenosis.  Continue with current follow-up plan.

## 2020-02-21 DIAGNOSIS — I6522 Occlusion and stenosis of left carotid artery: Secondary | ICD-10-CM | POA: Diagnosis not present

## 2020-02-21 DIAGNOSIS — I6521 Occlusion and stenosis of right carotid artery: Secondary | ICD-10-CM | POA: Diagnosis not present

## 2020-02-29 ENCOUNTER — Telehealth (HOSPITAL_COMMUNITY): Payer: Self-pay | Admitting: Physical Therapy

## 2020-02-29 ENCOUNTER — Ambulatory Visit (HOSPITAL_COMMUNITY): Payer: Medicare Other | Admitting: Physical Therapy

## 2020-02-29 ENCOUNTER — Encounter (HOSPITAL_COMMUNITY): Payer: Self-pay

## 2020-02-29 NOTE — Telephone Encounter (Signed)
pt has diarrhea and vomiting since recieving COVID shot on last Friday. Will call us back to reschedule when he is feeling better

## 2020-03-08 ENCOUNTER — Encounter (HOSPITAL_COMMUNITY): Payer: Self-pay | Admitting: Physical Therapy

## 2020-03-08 ENCOUNTER — Ambulatory Visit (HOSPITAL_COMMUNITY): Payer: Medicare Other | Attending: Internal Medicine | Admitting: Physical Therapy

## 2020-03-08 ENCOUNTER — Other Ambulatory Visit: Payer: Self-pay

## 2020-03-08 DIAGNOSIS — R2689 Other abnormalities of gait and mobility: Secondary | ICD-10-CM | POA: Diagnosis not present

## 2020-03-08 DIAGNOSIS — M6281 Muscle weakness (generalized): Secondary | ICD-10-CM | POA: Diagnosis not present

## 2020-03-08 DIAGNOSIS — Z9181 History of falling: Secondary | ICD-10-CM | POA: Insufficient documentation

## 2020-03-08 NOTE — Therapy (Signed)
[facility] [address] [place], [place], [address] Phone: [phone]   Fax:  [phone]  Physical Therapy Evaluation  Patient Details  Name: Brandon Pratt MRN: [id number] Date of Birth: 1956-09-17 Referring Provider (PT): Edwinna Areola. [place] Crane   Encounter Date: 03/08/2020  PT End of Session - 03/08/20 1433    Visit Number  1    Number of Visits  8    Date for PT Re-Evaluation  04/05/20    Authorization Type  [employer] Medicare    Authorization Time Period  03/08/20 - 04/05/20    Progress Note Due on Visit  8    PT Start Time  1120    PT Stop Time  1158    PT Time Calculation (min)  38 min    Equipment Utilized During Treatment  Gait belt    Activity Tolerance  Patient tolerated treatment well    Behavior During Therapy  WFL for tasks assessed/performed       Past Medical History:  Diagnosis Date  . Anxiety   . Arthritis   . Chronic pain syndrome   . Coronary atherosclerosis of native coronary artery    Multivessel, occluded SVG to RCA 2/14 - managed medically  . Depression   . Essential hypertension   . GERD (gastroesophageal reflux disease)   . History of bronchitis 20+ yrs ago  . History of hiatal hernia   . Joint pain   . Mixed hyperlipidemia     Past Surgical History:  Procedure Laterality Date  . APPENDECTOMY    . CARDIAC CATHETERIZATION  2014  . CARPAL TUNNEL RELEASE Left   . CERVICAL SPINE SURGERY    . COLONOSCOPY    . CORONARY ARTERY BYPASS GRAFT  12/2009   LIMA to LAD, SVG to OM, SVG to diagonal, SVG to RCA  . ELBOW SURGERY     "moved nerves over"  . GAS/FLUID EXCHANGE Left 03/13/2015   Procedure: GAS/FLUID EXCHANGE;  Surgeon: Hayden Pedro, MD;  Location: [facility];  Service: Ophthalmology;  Laterality: Left;  Marland Kitchen GAS/FLUID EXCHANGE Left 04/08/2016   Procedure: GAS/FLUID EXCHANGE;  Surgeon: Hayden Pedro, MD;  Location: [facility];  Service: Ophthalmology;  Laterality: Left;  C3F8  . HAND SURGERY Right    carpatal tunnel x2  .  LASER PHOTO ABLATION Left 03/13/2015   Procedure: LASER PHOTO ABLATION;  Surgeon: Hayden Pedro, MD;  Location: [facility];  Service: Ophthalmology;  Laterality: Left;  Endolaser  . LASER PHOTO ABLATION Left 04/08/2016   Procedure: LASER PHOTO ABLATION;  Surgeon: Hayden Pedro, MD;  Location: [facility];  Service: Ophthalmology;  Laterality: Left;  Headscope laser  . LEFT SHOULDER SURGERY    . MEMBRANE PEEL Left 03/13/2015   Procedure: MEMBRANE PEEL;  Surgeon: Hayden Pedro, MD;  Location: [facility];  Service: Ophthalmology;  Laterality: Left;  . PARS PLANA VITRECTOMY Left 03/13/2015   Procedure: PARS PLANA VITRECTOMY WITH 25 GAUGE;  Surgeon: Hayden Pedro, MD;  Location: [facility];  Service: Ophthalmology;  Laterality: Left;  . RIGHT SHOULDER SURGERY    . SCLERAL BUCKLE Left 04/08/2016   Procedure: SCLERAL BUCKLE;  Surgeon: Hayden Pedro, MD;  Location: [facility];  Service: Ophthalmology;  Laterality: Left;    There were no vitals filed for this visit.   Subjective Assessment - 03/08/20 1125    Subjective  Patient reported that in 1986 he fell 30 feet and landed on his feet. He reported that after this  he was told he wouldn't walk again, but that he has been able to walk. The Patient reported that he's had more difficulty with his balance in the last several months, but is not sure why. He reported he's had several falls. He reported that he feels like his right knee gives out sometimes. He reported that if he turns around he sometimes loses his balance. Patient reported that he has a cane, but that he doesn't use it all the time.    Patient Stated Goals  To have better balance    Currently in Pain?  Yes    Pain Score  4     Pain Location  Knee   and ankle   Pain Orientation  Right    Pain Descriptors / Indicators  Nagging    Pain Type  Chronic pain    Pain Onset  More than a month ago    Pain Frequency  Constant    Aggravating Factors   Walking    Pain Relieving Factors  Biofreeze         [facility] PT  Assessment - 03/08/20 0001      Assessment   Medical Diagnosis  Balance, and gait training    Referring Provider (PT)  Edwinna Areola. [place]    Onset Date/Surgical Date  --   Last several months   Prior Therapy  Yes, following falling 30 feet      Balance Screen   Has the patient fallen in the past 6 months  Yes    How many times?  25-30    Has the patient had a decrease in activity level because of a fear of falling?   Yes    Is the patient reluctant to leave their home because of a fear of falling?   Yes      [place] residence    Living Arrangements  Spouse/significant other      Prior Function   Level of [place];Independent with basic ADLs    Vocation  On disability      Cognition   Overall Cognitive Status  Within Functional Limits for tasks assessed      ROM / Strength   AROM / PROM / Strength  Strength;AROM      AROM   Overall AROM Comments  Knee AROM WFL bilaterally      Strength   Strength Assessment Site  Hip;Knee;Ankle    Right/Left Hip  Right;Left    Right Hip Flexion  4-/5    Right Hip Extension  3-/5    Right Hip ABduction  4+/5    Left Hip Flexion  4+/5    Left Hip Extension  3-/5    Left Hip ABduction  4+/5    Right/Left Knee  Right;Left    Right Knee Flexion  4+/5    Right Knee Extension  4+/5    Left Knee Flexion  4+/5    Left Knee Extension  5/5    Right/Left Ankle  Right;Left    Right Ankle Dorsiflexion  5/5    Left Ankle Dorsiflexion  5/5      Ambulation/Gait   Ambulation/Gait  Yes    Ambulation Distance (Feet)  50 Feet    Assistive device  None    Gait Pattern  Poor foot clearance - left;Poor foot clearance - right;Wide base of support    Gait Comments  Decreased gait velocity observed       Balance  Balance Assessed  Yes      Static Standing Balance   Static Standing Balance -  Activities   Single Leg Stance - Right Leg;Single Leg Stance - Left Leg    Static Standing - Comment/# of  Minutes  LT: 12 seconds; RT: 3 seconds      Standardized Balance Assessment   Standardized Balance Assessment  Dynamic Gait Index      Dynamic Gait Index   Level Surface  Normal    Change in Gait Speed  Normal    Gait with Horizontal Head Turns  Mild Impairment    Gait with Vertical Head Turns  Mild Impairment    Gait and Pivot Turn  Normal    Step Over Obstacle  Mild Impairment    Step Around Obstacles  Mild Impairment    Steps  Moderate Impairment    Total Score  18                Objective measurements completed on examination: See above findings.              PT Education - 03/08/20 1433    Education Details  Discussed examination findings, POC, and initial HEP.    Person(s) Educated  Patient    Methods  Explanation;Handout    Comprehension  Verbalized understanding       PT Short Term Goals - 03/08/20 1436      PT SHORT TERM GOAL #1   Title  Patient will report understanding and regular compliance of HEP to improve strength, balance, and overall functional mobility.    Time  2    Period  Weeks    Status  New    Target Date  03/22/20      PT SHORT TERM GOAL #2   Title  Patient will report overall improvement in symptoms of at least 25% for improved QOL.    Time  2    Period  Weeks    Status  New    Target Date  03/22/20        PT Long Term Goals - 03/08/20 1437      PT LONG TERM GOAL #1   Title  Patient will report overall improvement in symptoms of at least 50% for improved QOL.    Time  4    Period  Weeks    Status  New    Target Date  04/05/20      PT LONG TERM GOAL #2   Title  Patient will demonstrate an improvement of at least 3 points on the DGI indicating improved balance and safety.    Time  4    Period  Weeks    Status  New    Target Date  04/05/20      PT LONG TERM GOAL #3   Title  Patient will demonstrate ability to perform SLS on the RT LE for at least 8 seconds to improve ability to perform safe stair negotation.     Time  4    Period  Weeks    Status  New    Target Date  04/05/20             Plan - 03/08/20 1447    Clinical Impression Statement  Patient is a 64 year old male who presented to outpatient physical therapy with primary concern that he has had an increase in falls over the last several months and reported that his balance has decreased. Upon examination, noted deficits  while patient was performing the DGI as well as performing SLS. In addition, patient demonstrated decreased lower extremity strength in some muscle groups. Noted patient ambulating with gait deviations as well. Patient would benefit from skilled physical therapy in order to address the abovementioned deficits and help patient return to his PLOF.    Personal Factors and Comorbidities  Comorbidity 3+    Comorbidities  Anxiety, Depression, HTN; CAD - CABG surgery    Examination-Activity Limitations  Stand;Locomotion Level;Bend;Stairs    Examination-Participation Restrictions  Yard Work;Cleaning;Meal Prep;Community Activity;Laundry    Stability/Clinical Decision Making  Evolving/Moderate complexity    Clinical Decision Making  Moderate    Rehab Potential  Good    PT Frequency  2x / week    PT Duration  4 weeks    PT Treatment/Interventions  ADLs/Self Care Home Management;Aquatic Therapy;Cryotherapy;Electrical Stimulation;Moist Heat;DME Instruction;Gait training;Stair training;Functional mobility training;Therapeutic activities;Therapeutic exercise;Balance training;Neuromuscular re-education;Patient/family education;Orthotic Fit/Training;Manual techniques;Passive range of motion;Dry needling;Energy conservation;Splinting;Taping;Vestibular    PT Next Visit Plan  Review eval goals and initial HEP. Progress HEP focusing on hip strengthening. Dynamic balance exercises.    PT Home Exercise Plan  03/08/20: Seated marching    Consulted and Agree with Plan of Care  Patient       Patient will benefit from skilled therapeutic  intervention in order to improve the following deficits and impairments:  Abnormal gait, Pain, Improper body mechanics, Decreased coordination, Decreased activity tolerance, Decreased strength, Decreased balance, Difficulty walking, Obesity  Visit Diagnosis: Other abnormalities of gait and mobility  Muscle weakness (generalized)  History of falling     Problem List Patient Active Problem List   Diagnosis Date Noted  . 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 Vitreous hemorrhage, left eye ([place]) 03/13/2015  . Epiretinal membrane, left eye 03/13/2015  . Coronary atherosclerosis of native coronary artery 01/07/2013  . TOBACCO ABUSE 10/15/2009  . HYPERLIPIDEMIA 10/11/2009  . CHRONIC PAIN SYNDROME 10/11/2009  . Essential hypertension, benign 10/11/2009   Clarene Critchley PT, DPT 2:53 PM, 03/08/20 [facility] [place], [place], [address] Phone: [phone]   Fax:  [phone]  Name: Brandon Pratt MRN: [id number] Date of Birth: 1956-06-01

## 2020-03-08 NOTE — Patient Instructions (Addendum)
Hip (Front)    Begin sitting tall, both feet flat on floor. Inhale, then exhale while lifting knee as high as is comfortable, keeping upper body straight and still. Slowly return to starting position. Repeat __20__ times alternating legs. Do _1___ sets per session. Do __1-2__ sessions per day.  Copyright  VHI. All rights reserved.

## 2020-03-13 ENCOUNTER — Ambulatory Visit (HOSPITAL_COMMUNITY): Payer: Medicare Other | Admitting: Physical Therapy

## 2020-03-15 ENCOUNTER — Ambulatory Visit (HOSPITAL_COMMUNITY): Payer: Medicare Other | Admitting: Physical Therapy

## 2020-03-15 ENCOUNTER — Encounter (HOSPITAL_COMMUNITY): Payer: Self-pay | Admitting: Physical Therapy

## 2020-03-15 ENCOUNTER — Other Ambulatory Visit: Payer: Self-pay

## 2020-03-15 DIAGNOSIS — R2689 Other abnormalities of gait and mobility: Secondary | ICD-10-CM

## 2020-03-15 DIAGNOSIS — Z9181 History of falling: Secondary | ICD-10-CM | POA: Diagnosis not present

## 2020-03-15 DIAGNOSIS — M6281 Muscle weakness (generalized): Secondary | ICD-10-CM | POA: Diagnosis not present

## 2020-03-15 NOTE — Therapy (Signed)
[facility] [place], [place], [address] Phone: [phone]   Fax:  [phone]  Physical Therapy Treatment  Patient Details  Name: Brandon Pratt MRN: [id number] Date of Birth: 07-Jun-1956 Referring Provider (PT): Edwinna Areola. [place] Crane   Encounter Date: 03/15/2020  PT End of Session - 03/15/20 1601    Visit Number  2    Number of Visits  8    Date for PT Re-Evaluation  04/05/20    Authorization Type  [employer] Medicare    Authorization Time Period  03/08/20 - 04/05/20    Progress Note Due on Visit  8    PT Start Time  1435    PT Stop Time  1520    PT Time Calculation (min)  45 min    Equipment Utilized During Treatment  Gait belt    Activity Tolerance  Patient tolerated treatment well    Behavior During Therapy  WFL for tasks assessed/performed       Past Medical History:  Diagnosis Date  . Anxiety   . Arthritis   . Chronic pain syndrome   . Coronary atherosclerosis of native coronary artery    Multivessel, occluded SVG to RCA 2/14 - managed medically  . Depression   . Essential hypertension   . GERD (gastroesophageal reflux disease)   . History of bronchitis 20+ yrs ago  . History of hiatal hernia   . Joint pain   . Mixed hyperlipidemia     Past Surgical History:  Procedure Laterality Date  . APPENDECTOMY    . CARDIAC CATHETERIZATION  2014  . CARPAL TUNNEL RELEASE Left   . CERVICAL SPINE SURGERY    . COLONOSCOPY    . CORONARY ARTERY BYPASS GRAFT  12/2009   LIMA to LAD, SVG to OM, SVG to diagonal, SVG to RCA  . ELBOW SURGERY     "moved nerves over"  . GAS/FLUID EXCHANGE Left 03/13/2015   Procedure: GAS/FLUID EXCHANGE;  Surgeon: Hayden Pedro, MD;  Location: [facility];  Service: Ophthalmology;  Laterality: Left;  Marland Kitchen GAS/FLUID EXCHANGE Left 04/08/2016   Procedure: GAS/FLUID EXCHANGE;  Surgeon: Hayden Pedro, MD;  Location: [facility];  Service: Ophthalmology;  Laterality: Left;  C3F8  . HAND SURGERY Right    carpatal tunnel x2  .  LASER PHOTO ABLATION Left 03/13/2015   Procedure: LASER PHOTO ABLATION;  Surgeon: Hayden Pedro, MD;  Location: [facility];  Service: Ophthalmology;  Laterality: Left;  Endolaser  . LASER PHOTO ABLATION Left 04/08/2016   Procedure: LASER PHOTO ABLATION;  Surgeon: Hayden Pedro, MD;  Location: [facility];  Service: Ophthalmology;  Laterality: Left;  Headscope laser  . LEFT SHOULDER SURGERY    . MEMBRANE PEEL Left 03/13/2015   Procedure: MEMBRANE PEEL;  Surgeon: Hayden Pedro, MD;  Location: [facility];  Service: Ophthalmology;  Laterality: Left;  . PARS PLANA VITRECTOMY Left 03/13/2015   Procedure: PARS PLANA VITRECTOMY WITH 25 GAUGE;  Surgeon: Hayden Pedro, MD;  Location: [facility];  Service: Ophthalmology;  Laterality: Left;  . RIGHT SHOULDER SURGERY    . SCLERAL BUCKLE Left 04/08/2016   Procedure: SCLERAL BUCKLE;  Surgeon: Hayden Pedro, MD;  Location: [facility];  Service: Ophthalmology;  Laterality: Left;    There were no vitals filed for this visit.  Subjective Assessment - 03/15/20 1556    Subjective  Patient reported that he's had some stomach issues recently, but has not had any sickness in the last 24 hours.  Patient Stated Goals  To have better balance    Currently in Pain?  No/denies                       [facility] Adult PT Treatment/Exercise - 03/15/20 0001      Ambulation/Gait   Ambulation/Gait  Yes    Ambulation Distance (Feet)  226 Feet    Assistive device  None    Gait Pattern  Poor foot clearance - left;Poor foot clearance - right;Wide base of support    Gait Comments  Increased challenge with cues to look to read signs or describe what was in a picture. Patient pausing intermittently while ambulating to perform requested tasks.       Exercises   Exercises  Knee/Hip      Knee/Hip Exercises: Standing   Heel Raises  Both;10 reps;2 sets    Heel Raises Limitations  Toe raises 2x10    Lateral Step Up  Right;Left;1 set;10 reps;Step Height: 6";Hand Hold: 2    Lateral Step Up  Limitations  Noted catching of foot on edge of step intermittently. Cues for improved eccentric control    Forward Step Up  Right;Left;1 set;10 reps;Step Height: 6";Hand Hold: 2    Forward Step Up Limitations  Noted catching of foot on edge of step intermittently. Cues for improved eccentric control    SLS with Vectors  3'' forward, side and back 5x each LE with UE support intermittently.     Other Standing Knee Exercises  Tandem ambulation intermittent semi-tandem position 15'x2 with CGA and intermittent min A.  15' stepping over (4) 6-inch hurdles x2 RT. Vertical head turns 15' x 4    Other Standing Knee Exercises  Semi-tandem stance each LE forward 2x 30''       Knee/Hip Exercises: Seated   Marching  20 reps;Strengthening;Both    Marching Limitations  2'' holds cues to not lean with trunk               PT Short Term Goals - 03/15/20 1438      PT SHORT TERM GOAL #1   Title  Patient will report understanding and regular compliance of HEP to improve strength, balance, and overall functional mobility.    Time  2    Period  Weeks    Status  On-going    Target Date  03/22/20      PT SHORT TERM GOAL #2   Title  Patient will report overall improvement in symptoms of at least 25% for improved QOL.    Time  2    Period  Weeks    Status  On-going    Target Date  03/22/20        PT Long Term Goals - 03/15/20 1439      PT LONG TERM GOAL #1   Title  Patient will report overall improvement in symptoms of at least 50% for improved QOL.    Time  4    Period  Weeks    Status  On-going      PT LONG TERM GOAL #2   Title  Patient will demonstrate an improvement of at least 3 points on the DGI indicating improved balance and safety.    Time  4    Period  Weeks    Status  On-going      PT LONG TERM GOAL #3   Title  Patient will demonstrate ability to perform SLS on the RT LE for at least 8  seconds to improve ability to perform safe stair negotation.    Time  4    Period  Weeks     Status  On-going            Plan - 03/15/20 1602    Clinical Impression Statement  Began by reviewing patient's evaluation and goals which patient reported sounded good. Patient requested to not wear the gait belt due to having an upset stomach the past couple days, but discussed with the patient the importance of the gait belt for safety and that it could be worn under patient's arms and patient consented to wear it and denied any issues with this throughout session. Began with a focus on dynamic balance including tandem ambulation, gait with head turns, and gait stepping over hurdles. With step ups, noted patient would catch his foot on the side of the step when stepping down at times as well as noted decreased eccentric control. Patient would benefit from focus on eccentric control in upcoming sessions to improve balance and safety with steps.    Personal Factors and Comorbidities  Comorbidity 3+    Comorbidities  Anxiety, Depression, HTN; CAD - CABG surgery    Examination-Activity Limitations  Stand;Locomotion Level;Bend;Stairs    Examination-Participation Restrictions  Yard Work;Cleaning;Meal Prep;Community Activity;Laundry    Stability/Clinical Decision Making  Evolving/Moderate complexity    Rehab Potential  Good    PT Frequency  2x / week    PT Duration  4 weeks    PT Treatment/Interventions  ADLs/Self Care Home Management;Aquatic Therapy;Cryotherapy;Electrical Stimulation;Moist Heat;DME Instruction;Gait training;Stair training;Functional mobility training;Therapeutic activities;Therapeutic exercise;Balance training;Neuromuscular re-education;Patient/family education;Orthotic Fit/Training;Manual techniques;Passive range of motion;Dry needling;Energy conservation;Splinting;Taping;Vestibular    PT Next Visit Plan  Focus on eccentric strengthening including step-ups, sit to stands. Continue with dynamic balance exercises.    PT Home Exercise Plan  03/08/20: Seated marching    Consulted  and Agree with Plan of Care  Patient       Patient will benefit from skilled therapeutic intervention in order to improve the following deficits and impairments:  Abnormal gait, Pain, Improper body mechanics, Decreased coordination, Decreased activity tolerance, Decreased strength, Decreased balance, Difficulty walking, Obesity  Visit Diagnosis: Other abnormalities of gait and mobility  Muscle weakness (generalized)  History of falling     Problem List Patient Active Problem List   Diagnosis Date Noted  . 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 Vitreous hemorrhage, left eye ([place]) 03/13/2015  . Epiretinal membrane, left eye 03/13/2015  . Coronary atherosclerosis of native coronary artery 01/07/2013  . TOBACCO ABUSE 10/15/2009  . HYPERLIPIDEMIA 10/11/2009  . CHRONIC PAIN SYNDROME 10/11/2009  . Essential hypertension, benign 10/11/2009   Clarene Critchley PT, DPT 4:04 PM, 03/15/20 [facility] [place], [place], [address] Phone: [phone]   Fax:  [phone]  Name: Brandon Pratt MRN: [id number] Date of Birth: 11/12/1956

## 2020-03-20 ENCOUNTER — Telehealth (HOSPITAL_COMMUNITY): Payer: Self-pay | Admitting: Physical Therapy

## 2020-03-20 ENCOUNTER — Ambulatory Visit (HOSPITAL_COMMUNITY): Payer: Medicare Other | Admitting: Physical Therapy

## 2020-03-20 NOTE — Telephone Encounter (Signed)
cx today's visit due to the pt had a covid shot done today.

## 2020-03-22 ENCOUNTER — Telehealth (HOSPITAL_COMMUNITY): Payer: Self-pay | Admitting: Physical Therapy

## 2020-03-22 ENCOUNTER — Ambulatory Visit (HOSPITAL_COMMUNITY): Payer: Medicare Other | Admitting: Physical Therapy

## 2020-03-22 NOTE — Telephone Encounter (Signed)
Pt cx on phone tree - called to confirm-Pt requested to be D/c no reason given -he did say it was nothing that we did, he just doesn't want to continue PT.

## 2020-03-27 ENCOUNTER — Ambulatory Visit (HOSPITAL_COMMUNITY): Payer: Medicare Other | Admitting: Physical Therapy

## 2020-03-29 ENCOUNTER — Encounter (HOSPITAL_COMMUNITY): Payer: Medicare Other | Admitting: Physical Therapy

## 2020-03-30 ENCOUNTER — Encounter (HOSPITAL_COMMUNITY): Payer: Self-pay | Admitting: Physical Therapy

## 2020-03-30 NOTE — Therapy (Signed)
[facility] [place], [place], [address] Phone: [phone]   Fax:  [phone]  Patient Details  Name: Brandon Pratt MRN: [id number] Date of Birth: 07-01-1956 Referring Provider:  No ref. provider found  Encounter Date: 03/30/2020   PHYSICAL THERAPY DISCHARGE SUMMARY  Visits from Start of Care: 2  Current functional level related to goals / functional outcomes: Unknown as patient did not return for re-assessment   Remaining deficits: Unknown as patient did not return for re-assessment      Education / Equipment: HEP Plan: Patient agrees to discharge.  Patient goals were not met. Patient is being discharged due to the patient's request.  ?????     Clarene Critchley PT, DPT 3:44 PM, 03/30/20 [facility] [place], [place], [address] Phone: [phone]   Fax:  [phone]

## 2020-04-03 ENCOUNTER — Encounter (HOSPITAL_COMMUNITY): Payer: Medicare Other | Admitting: Physical Therapy

## 2020-04-05 ENCOUNTER — Encounter (HOSPITAL_COMMUNITY): Payer: Medicare Other | Admitting: Physical Therapy

## 2020-04-19 DIAGNOSIS — R7301 Impaired fasting glucose: Secondary | ICD-10-CM | POA: Diagnosis not present

## 2020-04-19 DIAGNOSIS — D72829 Elevated white blood cell count, unspecified: Secondary | ICD-10-CM | POA: Diagnosis not present

## 2020-04-19 DIAGNOSIS — E782 Mixed hyperlipidemia: Secondary | ICD-10-CM | POA: Diagnosis not present

## 2020-04-24 DIAGNOSIS — E782 Mixed hyperlipidemia: Secondary | ICD-10-CM | POA: Diagnosis not present

## 2020-04-24 DIAGNOSIS — K219 Gastro-esophageal reflux disease without esophagitis: Secondary | ICD-10-CM | POA: Diagnosis not present

## 2020-04-24 DIAGNOSIS — I10 Essential (primary) hypertension: Secondary | ICD-10-CM | POA: Diagnosis not present

## 2020-04-24 DIAGNOSIS — Z0001 Encounter for general adult medical examination with abnormal findings: Secondary | ICD-10-CM | POA: Diagnosis not present

## 2020-04-24 DIAGNOSIS — I2510 Atherosclerotic heart disease of native coronary artery without angina pectoris: Secondary | ICD-10-CM | POA: Diagnosis not present

## 2020-04-26 ENCOUNTER — Other Ambulatory Visit (HOSPITAL_COMMUNITY): Payer: Self-pay | Admitting: Internal Medicine

## 2020-04-26 ENCOUNTER — Other Ambulatory Visit: Payer: Self-pay | Admitting: Internal Medicine

## 2020-04-26 DIAGNOSIS — Z87891 Personal history of nicotine dependence: Secondary | ICD-10-CM

## 2020-04-26 DIAGNOSIS — Z8249 Family history of ischemic heart disease and other diseases of the circulatory system: Secondary | ICD-10-CM

## 2020-05-03 ENCOUNTER — Other Ambulatory Visit: Payer: Self-pay

## 2020-05-03 ENCOUNTER — Ambulatory Visit (HOSPITAL_COMMUNITY)
Admission: RE | Admit: 2020-05-03 | Discharge: 2020-05-03 | Disposition: A | Discharge status: Home / Self Care | Payer: Medicare Other | Source: Ambulatory Visit | Attending: Internal Medicine | Admitting: Internal Medicine

## 2020-05-03 DIAGNOSIS — I714 Abdominal aortic aneurysm, without rupture: Secondary | ICD-10-CM | POA: Diagnosis not present

## 2020-05-03 DIAGNOSIS — Z8249 Family history of ischemic heart disease and other diseases of the circulatory system: Secondary | ICD-10-CM | POA: Diagnosis not present

## 2020-05-03 DIAGNOSIS — Z87891 Personal history of nicotine dependence: Secondary | ICD-10-CM | POA: Insufficient documentation

## 2020-05-03 DIAGNOSIS — Z136 Encounter for screening for cardiovascular disorders: Secondary | ICD-10-CM | POA: Insufficient documentation

## 2020-05-14 ENCOUNTER — Telehealth: Payer: Self-pay | Admitting: Cardiology

## 2020-05-14 NOTE — Telephone Encounter (Signed)
Please give pt a call to discuss findings from [place] - would like to know what he should do.  [phone]

## 2020-05-14 NOTE — Telephone Encounter (Signed)
Advised patient that screening abdominal/aorta US ordered by PCP is available for provider to review upon return to office next week. Verbalized understanding.

## 2020-05-14 NOTE — Telephone Encounter (Signed)
    Covering for Dr. Domenic Polite - Please let the patient know his ultrasound did show an abdominal aortic aneurysm measuring 3.7 cm. This is something that can remain stable for the rest of his life but something we routinely monitor to make sure it is not increasing to the size of requiring intervention. Repeat ultrasound was recommended in 2 years and this can be arranged by [place] or his PCP at that time.   Signed, Erma Heritage, PA-C 05/14/2020, 4:27 PM Pager: [phone]

## 2020-05-15 NOTE — Telephone Encounter (Signed)
Patient informed and verbalized understanding of plan. 

## 2020-07-30 DIAGNOSIS — I10 Essential (primary) hypertension: Secondary | ICD-10-CM | POA: Diagnosis not present

## 2020-07-30 DIAGNOSIS — I2510 Atherosclerotic heart disease of native coronary artery without angina pectoris: Secondary | ICD-10-CM | POA: Diagnosis not present

## 2020-07-30 DIAGNOSIS — F17219 Nicotine dependence, cigarettes, with unspecified nicotine-induced disorders: Secondary | ICD-10-CM | POA: Diagnosis not present

## 2020-07-30 DIAGNOSIS — E782 Mixed hyperlipidemia: Secondary | ICD-10-CM | POA: Diagnosis not present

## 2020-07-31 DIAGNOSIS — H2511 Age-related nuclear cataract, right eye: Secondary | ICD-10-CM | POA: Diagnosis not present

## 2020-07-31 DIAGNOSIS — Z961 Presence of intraocular lens: Secondary | ICD-10-CM | POA: Diagnosis not present

## 2020-07-31 DIAGNOSIS — H524 Presbyopia: Secondary | ICD-10-CM | POA: Diagnosis not present

## 2020-07-31 DIAGNOSIS — H1045 Other chronic allergic conjunctivitis: Secondary | ICD-10-CM | POA: Diagnosis not present

## 2020-10-23 DIAGNOSIS — R7301 Impaired fasting glucose: Secondary | ICD-10-CM | POA: Diagnosis not present

## 2020-10-23 DIAGNOSIS — I209 Angina pectoris, unspecified: Secondary | ICD-10-CM | POA: Diagnosis not present

## 2020-10-23 DIAGNOSIS — Z23 Encounter for immunization: Secondary | ICD-10-CM | POA: Diagnosis not present

## 2020-10-29 DIAGNOSIS — E782 Mixed hyperlipidemia: Secondary | ICD-10-CM | POA: Diagnosis not present

## 2020-10-29 DIAGNOSIS — I10 Essential (primary) hypertension: Secondary | ICD-10-CM | POA: Diagnosis not present

## 2020-10-29 DIAGNOSIS — F17219 Nicotine dependence, cigarettes, with unspecified nicotine-induced disorders: Secondary | ICD-10-CM | POA: Diagnosis not present

## 2020-10-29 DIAGNOSIS — I2510 Atherosclerotic heart disease of native coronary artery without angina pectoris: Secondary | ICD-10-CM | POA: Diagnosis not present

## 2020-12-10 DIAGNOSIS — M542 Cervicalgia: Secondary | ICD-10-CM | POA: Diagnosis not present

## 2020-12-11 ENCOUNTER — Other Ambulatory Visit (HOSPITAL_COMMUNITY): Payer: Self-pay | Admitting: Internal Medicine

## 2020-12-11 DIAGNOSIS — M792 Neuralgia and neuritis, unspecified: Secondary | ICD-10-CM

## 2020-12-24 ENCOUNTER — Other Ambulatory Visit: Payer: Self-pay

## 2020-12-24 ENCOUNTER — Ambulatory Visit (HOSPITAL_COMMUNITY)
Admission: RE | Admit: 2020-12-24 | Discharge: 2020-12-24 | Disposition: A | Discharge status: Home / Self Care | Payer: Medicare Other | Source: Ambulatory Visit | Attending: Internal Medicine | Admitting: Internal Medicine

## 2020-12-24 DIAGNOSIS — M792 Neuralgia and neuritis, unspecified: Secondary | ICD-10-CM | POA: Diagnosis not present

## 2020-12-24 DIAGNOSIS — M542 Cervicalgia: Secondary | ICD-10-CM | POA: Diagnosis not present

## 2021-01-10 IMAGING — US US ABDOMINAL AORTA SCREENING AAA
1 series · 14 of 25 positions shown · non-contrast
Comparison: None.

CLINICAL DATA: Male between 65-75 years of age with a smoking
history. Positive family history of abdominal aortic aneurysm.

EXAM:
US ABDOMINAL AORTA MEDICARE SCREENING
TECHNIQUE: Ultrasound examination of the abdominal aorta was performed as a
screening evaluation for abdominal aortic aneurysm.

[Series 1: us aorta medicare screening · 14 of 38 slices shown]
[im 1/38]
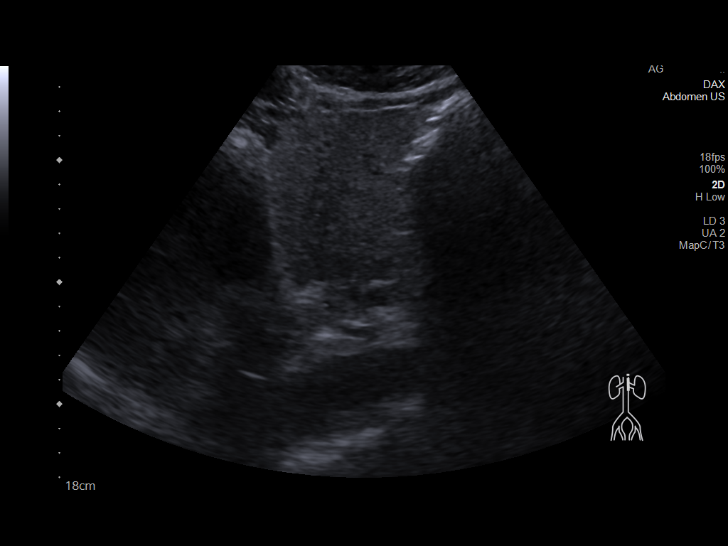
[im 4/38]
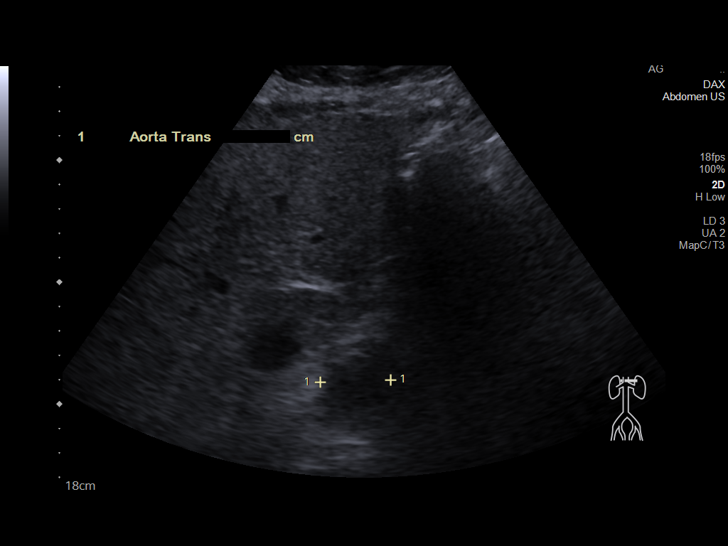
[im 7/38]
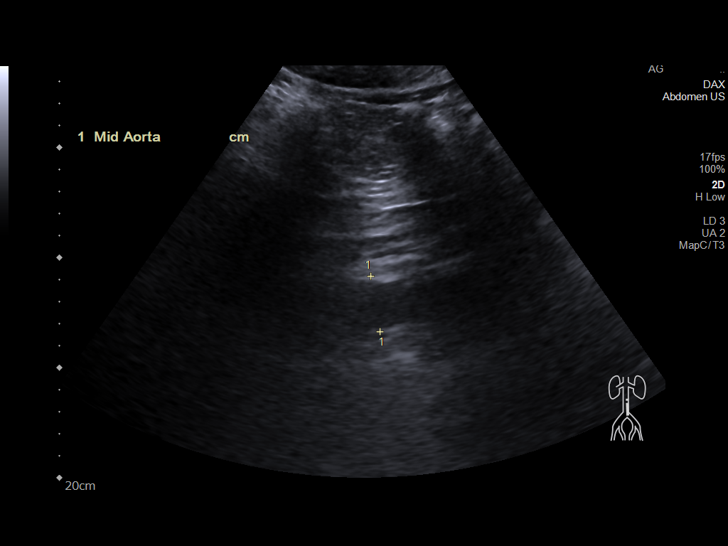
[im 10/38]
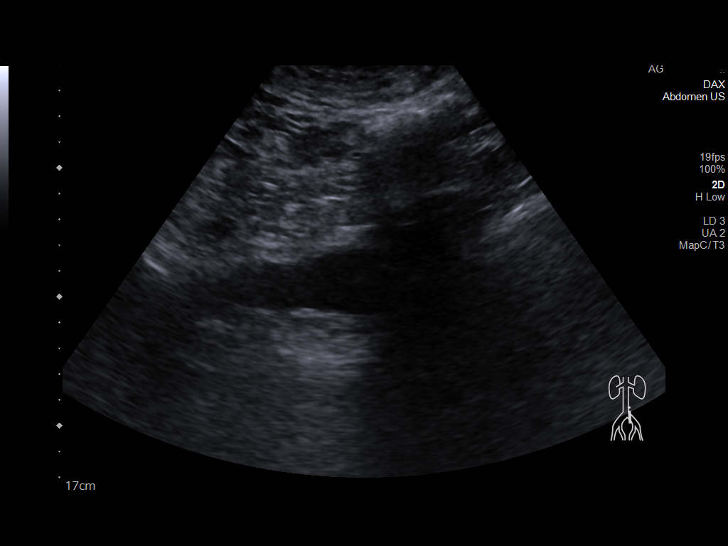
[im 13/38]
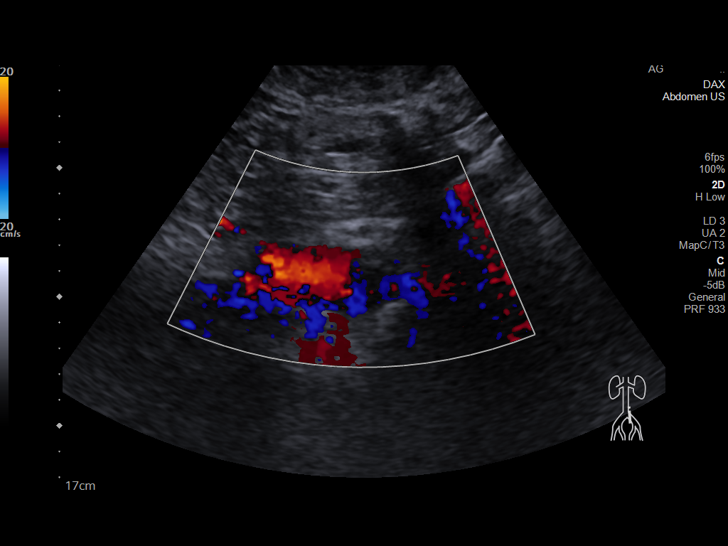
[im 14/38]
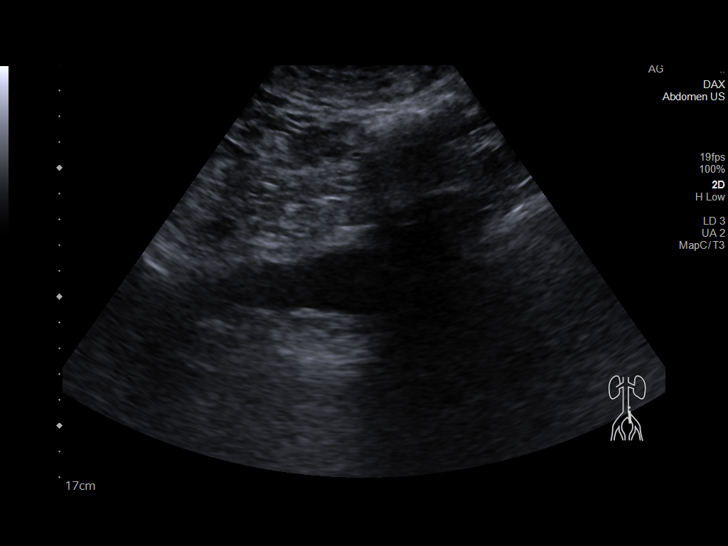
[im 17/38]
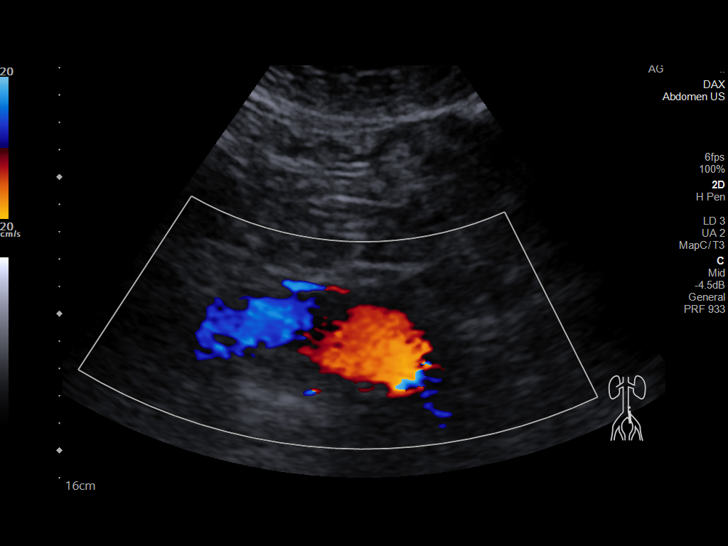
[im 21/38]
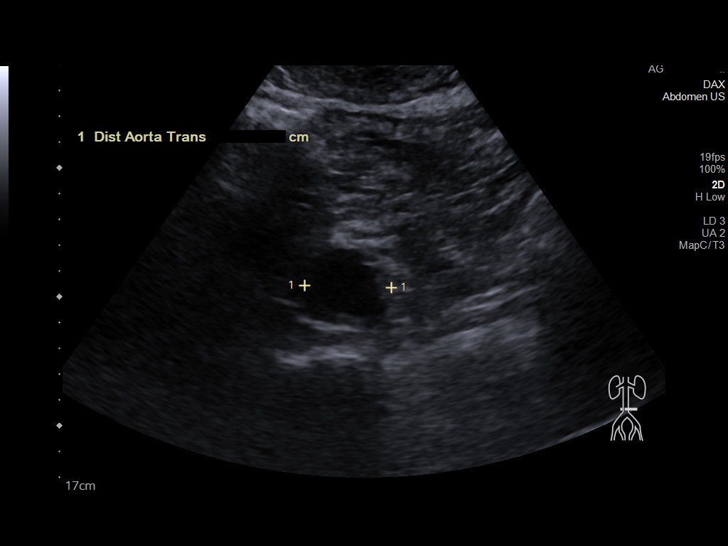
[im 24/38]
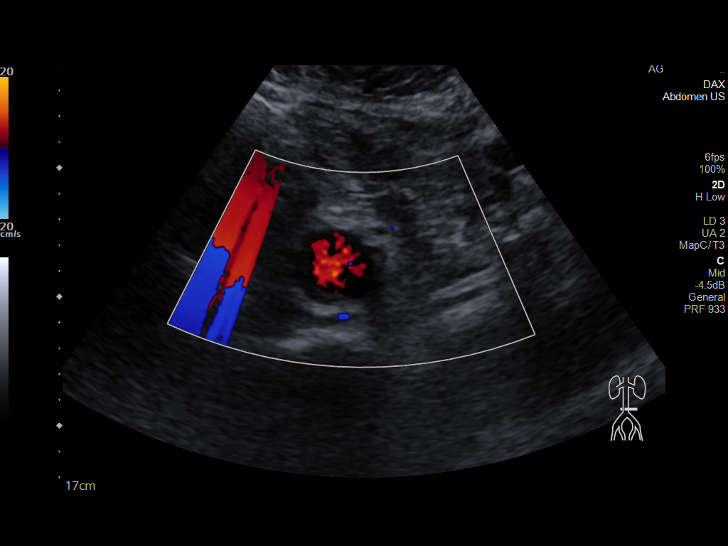
[im 25/38]
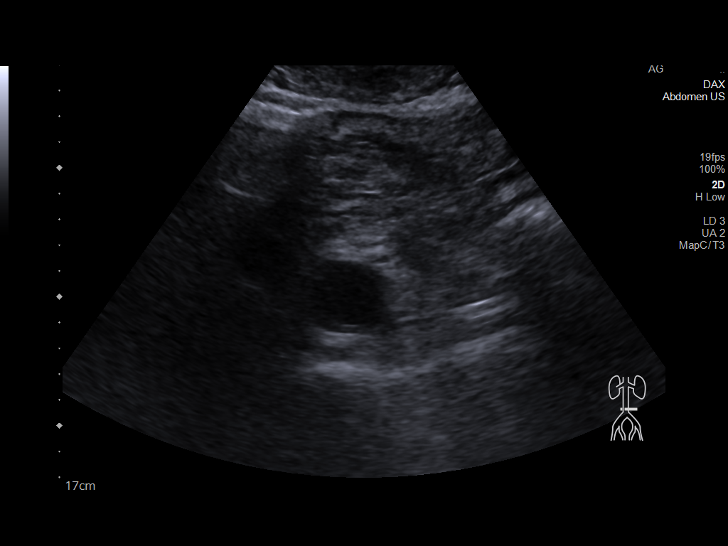
[im 28/38]
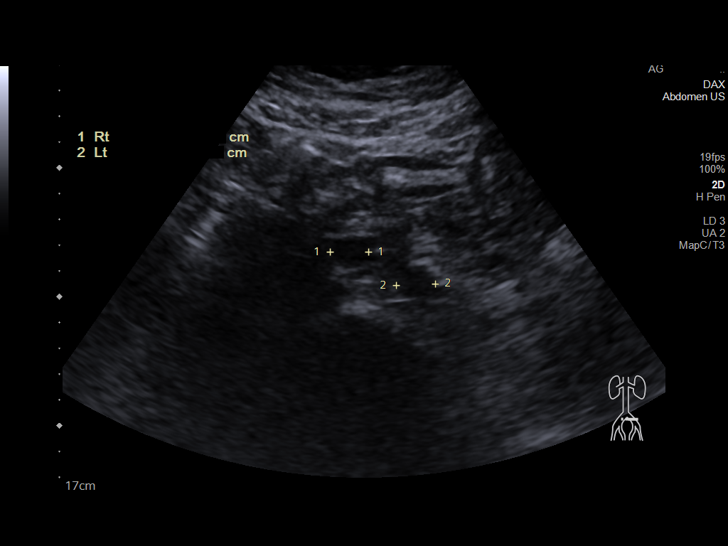
[im 31/38]
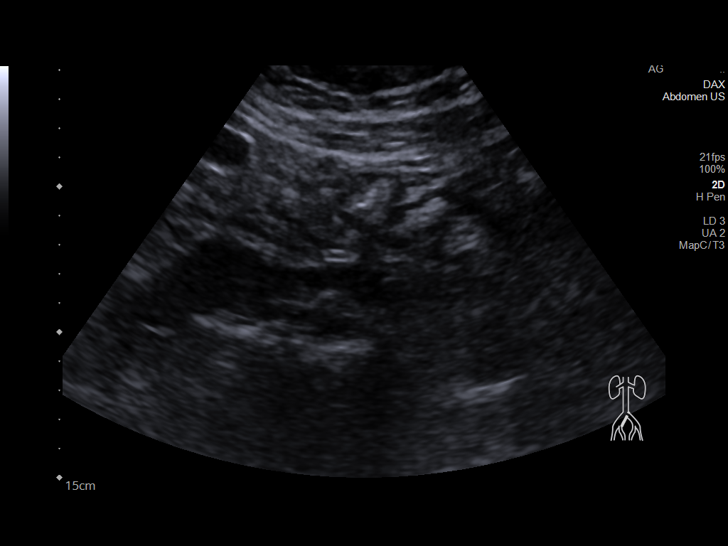
[im 34/38]
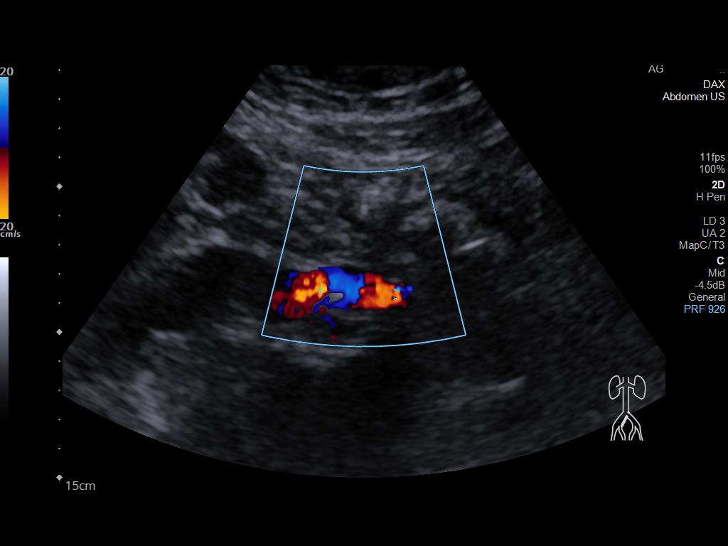
[im 38/38]
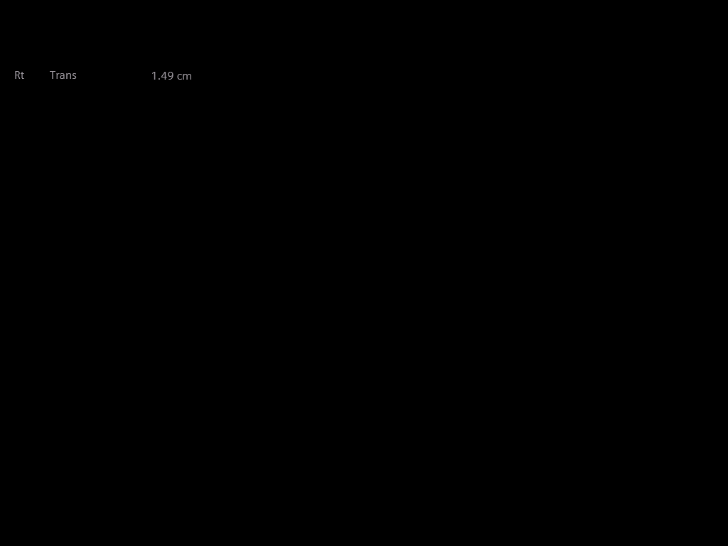

[14 of 25 positions shown; findings below may reference images not displayed]

FINDINGS: Abdominal aortic measurements as follows:

Proximal:  2.5 cm

Mid:  2.6 cm

Distal:  3.7 cm
IMPRESSION: Fusiform distal abdominal aortic aneurysm measuring 3.7 cm.
Recommend followup by ultrasound in 2 years. This recommendation
follows ACR consensus guidelines: White Paper of the ACR Incidental

## 2021-02-04 DIAGNOSIS — I6523 Occlusion and stenosis of bilateral carotid arteries: Secondary | ICD-10-CM | POA: Diagnosis not present

## 2021-02-04 DIAGNOSIS — I10 Essential (primary) hypertension: Secondary | ICD-10-CM | POA: Diagnosis not present

## 2021-02-04 DIAGNOSIS — E782 Mixed hyperlipidemia: Secondary | ICD-10-CM | POA: Diagnosis not present

## 2021-02-04 DIAGNOSIS — I2510 Atherosclerotic heart disease of native coronary artery without angina pectoris: Secondary | ICD-10-CM | POA: Diagnosis not present

## 2021-02-04 DIAGNOSIS — I209 Angina pectoris, unspecified: Secondary | ICD-10-CM | POA: Diagnosis not present

## 2021-02-04 DIAGNOSIS — R7301 Impaired fasting glucose: Secondary | ICD-10-CM | POA: Diagnosis not present

## 2021-02-04 DIAGNOSIS — K219 Gastro-esophageal reflux disease without esophagitis: Secondary | ICD-10-CM | POA: Diagnosis not present

## 2021-02-04 DIAGNOSIS — F17219 Nicotine dependence, cigarettes, with unspecified nicotine-induced disorders: Secondary | ICD-10-CM | POA: Diagnosis not present

## 2021-02-04 DIAGNOSIS — M1990 Unspecified osteoarthritis, unspecified site: Secondary | ICD-10-CM | POA: Diagnosis not present

## 2021-02-04 DIAGNOSIS — J449 Chronic obstructive pulmonary disease, unspecified: Secondary | ICD-10-CM | POA: Diagnosis not present

## 2021-02-06 NOTE — Progress Notes (Signed)
Cardiology Office Note  Date: 02/07/2021   ID: Brandon Pratt, DOB 08/02/1956, MRN [id number]  PCP:  Celene Squibb, MD  Cardiologist:  Rozann Lesches, MD Electrophysiologist:  None   Chief Complaint: Cardiac follow up  History of Present Illness: Brandon Pratt is a 65 y.o. male with a history of CAD status post CABG, HLD, GERD, HTN.  History of multivessel CAD status post CABG with subsequently documented graft disease, SVG to RCA occluded with known occlusion of RCA associated with left to right collaterals.  Last seen by Dr. Domenic Polite via telemedicine visit 01/02/2020.  No recent anginal symptoms or nitroglycerin use.  Still attempting to cut back on smoking.  He was continuing aspirin, lisinopril, Pravachol,  and as needed nitroglycerin.  Follow-up carotid Dopplers were discussed.  History of longstanding tobacco abuse.  Continuing to address smoking cessation.  He stated he had cut back on his smoking.  He was continuing Pravachol with last LDL of 76. Repeat carotid study with bilateral 1-39% ICA stenosis March 2021.  He is here for 1 year follow-up today.  He denies any recent acute illnesses or hospitalizations in the interim.  He has had all of his Covid vaccinations.  He continues to smoke.  States he has been smoking for over 50 years.  He denies any anginal symptoms or nitroglycerin use.  No palpitations or arrhythmias, orthostatic symptoms, CVA or TIA-like symptoms, PND, orthopnea, bleeding, claudication-like symptoms, DVT or PE-like symptoms, or lower extremity edema.  Blood pressure elevated today on arrival.  Patient states at recent visit to Dr. Nevada Pratt his blood pressure was 130/80.  We will have nursing recheck before leaving today.  Past Medical History:  Diagnosis Date  . Anxiety   . Arthritis   . Chronic pain syndrome   . Coronary atherosclerosis of native coronary artery    Multivessel, occluded SVG to RCA 2/14 - managed medically  . Depression   . Essential hypertension    . GERD (gastroesophageal reflux disease)   . History of bronchitis 20+ yrs ago  . History of hiatal hernia   . Joint pain   . Mixed hyperlipidemia     Past Surgical History:  Procedure Laterality Date  . APPENDECTOMY    . CARDIAC CATHETERIZATION  2014  . CARPAL TUNNEL RELEASE Left   . CERVICAL SPINE SURGERY    . COLONOSCOPY    . CORONARY ARTERY BYPASS GRAFT  12/2009   LIMA to LAD, SVG to OM, SVG to diagonal, SVG to RCA  . ELBOW SURGERY     "moved nerves over"  . GAS/FLUID EXCHANGE Left 03/13/2015   Procedure: GAS/FLUID EXCHANGE;  Surgeon: Hayden Pedro, MD;  Location: [place];  Service: Ophthalmology;  Laterality: Left;  Marland Kitchen GAS/FLUID EXCHANGE Left 04/08/2016   Procedure: GAS/FLUID EXCHANGE;  Surgeon: Hayden Pedro, MD;  Location: [place];  Service: Ophthalmology;  Laterality: Left;  C3F8  . HAND SURGERY Right    carpatal tunnel x2  . LASER PHOTO ABLATION Left 03/13/2015   Procedure: LASER PHOTO ABLATION;  Surgeon: Hayden Pedro, MD;  Location: [place];  Service: Ophthalmology;  Laterality: Left;  Endolaser  . LASER PHOTO ABLATION Left 04/08/2016   Procedure: LASER PHOTO ABLATION;  Surgeon: Hayden Pedro, MD;  Location: [place];  Service: Ophthalmology;  Laterality: Left;  Headscope laser  . LEFT SHOULDER SURGERY    . MEMBRANE PEEL Left 03/13/2015   Procedure: MEMBRANE PEEL;  Surgeon: Hayden Pedro, MD;  Location: [facility]  OR;  Service: Ophthalmology;  Laterality: Left;  . PARS PLANA VITRECTOMY Left 03/13/2015   Procedure: PARS PLANA VITRECTOMY WITH 25 GAUGE;  Surgeon: Hayden Pedro, MD;  Location: [place];  Service: Ophthalmology;  Laterality: Left;  . RIGHT SHOULDER SURGERY    . SCLERAL BUCKLE Left 04/08/2016   Procedure: SCLERAL BUCKLE;  Surgeon: Hayden Pedro, MD;  Location: [place];  Service: Ophthalmology;  Laterality: Left;    Current Outpatient Medications  Medication Sig Dispense Refill  . albuterol (PROVENTIL) (2.5 MG/3ML) 0.083% nebulizer solution as needed.    Jearl Klinefelter ELLIPTA  62.5-25 MCG/INH AEPB Inhale 1 puff into the lungs daily.    Marland Kitchen aspirin EC 81 MG tablet Take 1 tablet (81 mg total) by mouth daily.    Marland Kitchen BEVESPI AEROSPHERE 9-4.8 MCG/ACT AERO Inhale into the lungs.    Marland Kitchen lisinopril (PRINIVIL,ZESTRIL) 20 MG tablet Take 20 mg by mouth daily.     Marland Kitchen LORazepam (ATIVAN) 1 MG tablet Take 1 mg by mouth.    . Multiple Vitamin (MULTIVITAMIN) tablet Take 1 tablet by mouth daily.    . nitroGLYCERIN (NITROSTAT) 0.4 MG SL tablet Place 1 tablet (0.4 mg total) under the tongue every 5 (five) minutes as needed for chest pain. 25 tablet 3  . omeprazole (PRILOSEC) 20 MG capsule Take 20 mg by mouth every morning.     . pravastatin (PRAVACHOL) 40 MG tablet Take 40 mg by mouth every evening.     . tamsulosin (FLOMAX) 0.4 MG CAPS capsule Take 0.8 mg by mouth daily.     No current facility-administered medications for this visit.   Allergies:  Codeine   Social History: The patient  reports that he has been smoking cigarettes. He started smoking about 60 years ago. He has a 50.00 pack-year smoking history. He quit smokeless tobacco use about 52 years ago.  His smokeless tobacco use included chew. He reports current alcohol use. He reports current drug use. Drug: Marijuana.   Family History: The patient's family history includes Diabetes in his mother; Hypertension in his mother; Other in his mother; Stroke in his father.   ROS:  Please see the history of present illness. Otherwise, complete review of systems is positive for none.  All other systems are reviewed and negative.   Physical Exam: VS:  BP (!) 160/100   Pulse 72   Ht 5\' 10"  (1.778 m)   Wt 222 lb 3.2 oz (100.8 kg)   SpO2 99%   BMI 31.88 kg/m , BMI Body mass index is 31.88 kg/m.  Wt Readings from Last 3 Encounters:  02/07/21 222 lb 3.2 oz (100.8 kg)  01/02/20 213 lb (96.6 kg)  12/29/18 220 lb (99.8 kg)    General: Patient appears comfortable at rest. HEENT: Conjunctiva and lids normal, oropharynx clear with moist  mucosa. Neck: Supple, no elevated JVP or carotid bruits, no thyromegaly. Lungs: Clear to auscultation, nonlabored breathing at rest. Cardiac: Regular rate and rhythm, no S3 or significant systolic murmur, no pericardial rub. Abdomen: Soft, nontender, no hepatomegaly, bowel sounds present, no guarding or rebound. Extremities: No pitting edema, distal pulses 2+. Skin: Warm and dry. Musculoskeletal: No kyphosis. Neuropsychiatric: Alert and oriented x3, affect grossly appropriate.  ECG:  An ECG dated 02/07/2021 was personally reviewed today and demonstrated:  Normal sinus rhythm left axis deviation, RBBB.  Rate of 70 bpm  Recent Labwork: No results found for requested labs within last 8760 hours.  No results found for: CHOL, TRIG, HDL, CHOLHDL, VLDL, LDLCALC,  LDLDIRECT  Other Studies Reviewed Today:  Carotid artery duplex 02/15/2020  Right Carotid: Velocities in the right ICA are consistent with a 1-39% stenosis. Left Carotid: Velocities in the left ICA are consistent with a 1-39% stenosis. Vertebrals: Bilateral vertebral arteries demonstrate antegrade flow. Subclavians: Normal flow hemodynamics were seen in bilateral subclavian arteries.    Carotid Dopplers 01/07/2017: 1-39% bilateral ICA stenoses.  Cardiac catheterization February 2014: Native vessel CAD with patent LIMA to LAD, patent SVG to OM, patent SVG to diagonal, and occluded SVG to RCA, with a chronically occluded RCA associated with left to right collaterals.  Assessment and Plan:  1. Coronary artery disease involving native coronary artery of native heart with angina pectoris ([place])   2. Mixed hyperlipidemia   3. Essential hypertension, benign    1. Coronary artery disease involving native coronary artery of native heart with angina pectoris [facility]) Denies any anginal or exertional symptoms.  No nitroglycerin use.  Continue aspirin 81 mg daily.  Continue nitroglycerin sublingual as needed.  2. Mixed  hyperlipidemia Continue pravastatin 40 mg daily.Lipid panel May 2021 total cholesterol 115, triglycerides 150, HDL 31, LDL 58  3. Essential hypertension, benign Blood pressure elevated today on arrival at 160/100.  Patient states he had a recent visit with PCP Dr. Nevada Pratt and blood pressure was 130/80.  BP recheck today 164/108.  Increase lisinopril to 40 mg daily.  Get a basic metabolic panel and magnesium in 2 weeks.  Start checking your blood pressures daily after starting lisinopril and call us back in 2 weeks with a log of blood pressures.  We may need to adjust antihypertensive medications  4.  Smoking Patient states he continues to smoke.  States he has been smoking for over 50 years.  Highly advised cessation.   Medication Adjustments/Labs and Tests Ordered: Current medicines are reviewed at length with the patient today.  Concerns regarding medicines are outlined above.   Disposition: Follow-up with Dr. Domenic Polite or APP 1 year  Signed, Levell July, NP 02/07/2021 10:31 AM    [facility] at [place], [place], [place] [address] Phone: [phone]; Fax: [phone]

## 2021-02-07 ENCOUNTER — Ambulatory Visit: Payer: Medicare Other | Admitting: Family Medicine

## 2021-02-07 ENCOUNTER — Encounter: Payer: Self-pay | Admitting: Family Medicine

## 2021-02-07 VITALS — BP 164/108 | HR 70 | Ht 70.0 in | Wt 222.2 lb

## 2021-02-07 DIAGNOSIS — Z79899 Other long term (current) drug therapy: Secondary | ICD-10-CM | POA: Diagnosis not present

## 2021-02-07 DIAGNOSIS — I10 Essential (primary) hypertension: Secondary | ICD-10-CM | POA: Diagnosis not present

## 2021-02-07 DIAGNOSIS — F17200 Nicotine dependence, unspecified, uncomplicated: Secondary | ICD-10-CM

## 2021-02-07 DIAGNOSIS — I25119 Atherosclerotic heart disease of native coronary artery with unspecified angina pectoris: Secondary | ICD-10-CM | POA: Diagnosis not present

## 2021-02-07 DIAGNOSIS — E782 Mixed hyperlipidemia: Secondary | ICD-10-CM

## 2021-02-07 DIAGNOSIS — IMO0001 Reserved for inherently not codable concepts without codable children: Secondary | ICD-10-CM

## 2021-02-07 MED ORDER — LISINOPRIL 40 MG PO TABS
40.0000 mg | ORAL_TABLET | Freq: Every day | ORAL | 3 refills | Status: DC
Start: 2021-02-07 — End: 2024-06-27

## 2021-02-07 NOTE — Patient Instructions (Addendum)
Medication Instructions:   Your physician has recommended you make the following change in your medication:   Increase lisinopril to 40 mg by mouth daily  Continue other medications the same  Labwork:  Your physician recommends that you return for non-fasting lab work in: 2 weeks to check your BMET & Mg levels. This may be done at [facility])  Testing/Procedures:  none  Follow-Up:  Your physician recommends that you schedule a follow-up appointment in: 1 year. You will receive a reminder letter in the mail in about 10 months reminding you to call and schedule your appointment. If you don't receive this letter, please contact our office.  Any Other Special Instructions Will Be Listed Below (If Applicable). Your physician has requested that you regularly monitor and record your blood pressure readings at home daily for 2 weeks. Please use the same machine at the same time of day to check your readings and record them. Call office with your results.  If you need a refill on your cardiac medications before your next appointment, please call your pharmacy.

## 2021-02-25 ENCOUNTER — Ambulatory Visit: Payer: Self-pay | Admitting: Orthopedic Surgery

## 2021-02-25 DIAGNOSIS — M542 Cervicalgia: Secondary | ICD-10-CM | POA: Diagnosis not present

## 2021-03-01 DIAGNOSIS — M6281 Muscle weakness (generalized): Secondary | ICD-10-CM | POA: Diagnosis not present

## 2021-03-01 DIAGNOSIS — M542 Cervicalgia: Secondary | ICD-10-CM | POA: Diagnosis not present

## 2021-03-01 DIAGNOSIS — R2681 Unsteadiness on feet: Secondary | ICD-10-CM | POA: Diagnosis not present

## 2021-03-04 DIAGNOSIS — M6281 Muscle weakness (generalized): Secondary | ICD-10-CM | POA: Diagnosis not present

## 2021-03-04 DIAGNOSIS — R2681 Unsteadiness on feet: Secondary | ICD-10-CM | POA: Diagnosis not present

## 2021-03-04 DIAGNOSIS — M542 Cervicalgia: Secondary | ICD-10-CM | POA: Diagnosis not present

## 2021-03-07 DIAGNOSIS — M542 Cervicalgia: Secondary | ICD-10-CM | POA: Diagnosis not present

## 2021-03-07 DIAGNOSIS — M6281 Muscle weakness (generalized): Secondary | ICD-10-CM | POA: Diagnosis not present

## 2021-03-07 DIAGNOSIS — R2681 Unsteadiness on feet: Secondary | ICD-10-CM | POA: Diagnosis not present

## 2021-03-19 DIAGNOSIS — M6281 Muscle weakness (generalized): Secondary | ICD-10-CM | POA: Diagnosis not present

## 2021-03-19 DIAGNOSIS — R2681 Unsteadiness on feet: Secondary | ICD-10-CM | POA: Diagnosis not present

## 2021-03-19 DIAGNOSIS — M542 Cervicalgia: Secondary | ICD-10-CM | POA: Diagnosis not present

## 2021-03-21 DIAGNOSIS — M6281 Muscle weakness (generalized): Secondary | ICD-10-CM | POA: Diagnosis not present

## 2021-03-21 DIAGNOSIS — R2681 Unsteadiness on feet: Secondary | ICD-10-CM | POA: Diagnosis not present

## 2021-03-21 DIAGNOSIS — M542 Cervicalgia: Secondary | ICD-10-CM | POA: Diagnosis not present

## 2021-03-25 DIAGNOSIS — R2681 Unsteadiness on feet: Secondary | ICD-10-CM | POA: Diagnosis not present

## 2021-03-25 DIAGNOSIS — M6281 Muscle weakness (generalized): Secondary | ICD-10-CM | POA: Diagnosis not present

## 2021-03-25 DIAGNOSIS — M542 Cervicalgia: Secondary | ICD-10-CM | POA: Diagnosis not present

## 2021-03-27 DIAGNOSIS — M961 Postlaminectomy syndrome, not elsewhere classified: Secondary | ICD-10-CM | POA: Diagnosis not present

## 2021-03-29 DIAGNOSIS — M542 Cervicalgia: Secondary | ICD-10-CM | POA: Diagnosis not present

## 2021-03-29 DIAGNOSIS — R2681 Unsteadiness on feet: Secondary | ICD-10-CM | POA: Diagnosis not present

## 2021-03-29 DIAGNOSIS — M6281 Muscle weakness (generalized): Secondary | ICD-10-CM | POA: Diagnosis not present

## 2021-04-01 DIAGNOSIS — M542 Cervicalgia: Secondary | ICD-10-CM | POA: Diagnosis not present

## 2021-04-01 DIAGNOSIS — M6281 Muscle weakness (generalized): Secondary | ICD-10-CM | POA: Diagnosis not present

## 2021-04-01 DIAGNOSIS — R2681 Unsteadiness on feet: Secondary | ICD-10-CM | POA: Diagnosis not present

## 2021-04-02 DIAGNOSIS — I10 Essential (primary) hypertension: Secondary | ICD-10-CM | POA: Diagnosis not present

## 2021-04-02 DIAGNOSIS — Z79899 Other long term (current) drug therapy: Secondary | ICD-10-CM | POA: Diagnosis not present

## 2021-04-05 ENCOUNTER — Encounter: Payer: Self-pay | Admitting: *Deleted

## 2021-04-15 DIAGNOSIS — E782 Mixed hyperlipidemia: Secondary | ICD-10-CM | POA: Diagnosis not present

## 2021-04-22 DIAGNOSIS — E782 Mixed hyperlipidemia: Secondary | ICD-10-CM | POA: Diagnosis not present

## 2021-04-22 DIAGNOSIS — J449 Chronic obstructive pulmonary disease, unspecified: Secondary | ICD-10-CM | POA: Diagnosis not present

## 2021-04-22 DIAGNOSIS — R7301 Impaired fasting glucose: Secondary | ICD-10-CM | POA: Diagnosis not present

## 2021-04-22 DIAGNOSIS — I6529 Occlusion and stenosis of unspecified carotid artery: Secondary | ICD-10-CM | POA: Diagnosis not present

## 2021-04-22 DIAGNOSIS — F17200 Nicotine dependence, unspecified, uncomplicated: Secondary | ICD-10-CM | POA: Diagnosis not present

## 2021-04-22 DIAGNOSIS — K219 Gastro-esophageal reflux disease without esophagitis: Secondary | ICD-10-CM | POA: Diagnosis not present

## 2021-04-22 DIAGNOSIS — I10 Essential (primary) hypertension: Secondary | ICD-10-CM | POA: Diagnosis not present

## 2021-04-22 DIAGNOSIS — I2510 Atherosclerotic heart disease of native coronary artery without angina pectoris: Secondary | ICD-10-CM | POA: Diagnosis not present

## 2021-04-23 DIAGNOSIS — R2681 Unsteadiness on feet: Secondary | ICD-10-CM | POA: Diagnosis not present

## 2021-04-23 DIAGNOSIS — M6281 Muscle weakness (generalized): Secondary | ICD-10-CM | POA: Diagnosis not present

## 2021-04-23 DIAGNOSIS — M542 Cervicalgia: Secondary | ICD-10-CM | POA: Diagnosis not present

## 2021-04-30 ENCOUNTER — Telehealth: Payer: Self-pay | Admitting: *Deleted

## 2021-04-30 NOTE — Telephone Encounter (Signed)
Laurine Blazer, LPN  06/06/2375 2:83 PM EDT Back to Top     Notified, copy to pcp.    Laurine Blazer, LPN  1/51/7616 0:73 PM EDT      Left message to return call.

## 2021-04-30 NOTE — Telephone Encounter (Signed)
-----   Message from Verta Ellen., NP sent at 04/11/2021  1:33 PM EDT ----- Lab work looks great.  Keep up the good work

## 2021-05-10 DIAGNOSIS — I209 Angina pectoris, unspecified: Secondary | ICD-10-CM | POA: Diagnosis not present

## 2021-05-10 DIAGNOSIS — M25562 Pain in left knee: Secondary | ICD-10-CM | POA: Diagnosis not present

## 2021-05-10 DIAGNOSIS — Z23 Encounter for immunization: Secondary | ICD-10-CM | POA: Diagnosis not present

## 2021-07-23 DIAGNOSIS — Z0001 Encounter for general adult medical examination with abnormal findings: Secondary | ICD-10-CM | POA: Diagnosis not present

## 2021-09-02 IMAGING — MR MR CERVICAL SPINE W/O CM
5 series · 39 of 48 positions shown · non-contrast
Comparison: 02/24/2018 and prior.

CLINICAL DATA: Neck pain radiating to upper extremities.

EXAM:
MRI CERVICAL SPINE WITHOUT CONTRAST
TECHNIQUE: Multiplanar, multisequence MR imaging of the cervical spine was
performed. No intravenous contrast was administered.

[Series 5: T2 · sagittal · 3.0mm · 0.69mm/px · 6 of 15 slices shown (1 of 2)]
[im 1/15]
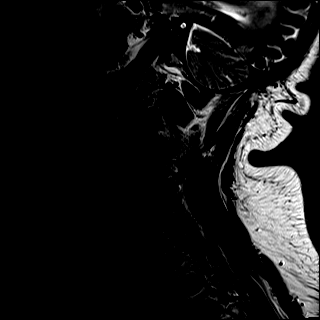
[im 3/15]
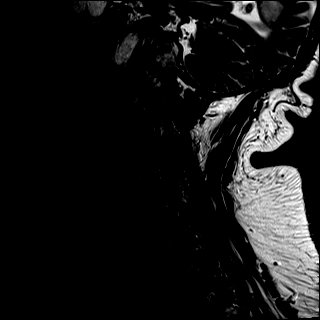
[im 6/15]
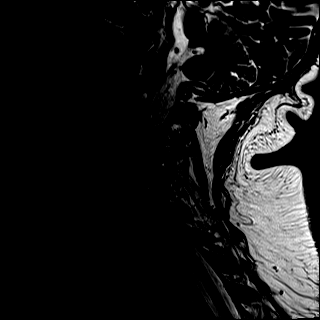
[im 9/15]
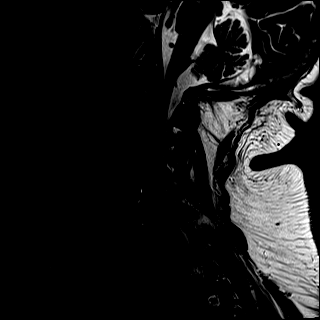
[im 12/15]
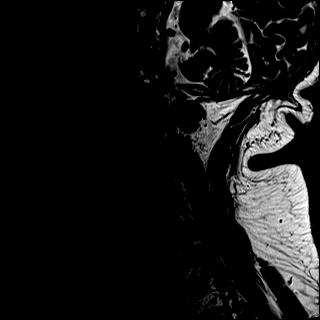
[im 15/15]
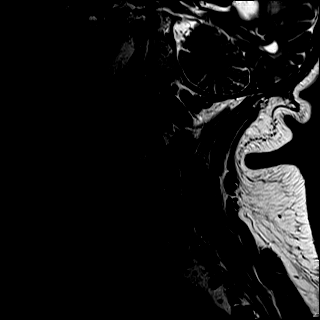

[Series 6: T1 · sagittal · 3.0mm · 0.86mm/px · 6 of 15 slices shown]
[im 1/15]
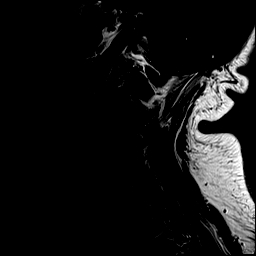
[im 3/15]
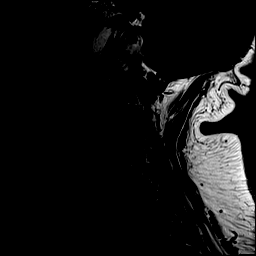
[im 6/15]
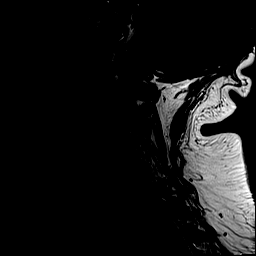
[im 9/15]
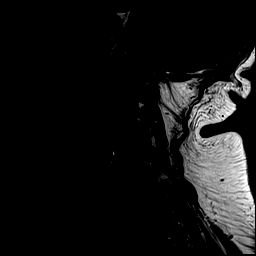
[im 12/15]
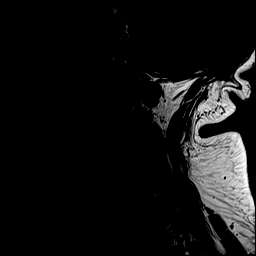
[im 15/15]
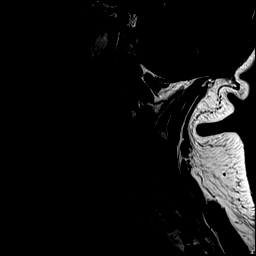

[Series 7: STIR · sagittal · 3.0mm · 0.69mm/px · 6 of 15 slices shown]
[im 1/15]
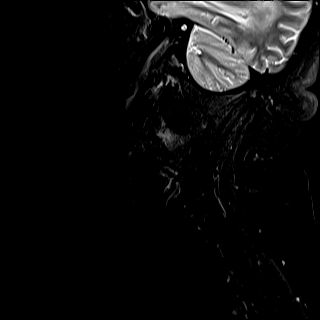
[im 3/15]
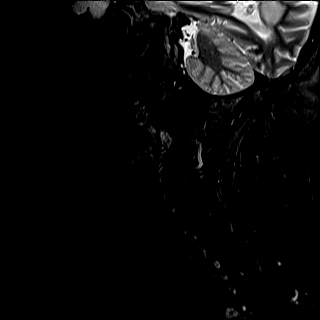
[im 6/15]
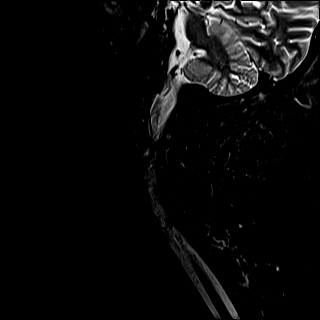
[im 9/15]
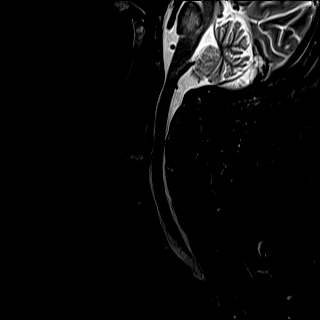
[im 12/15]
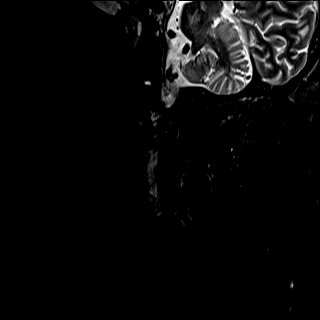
[im 15/15]
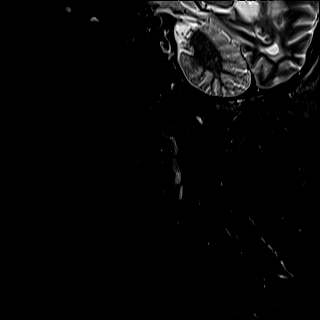

[Series 8: T2 · axial · 3.0mm · 0.70mm/px · z∈[-125,-8]mm · 13 of 36 slices shown (2 of 2)]
[im 1/36]
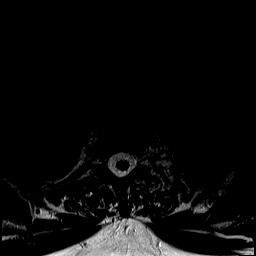
[im 3/36]
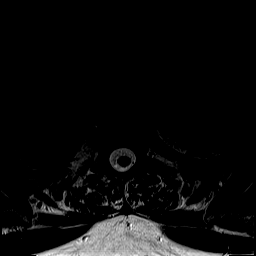
[im 6/36]
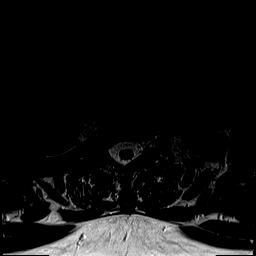
[im 8/36]
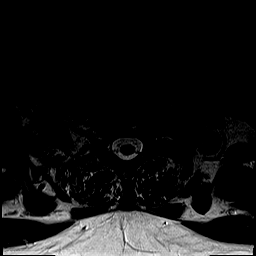
[im 11/36]
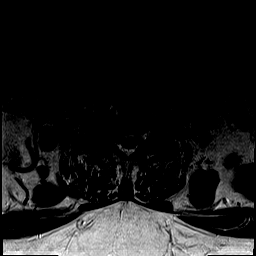
[im 13/36]
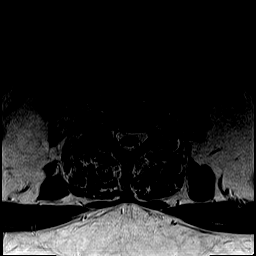
[im 16/36]
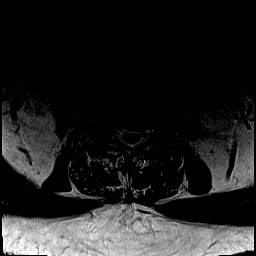
[im 18/36]
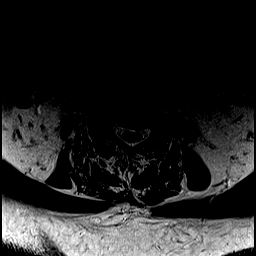
[im 21/36]
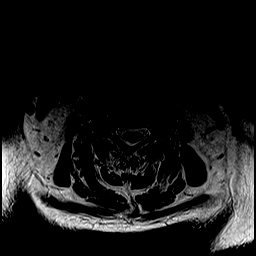
[im 23/36]
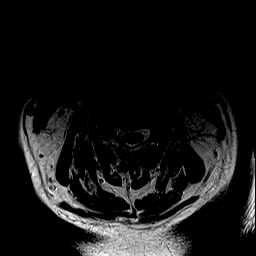
[im 26/36]
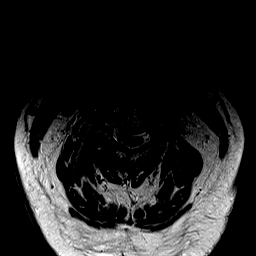
[im 31/36]
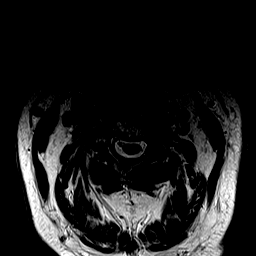
[im 36/36]
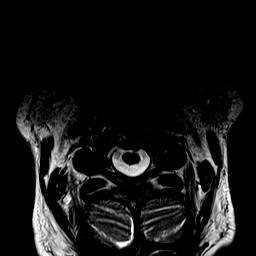

[Series 9: GRE · axial · 3.0mm · 0.35mm/px · z∈[-125,-8]mm · 8 of 36 slices shown]
[im 1/36]
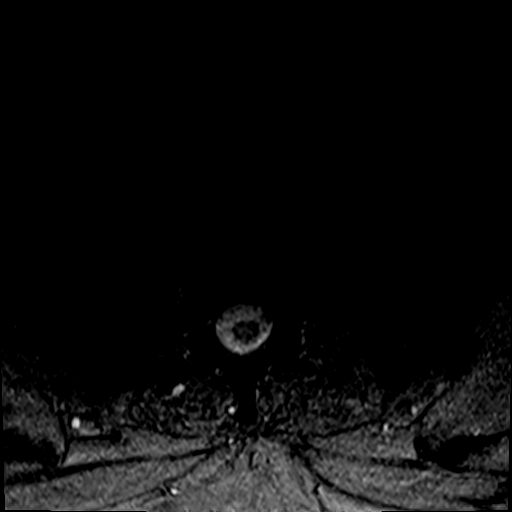
[im 6/36]
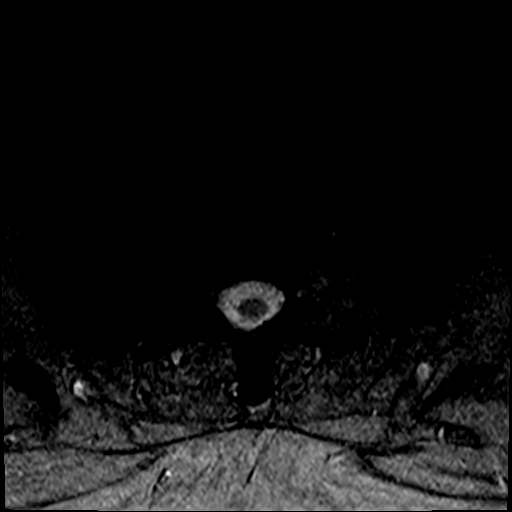
[im 11/36]
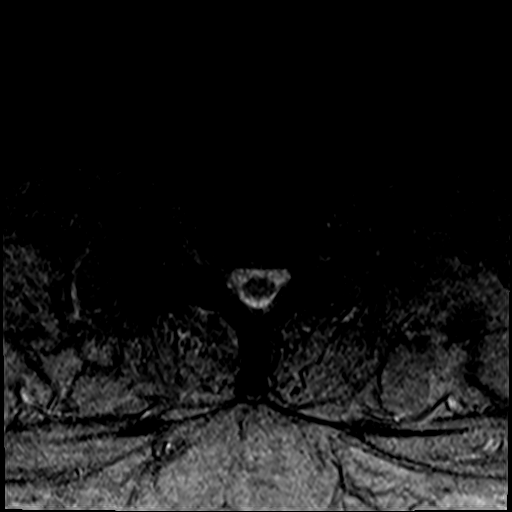
[im 16/36]
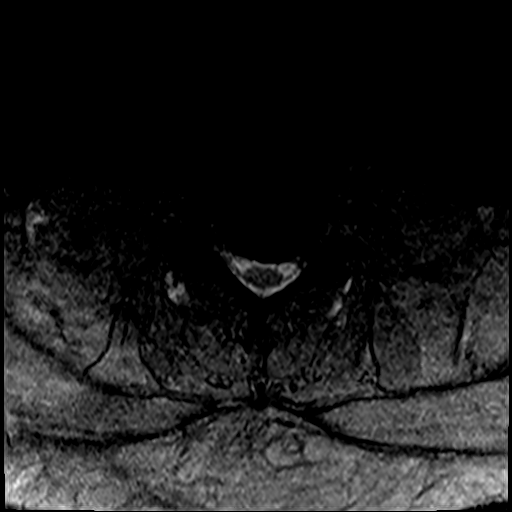
[im 21/36]
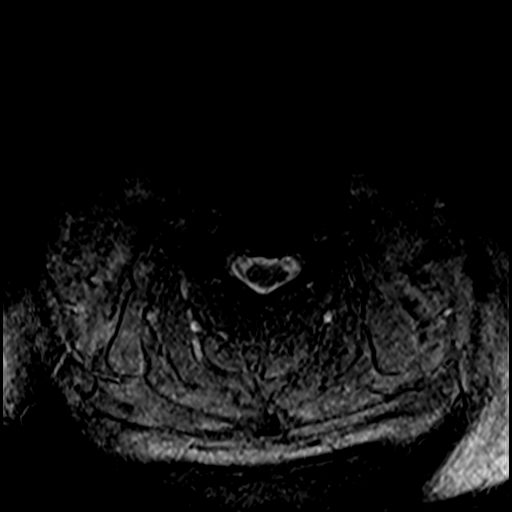
[im 26/36]
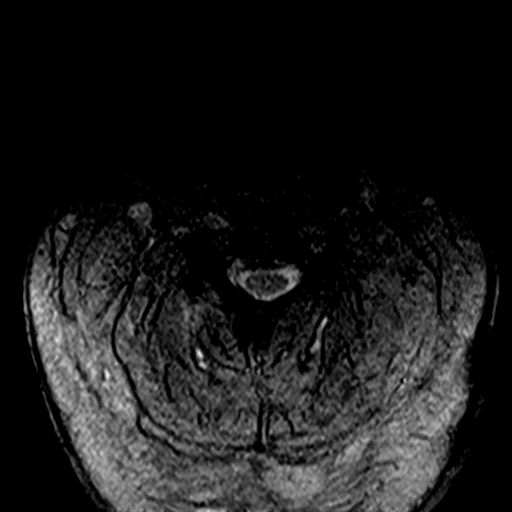
[im 31/36]
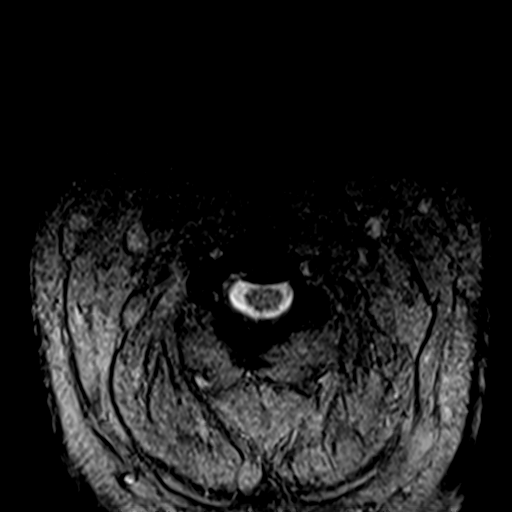
[im 36/36]
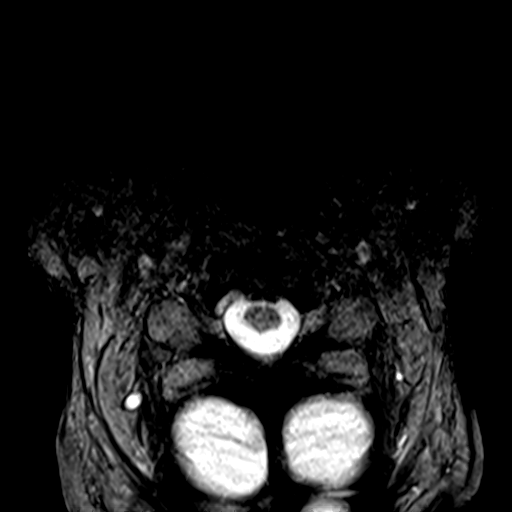

[39 of 48 positions shown; findings below may reference images not displayed]

FINDINGS: Alignment: Physiologic.

Vertebrae: Modic type 2 endplate degenerative changes. No focal
osseous lesion. Sequela of fusion at the C4-C7 levels with ACDF
hardware at the C6-7 level.

Cord: Normal signal and morphology.

Posterior Fossa, vertebral arteries: Chronic microvascular ischemic
changes. Normal flow voids.

Disc levels: Multilevel desiccation.

C2-3: Uncovertebral and facet degenerative spurring. Patent spinal
canal and right neural foramen. Mild left neural foraminal
narrowing.

C3-4: Disc osteophyte complex with superimposed right paracentral
protrusion abutting the ventral cord. Uncovertebral and facet
hypertrophy. Mild spinal canal, mild right and moderate left neural
foraminal narrowing.

C4-5: Sequela of fusion. Patent spinal canal and left neural
foramen. Mild right neural foraminal narrowing.

C5-6: Sequela of fusion. Patent spinal canal and left neural
foramen. Mild right neural foraminal narrowing.

C6-7: Sequela of fusion.  Patent spinal canal and neural foramen.

C7-T1: No significant disc bulge. Uncovertebral and facet
degenerative spurring. Patent spinal canal and neural foramen.

Paraspinal tissues: Negative.
IMPRESSION: Sequela of C4-7 fusion and multilevel spondylosis.

Mild spinal canal, moderate left and mild right neural foraminal
narrowing at the C3-4 level.

Mild right C4-6 and left C2-3 neural foraminal narrowing.

## 2021-11-08 DIAGNOSIS — I10 Essential (primary) hypertension: Secondary | ICD-10-CM | POA: Diagnosis not present

## 2021-11-08 DIAGNOSIS — R7301 Impaired fasting glucose: Secondary | ICD-10-CM | POA: Diagnosis not present

## 2021-11-11 DIAGNOSIS — F17200 Nicotine dependence, unspecified, uncomplicated: Secondary | ICD-10-CM | POA: Diagnosis not present

## 2021-11-11 DIAGNOSIS — E782 Mixed hyperlipidemia: Secondary | ICD-10-CM | POA: Diagnosis not present

## 2021-11-11 DIAGNOSIS — R7301 Impaired fasting glucose: Secondary | ICD-10-CM | POA: Diagnosis not present

## 2021-11-11 DIAGNOSIS — K219 Gastro-esophageal reflux disease without esophagitis: Secondary | ICD-10-CM | POA: Diagnosis not present

## 2021-11-11 DIAGNOSIS — I10 Essential (primary) hypertension: Secondary | ICD-10-CM | POA: Diagnosis not present

## 2021-11-11 DIAGNOSIS — J449 Chronic obstructive pulmonary disease, unspecified: Secondary | ICD-10-CM | POA: Diagnosis not present

## 2021-11-11 DIAGNOSIS — I2510 Atherosclerotic heart disease of native coronary artery without angina pectoris: Secondary | ICD-10-CM | POA: Diagnosis not present

## 2021-11-11 DIAGNOSIS — I6529 Occlusion and stenosis of unspecified carotid artery: Secondary | ICD-10-CM | POA: Diagnosis not present

## 2022-04-03 DIAGNOSIS — R7301 Impaired fasting glucose: Secondary | ICD-10-CM | POA: Diagnosis not present

## 2022-04-03 DIAGNOSIS — E782 Mixed hyperlipidemia: Secondary | ICD-10-CM | POA: Diagnosis not present

## 2022-04-15 DIAGNOSIS — F17200 Nicotine dependence, unspecified, uncomplicated: Secondary | ICD-10-CM | POA: Diagnosis not present

## 2022-04-15 DIAGNOSIS — J449 Chronic obstructive pulmonary disease, unspecified: Secondary | ICD-10-CM | POA: Diagnosis not present

## 2022-04-15 DIAGNOSIS — E782 Mixed hyperlipidemia: Secondary | ICD-10-CM | POA: Diagnosis not present

## 2022-04-15 DIAGNOSIS — R7301 Impaired fasting glucose: Secondary | ICD-10-CM | POA: Diagnosis not present

## 2022-04-15 DIAGNOSIS — I2510 Atherosclerotic heart disease of native coronary artery without angina pectoris: Secondary | ICD-10-CM | POA: Diagnosis not present

## 2022-04-15 DIAGNOSIS — I10 Essential (primary) hypertension: Secondary | ICD-10-CM | POA: Diagnosis not present

## 2022-04-15 DIAGNOSIS — I6529 Occlusion and stenosis of unspecified carotid artery: Secondary | ICD-10-CM | POA: Diagnosis not present

## 2022-04-15 DIAGNOSIS — K219 Gastro-esophageal reflux disease without esophagitis: Secondary | ICD-10-CM | POA: Diagnosis not present

## 2022-04-16 ENCOUNTER — Encounter (INDEPENDENT_AMBULATORY_CARE_PROVIDER_SITE_OTHER): Payer: Self-pay | Admitting: *Deleted

## 2022-04-22 ENCOUNTER — Encounter: Payer: Self-pay | Admitting: *Deleted

## 2022-05-05 ENCOUNTER — Encounter: Payer: Self-pay | Admitting: Urology

## 2022-05-05 ENCOUNTER — Encounter (INDEPENDENT_AMBULATORY_CARE_PROVIDER_SITE_OTHER): Payer: Self-pay | Admitting: *Deleted

## 2022-05-05 ENCOUNTER — Ambulatory Visit: Payer: Medicare Other | Admitting: Urology

## 2022-05-05 VITALS — BP 159/101 | HR 82 | Ht 70.0 in | Wt 220.0 lb

## 2022-05-05 DIAGNOSIS — N3941 Urge incontinence: Secondary | ICD-10-CM

## 2022-05-05 DIAGNOSIS — R3915 Urgency of urination: Secondary | ICD-10-CM

## 2022-05-05 DIAGNOSIS — N401 Enlarged prostate with lower urinary tract symptoms: Secondary | ICD-10-CM

## 2022-05-05 DIAGNOSIS — R32 Unspecified urinary incontinence: Secondary | ICD-10-CM

## 2022-05-05 LAB — BLADDER SCAN AMB NON-IMAGING: Scan Result: 19

## 2022-05-05 NOTE — Progress Notes (Unsigned)
05/05/2022 9:13 AM   Brandon Pratt January 22, 1956 [id number]  Referring provider: Celene Squibb, MD [address] [name],  [place] [address]  No chief complaint on file.   HPI:  New pt -   1) BPH, LUTS - he is on tamsulosin and finasteride. Was on a daily pde5i. No surgery. He voids with a good stream. He has urgency and UUI. Daytime diapers x 2-3. Sometimes enuresis. He has PN. AUASS = 18-19. Urgency most predominate.   His May 2023 PSA was 1.2 (2.4) and Cr 0.92. CT in 2013 with 1.6 cm LUP cyst, "unremarkable" prostate.   Today, seen for the above. He did not void. PVR 19 ml. He drinks a lot of Dr. [name]. 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 He has constipation.   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    PMH: Past Medical History:  Diagnosis Date   Anxiety    Arthritis    Chronic pain syndrome    Coronary atherosclerosis of native coronary artery    Multivessel, occluded SVG to RCA 2/14 - managed medically   Depression    Essential hypertension    GERD (gastroesophageal reflux disease)    History of bronchitis 20+ yrs ago   History of hiatal hernia    Joint pain    Mixed hyperlipidemia     Surgical History: Past Surgical History:  Procedure Laterality Date   APPENDECTOMY     CARDIAC CATHETERIZATION  2014   CARPAL TUNNEL RELEASE Left    CERVICAL SPINE SURGERY     COLONOSCOPY     CORONARY ARTERY BYPASS GRAFT  12/2009   LIMA to LAD, SVG to OM, SVG to diagonal, SVG to RCA   ELBOW SURGERY     "moved nerves over"   GAS/FLUID EXCHANGE Left 03/13/2015   Procedure: GAS/FLUID EXCHANGE;  Surgeon: Hayden Pedro, MD;  Location: [place];  Service: Ophthalmology;  Laterality: Left;   GAS/FLUID EXCHANGE Left 04/08/2016   Procedure: GAS/FLUID EXCHANGE;  Surgeon: Hayden Pedro, MD;  Location: [place];  Service: Ophthalmology;  Laterality: Left;  C3F8   HAND SURGERY Right    carpatal tunnel x2   LASER PHOTO ABLATION Left 03/13/2015   Procedure: LASER PHOTO  ABLATION;  Surgeon: Hayden Pedro, MD;  Location: [place];  Service: Ophthalmology;  Laterality: Left;  Endolaser   LASER PHOTO ABLATION Left 04/08/2016   Procedure: LASER PHOTO ABLATION;  Surgeon: Hayden Pedro, MD;  Location: [place];  Service: Ophthalmology;  Laterality: Left;  Headscope laser   LEFT SHOULDER SURGERY     MEMBRANE PEEL Left 03/13/2015   Procedure: MEMBRANE PEEL;  Surgeon: Hayden Pedro, MD;  Location: [place];  Service: Ophthalmology;  Laterality: Left;   PARS PLANA VITRECTOMY Left 03/13/2015   Procedure: PARS PLANA VITRECTOMY WITH 25 GAUGE;  Surgeon: Hayden Pedro, MD;  Location: [place];  Service: Ophthalmology;  Laterality: Left;   RIGHT SHOULDER SURGERY     SCLERAL BUCKLE Left 04/08/2016   Procedure: SCLERAL BUCKLE;  Surgeon: Hayden Pedro, MD;  Location: [place];  Service: Ophthalmology;  Laterality: Left;    Home Medications:  Allergies as of 05/05/2022       Reactions   Codeine Nausea Only   Upset stomach        Medication List        Accurate as of May 05, 2022  9:13 AM. If you have any questions,  ask your nurse or doctor.          albuterol (2.5 MG/3ML) 0.083% nebulizer solution Commonly known as: PROVENTIL as needed.   Anoro Ellipta 62.5-25 MCG/ACT Aepb Generic drug: umeclidinium-vilanterol Inhale 1 puff into the lungs daily.   aspirin EC 81 MG tablet Take 1 tablet (81 mg total) by mouth daily.   Bevespi Aerosphere 9-4.8 MCG/ACT Aero Generic drug: Glycopyrrolate-Formoterol Inhale into the lungs.   lisinopril 40 MG tablet Commonly known as: ZESTRIL Take 1 tablet (40 mg total) by mouth daily.   LORazepam 1 MG tablet Commonly known as: ATIVAN Take 1 mg by mouth.   multivitamin tablet Take 1 tablet by mouth daily.   nitroGLYCERIN 0.4 MG SL tablet Commonly known as: NITROSTAT Place 1 tablet (0.4 mg total) under the tongue every 5 (five) minutes as needed for chest pain.   omeprazole 20 MG capsule Commonly known as: PRILOSEC Take 20 mg  by mouth every morning.   pravastatin 40 MG tablet Commonly known as: PRAVACHOL Take 40 mg by mouth every evening.   tamsulosin 0.4 MG Caps capsule Commonly known as: FLOMAX Take 0.8 mg by mouth daily.        Allergies:  Allergies  Allergen Reactions   Codeine Nausea Only    Upset stomach    Family History: Family History  Problem Relation Age of Onset   Hypertension Mother    Diabetes Mother    Other Mother        Pacemaker   Stroke Father     Social History:  reports that he has been smoking cigarettes. He started smoking about 62 years ago. He has a 50.00 pack-year smoking history. He quit smokeless tobacco use about 53 years ago.  His smokeless tobacco use included chew. He reports current alcohol use. He reports current drug use. Drug: Marijuana.   Physical Exam: There were no vitals taken for this visit.  Constitutional:  Alert and oriented, No acute distress. HEENT: [place] AT, moist mucus membranes.  Trachea midline, no masses. Cardiovascular: No clubbing, cyanosis, or edema. Respiratory: Normal respiratory effort, no increased work of breathing. GI: Abdomen is soft, nontender, nondistended, no abdominal masses GU: No CVA tenderness Lymph: No cervical or inguinal lymphadenopathy. Skin: No rashes, bruises or suspicious lesions. Neurologic: Grossly intact, no focal deficits, moving all 4 extremities. Psychiatric: Normal mood and affect. DRE: Prostate 30 g, smooth without hard area or nodule   Laboratory Data: Lab Results  Component Value Date   WBC 13.6 (H) 02/24/2018   HGB 16.0 02/24/2018   HCT 47.3 02/24/2018   MCV 96.9 02/24/2018   PLT 204 02/24/2018    Lab Results  Component Value Date   CREATININE 0.77 02/24/2018    No results found for: PSA  No results found for: TESTOSTERONE  Lab Results  Component Value Date   HGBA1C (H) 12/20/2009    6.4 (NOTE) The ADA recommends the following therapeutic goal for glycemic control related to Hgb A1c  measurement: Goal of therapy: <6.5 Hgb A1c  Reference: American Diabetes Association: Clinical Practice Recommendations 2010, Diabetes Care, 2010, 33: (Suppl  1).    Urinalysis    Component Value Date/Time   COLORURINE YELLOW 12/20/2009 0937   APPEARANCEUR CLOUDY (A) 12/20/2009 0937   LABSPEC 1.012 12/20/2009 [place] 6.5 12/20/2009 [place] 12/20/2009 [place] 12/20/2009 [place] 12/20/2009 [place] 12/20/2009 [place] NEGATIVE 12/20/2009 6063  UROBILINOGEN 1.0 12/20/2009 0937   NITRITE NEGATIVE 12/20/2009 0937   LEUKOCYTESUR TRACE (A) 12/20/2009 0937    No results found for: LABMICR, WBCUA, RBCUA, LABEPIT, MUCUS, BACTERIA  Pertinent Imaging: CT reports   Assessment & Plan:    Bph - luts - disc nature r/b/a to alpha blocker, 5ari, daily pde5i, OAB meds. He really needs to cut back on the Dr. [name] and increase water. This can help his bladder and bowel. See back in 3 mo to check progress and check UA.    No follow-ups on file.  Festus Aloe, MD  [facility]  [address] [place], [place] [address] [phone]

## 2022-05-05 NOTE — Progress Notes (Unsigned)
post void residual =19mL 

## 2022-06-26 ENCOUNTER — Ambulatory Visit (INDEPENDENT_AMBULATORY_CARE_PROVIDER_SITE_OTHER): Payer: Medicare Other | Admitting: Gastroenterology

## 2022-06-26 ENCOUNTER — Encounter (INDEPENDENT_AMBULATORY_CARE_PROVIDER_SITE_OTHER): Payer: Self-pay

## 2022-06-26 ENCOUNTER — Encounter (INDEPENDENT_AMBULATORY_CARE_PROVIDER_SITE_OTHER): Payer: Self-pay | Admitting: Gastroenterology

## 2022-06-26 ENCOUNTER — Telehealth (INDEPENDENT_AMBULATORY_CARE_PROVIDER_SITE_OTHER): Payer: Self-pay

## 2022-06-26 ENCOUNTER — Other Ambulatory Visit (INDEPENDENT_AMBULATORY_CARE_PROVIDER_SITE_OTHER): Payer: Self-pay

## 2022-06-26 VITALS — BP 154/96 | HR 71 | Temp 98.0°F | Ht 70.0 in | Wt 227.0 lb

## 2022-06-26 DIAGNOSIS — R1310 Dysphagia, unspecified: Secondary | ICD-10-CM | POA: Diagnosis not present

## 2022-06-26 DIAGNOSIS — Z1211 Encounter for screening for malignant neoplasm of colon: Secondary | ICD-10-CM | POA: Diagnosis not present

## 2022-06-26 MED ORDER — PEG 3350-KCL-NA BICARB-NACL 420 G PO SOLR: 4000.0000 mL | ORAL | 0 refills | Status: DC

## 2022-06-26 NOTE — Patient Instructions (Signed)
We will get you scheduled for upper endoscopy for your swallowing issues and update screening colonoscopy  Follow up 3 months

## 2022-06-26 NOTE — Telephone Encounter (Signed)
Antaeus Karel Ann Jovontae Banko, CMA  ?

## 2022-06-26 NOTE — Progress Notes (Signed)
Referring Provider: Celene Squibb, MD Primary Care Physician:  Celene Squibb, MD Primary GI Physician: new  Chief Complaint  Patient presents with   Colon Cancer Screening    New patient. Referred for routine colonoscopy.    HPI:   Brandon Pratt is a 66 y.o. male with past medical history of anxiety, arthritis, chronic pain, CAD, depression, HTN, GERD, hiatal hernia, HLD  Patient presenting today as a new patient for screening colonoscopy   Patient reports that he had a colonoscopy that was normal about 10 years ago. About 1 year ago, he started having less frequent BMs? Denies constipation. Has a BM every day. Does not take anything to help him go. Sometimes stools are soft and sometimes more hard. Denies pencil thin stools. Denies rectal bleeding or melena. Denies weight loss. Feels that foods don't really taste right to him. Endorses some generalized abdominal pain daily. He denies any precipitating or alleviating factors. States pain has been present for "a right good while." No nausea or vomiting. He does endorse dysphagia almost everytime he eats. Bread tends to cause worse issues for him. No episodes of food impaction. Sometimes has to cough the foods back up. No issues with liquids or pills. Denies GERD symptoms, maintained on omeprazole with good results.   NSAID use:no otc NSAIDs, maintained on mobic Social hx: smokes 1PPD, marijuana use, has multiple drinks of brandy a few times per week, maybe 2-3 Fam hx: no crc or liver disease   Last Colonoscopy:maybe 10 years ago  Last Endoscopy: maybe 10 years ago, no findings per patient.  Recommendations:    Past Medical History:  Diagnosis Date   Anxiety    Arthritis    Chronic pain syndrome    Coronary atherosclerosis of native coronary artery    Multivessel, occluded SVG to RCA 2/14 - managed medically   Depression    Essential hypertension    GERD (gastroesophageal reflux disease)    History of bronchitis 20+ yrs ago    History of hiatal hernia    Joint pain    Mixed hyperlipidemia     Past Surgical History:  Procedure Laterality Date   APPENDECTOMY     CARDIAC CATHETERIZATION  2014   CARPAL TUNNEL RELEASE Left    CERVICAL SPINE SURGERY     COLONOSCOPY     CORONARY ARTERY BYPASS GRAFT  12/2009   LIMA to LAD, SVG to OM, SVG to diagonal, SVG to RCA   ELBOW SURGERY     "moved nerves over"   GAS/FLUID EXCHANGE Left 03/13/2015   Procedure: GAS/FLUID EXCHANGE;  Surgeon: Hayden Pedro, MD;  Location: [place];  Service: Ophthalmology;  Laterality: Left;   GAS/FLUID EXCHANGE Left 04/08/2016   Procedure: GAS/FLUID EXCHANGE;  Surgeon: Hayden Pedro, MD;  Location: [place];  Service: Ophthalmology;  Laterality: Left;  C3F8   HAND SURGERY Right    carpatal tunnel x2   LASER PHOTO ABLATION Left 03/13/2015   Procedure: LASER PHOTO ABLATION;  Surgeon: Hayden Pedro, MD;  Location: [place];  Service: Ophthalmology;  Laterality: Left;  Endolaser   LASER PHOTO ABLATION Left 04/08/2016   Procedure: LASER PHOTO ABLATION;  Surgeon: Hayden Pedro, MD;  Location: [place];  Service: Ophthalmology;  Laterality: Left;  Headscope laser   LEFT SHOULDER SURGERY     MEMBRANE PEEL Left 03/13/2015   Procedure: MEMBRANE PEEL;  Surgeon: Hayden Pedro, MD;  Location: [place];  Service: Ophthalmology;  Laterality: Left;  PARS PLANA VITRECTOMY Left 03/13/2015   Procedure: PARS PLANA VITRECTOMY WITH 25 GAUGE;  Surgeon: Hayden Pedro, MD;  Location: [place];  Service: Ophthalmology;  Laterality: Left;   RIGHT SHOULDER SURGERY     SCLERAL BUCKLE Left 04/08/2016   Procedure: SCLERAL BUCKLE;  Surgeon: Hayden Pedro, MD;  Location: [place];  Service: Ophthalmology;  Laterality: Left;    Current Outpatient Medications  Medication Sig Dispense Refill   aspirin EC 81 MG tablet Take 1 tablet (81 mg total) by mouth daily.     BEVESPI AEROSPHERE 9-4.8 MCG/ACT AERO Inhale into the lungs.     finasteride (PROSCAR) 5 MG tablet Take 5 mg by mouth daily.      lisinopril (ZESTRIL) 40 MG tablet Take 1 tablet (40 mg total) by mouth daily. 90 tablet 3   LORazepam (ATIVAN) 1 MG tablet Take 1 mg by mouth every 8 (eight) hours as needed.     meloxicam (MOBIC) 7.5 MG tablet Take 7.5 mg by mouth daily.     Multiple Vitamin (MULTIVITAMIN) tablet Take 1 tablet by mouth daily.     nitroGLYCERIN (NITROSTAT) 0.4 MG SL tablet Place 1 tablet (0.4 mg total) under the tongue every 5 (five) minutes as needed for chest pain. 25 tablet 3   omeprazole (PRILOSEC) 20 MG capsule Take 20 mg by mouth every morning.      OVER THE COUNTER MEDICATION Balance of nature whole produce fruit and veggies dietary supplement - takes 3 per day     pravastatin (PRAVACHOL) 40 MG tablet Take 40 mg by mouth every evening.      tamsulosin (FLOMAX) 0.4 MG CAPS capsule Take 0.8 mg by mouth daily.     albuterol (PROVENTIL) (2.5 MG/3ML) 0.083% nebulizer solution as needed. (Patient not taking: Reported on 06/26/2022)     ANORO ELLIPTA 62.5-25 MCG/INH AEPB Inhale 1 puff into the lungs daily. (Patient not taking: Reported on 06/26/2022)     Ascorbic Acid (VITAMIN C) 100 MG tablet Take 100 mg by mouth daily. (Patient not taking: Reported on 06/26/2022)     No current facility-administered medications for this visit.    Allergies as of 06/26/2022 - Review Complete 05/05/2022  Allergen Reaction Noted   Codeine Nausea Only     Family History  Problem Relation Age of Onset   Hypertension Mother    Diabetes Mother    Other Mother        Pacemaker   Stroke Father     Social History   Socioeconomic History   Marital status: Married    Spouse name: Not on file   Number of children: Not on file   Years of education: Not on file   Highest education level: Not on file  Occupational History    Employer: DISABLED    Comment: Disabled  Tobacco Use   Smoking status: Every Day    Packs/day: 1.00    Years: 50.00    Total pack years: 50.00    Types: Cigarettes    Start date: 05/13/1960     Passive exposure: Current   Smokeless tobacco: Former    Types: Chew    Quit date: 12/01/1968  Substance and Sexual Activity   Alcohol use: Yes    Comment: rare   Drug use: Yes    Types: Marijuana    Comment: Smokes POT occasionally to help with pain. last time few days ago   Sexual activity: Not on file  Other Topics Concern   Not on file  Social History Narrative   Not on file   Social Determinants of Health   Financial Resource Strain: Not on file  Food Insecurity: Not on file  Transportation Needs: Not on file  Physical Activity: Not on file  Stress: Not on file  Social Connections: Not on file   Review of systems General: negative for malaise, night sweats, fever, chills, weight loss Neck: Negative for lumps, goiter, pain and significant neck swelling Resp: Negative for cough, wheezing, dyspnea at rest CV: Negative for chest pain, leg swelling, palpitations, orthopnea GI: denies melena, hematochezia, nausea, vomiting, diarrhea, constipation, dysphagia, odyonophagia, early satiety or unintentional weight loss. +dysphagia +generalized abdominal pain MSK: Negative for joint pain or swelling, back pain, and muscle pain. Derm: Negative for itching or rash Psych: Denies depression, anxiety, memory loss, confusion. No homicidal or suicidal ideation.  Heme: Negative for prolonged bleeding, bruising easily, and swollen nodes. Endocrine: Negative for cold or heat intolerance, polyuria, polydipsia and goiter. Neuro: negative for tremor, gait imbalance, syncope and seizures. The remainder of the review of systems is noncontributory.  Physical Exam: BP (!) 154/96 (BP Location: Left Arm, Patient Position: Sitting, Cuff Size: Large)   Pulse 71   Temp 98 F (36.7 C) (Oral)   Ht '5\' 10"'$  (1.778 m)   Wt 227 lb (103 kg)   BMI 32.57 kg/m  General:   Alert and oriented. No distress noted. Pleasant and cooperative.  Head:  Normocephalic and atraumatic. Eyes:  Conjuctiva clear without  scleral icterus. Mouth:  Oral mucosa pink and moist. Good dentition. No lesions. Heart: Normal rate and rhythm, s1 and s2 heart sounds present.  Lungs: wheezing throughout . Abdomen:  +BS, soft, non-tender and non-distended. No rebound or guarding. No HSM or masses noted. Derm: No palmar erythema or jaundice Msk:  Symmetrical without gross deformities. Normal posture. Extremities:  Without edema. Neurologic:  Alert and  oriented x4 Psych:  Alert and cooperative. Normal mood and affect.  Invalid input(s): "6 MONTHS"   ASSESSMENT: CHAMPION CORALES is a 66 y.o. male presenting today as a new patient for screening colonoscopy and dysphagia.  Patient somewhat of a poor historian, difficult to obtain history form. He reports less frequent BMs about a year ago, though tells me he has 1 BM per day, sometimes stools are a little harder though he denies straining.  . Last colonoscopy was about 10 years ago. He is amenable to proceeding with updating colonoscopy.  He does endorse some dysphagia, occurring almost daily. Denies GERD symptoms on omeprazole daily. Breads usually cause more issues with swallowing. No episodes of food impaction, no issues with liquids of pills. Recommend proceeding with EGD as we cannot rule out esophageal ring, web, stricture, stenosis or less likely, malignancy.   Patient denies melena, hematochezia, nausea, vomiting, diarrhea, constipation, odynophagia, early satiety or weight loss.  Indications, risks and benefits of procedure discussed in detail with patient. Patient verbalized understanding and is in agreement to proceed with EGD/Colonoscopy at this time.    PLAN:  Schedule EGD, colonoscopy ASA III  2. Chew thoroughly, small bites, sips of liquids between bites 3. Avoid thicker, dryer foods  All questions were answered, patient verbalized understanding and is in agreement with plan as outlined above.   Follow Up: 3 months   Asley Baskerville L. Alver Sorrow, MSN, APRN,  AGNP-C Adult-Gerontology Nurse Practitioner [facility]

## 2022-06-26 NOTE — H&P (View-Only) (Signed)
Referring Provider: Celene Squibb, MD Primary Care Physician:  Celene Squibb, MD Primary GI Physician: new  Chief Complaint  Patient presents with   Colon Cancer Screening    New patient. Referred for routine colonoscopy.    HPI:   Brandon Pratt is a 66 y.o. male with past medical history of anxiety, arthritis, chronic pain, CAD, depression, HTN, GERD, hiatal hernia, HLD  Patient presenting today as a new patient for screening colonoscopy   Patient reports that he had a colonoscopy that was normal about 10 years ago. About 1 year ago, he started having less frequent BMs? Denies constipation. Has a BM every day. Does not take anything to help him go. Sometimes stools are soft and sometimes more hard. Denies pencil thin stools. Denies rectal bleeding or melena. Denies weight loss. Feels that foods don't really taste right to him. Endorses some generalized abdominal pain daily. He denies any precipitating or alleviating factors. States pain has been present for "a right good while." No nausea or vomiting. He does endorse dysphagia almost everytime he eats. Bread tends to cause worse issues for him. No episodes of food impaction. Sometimes has to cough the foods back up. No issues with liquids or pills. Denies GERD symptoms, maintained on omeprazole with good results.   NSAID use:no otc NSAIDs, maintained on mobic Social hx: smokes 1PPD, marijuana use, has multiple drinks of brandy a few times per week, maybe 2-3 Fam hx: no crc or liver disease   Last Colonoscopy:maybe 10 years ago  Last Endoscopy: maybe 10 years ago, no findings per patient.  Recommendations:    Past Medical History:  Diagnosis Date   Anxiety    Arthritis    Chronic pain syndrome    Coronary atherosclerosis of native coronary artery    Multivessel, occluded SVG to RCA 2/14 - managed medically   Depression    Essential hypertension    GERD (gastroesophageal reflux disease)    History of bronchitis 20+ yrs ago    History of hiatal hernia    Joint pain    Mixed hyperlipidemia     Past Surgical History:  Procedure Laterality Date   APPENDECTOMY     CARDIAC CATHETERIZATION  2014   CARPAL TUNNEL RELEASE Left    CERVICAL SPINE SURGERY     COLONOSCOPY     CORONARY ARTERY BYPASS GRAFT  12/2009   LIMA to LAD, SVG to OM, SVG to diagonal, SVG to RCA   ELBOW SURGERY     "moved nerves over"   GAS/FLUID EXCHANGE Left 03/13/2015   Procedure: GAS/FLUID EXCHANGE;  Surgeon: Hayden Pedro, MD;  Location: [place];  Service: Ophthalmology;  Laterality: Left;   GAS/FLUID EXCHANGE Left 04/08/2016   Procedure: GAS/FLUID EXCHANGE;  Surgeon: Hayden Pedro, MD;  Location: [place];  Service: Ophthalmology;  Laterality: Left;  C3F8   HAND SURGERY Right    carpatal tunnel x2   LASER PHOTO ABLATION Left 03/13/2015   Procedure: LASER PHOTO ABLATION;  Surgeon: Hayden Pedro, MD;  Location: [place];  Service: Ophthalmology;  Laterality: Left;  Endolaser   LASER PHOTO ABLATION Left 04/08/2016   Procedure: LASER PHOTO ABLATION;  Surgeon: Hayden Pedro, MD;  Location: [place];  Service: Ophthalmology;  Laterality: Left;  Headscope laser   LEFT SHOULDER SURGERY     MEMBRANE PEEL Left 03/13/2015   Procedure: MEMBRANE PEEL;  Surgeon: Hayden Pedro, MD;  Location: [place];  Service: Ophthalmology;  Laterality: Left;  PARS PLANA VITRECTOMY Left 03/13/2015   Procedure: PARS PLANA VITRECTOMY WITH 25 GAUGE;  Surgeon: Hayden Pedro, MD;  Location: [place];  Service: Ophthalmology;  Laterality: Left;   RIGHT SHOULDER SURGERY     SCLERAL BUCKLE Left 04/08/2016   Procedure: SCLERAL BUCKLE;  Surgeon: Hayden Pedro, MD;  Location: [place];  Service: Ophthalmology;  Laterality: Left;    Current Outpatient Medications  Medication Sig Dispense Refill   aspirin EC 81 MG tablet Take 1 tablet (81 mg total) by mouth daily.     BEVESPI AEROSPHERE 9-4.8 MCG/ACT AERO Inhale into the lungs.     finasteride (PROSCAR) 5 MG tablet Take 5 mg by mouth daily.      lisinopril (ZESTRIL) 40 MG tablet Take 1 tablet (40 mg total) by mouth daily. 90 tablet 3   LORazepam (ATIVAN) 1 MG tablet Take 1 mg by mouth every 8 (eight) hours as needed.     meloxicam (MOBIC) 7.5 MG tablet Take 7.5 mg by mouth daily.     Multiple Vitamin (MULTIVITAMIN) tablet Take 1 tablet by mouth daily.     nitroGLYCERIN (NITROSTAT) 0.4 MG SL tablet Place 1 tablet (0.4 mg total) under the tongue every 5 (five) minutes as needed for chest pain. 25 tablet 3   omeprazole (PRILOSEC) 20 MG capsule Take 20 mg by mouth every morning.      OVER THE COUNTER MEDICATION Balance of nature whole produce fruit and veggies dietary supplement - takes 3 per day     pravastatin (PRAVACHOL) 40 MG tablet Take 40 mg by mouth every evening.      tamsulosin (FLOMAX) 0.4 MG CAPS capsule Take 0.8 mg by mouth daily.     albuterol (PROVENTIL) (2.5 MG/3ML) 0.083% nebulizer solution as needed. (Patient not taking: Reported on 06/26/2022)     ANORO ELLIPTA 62.5-25 MCG/INH AEPB Inhale 1 puff into the lungs daily. (Patient not taking: Reported on 06/26/2022)     Ascorbic Acid (VITAMIN C) 100 MG tablet Take 100 mg by mouth daily. (Patient not taking: Reported on 06/26/2022)     No current facility-administered medications for this visit.    Allergies as of 06/26/2022 - Review Complete 05/05/2022  Allergen Reaction Noted   Codeine Nausea Only     Family History  Problem Relation Age of Onset   Hypertension Mother    Diabetes Mother    Other Mother        Pacemaker   Stroke Father     Social History   Socioeconomic History   Marital status: Married    Spouse name: Not on file   Number of children: Not on file   Years of education: Not on file   Highest education level: Not on file  Occupational History    Employer: DISABLED    Comment: Disabled  Tobacco Use   Smoking status: Every Day    Packs/day: 1.00    Years: 50.00    Total pack years: 50.00    Types: Cigarettes    Start date: 05/13/1960     Passive exposure: Current   Smokeless tobacco: Former    Types: Chew    Quit date: 12/01/1968  Substance and Sexual Activity   Alcohol use: Yes    Comment: rare   Drug use: Yes    Types: Marijuana    Comment: Smokes POT occasionally to help with pain. last time few days ago   Sexual activity: Not on file  Other Topics Concern   Not on file  Social History Narrative   Not on file   Social Determinants of Health   Financial Resource Strain: Not on file  Food Insecurity: Not on file  Transportation Needs: Not on file  Physical Activity: Not on file  Stress: Not on file  Social Connections: Not on file   Review of systems General: negative for malaise, night sweats, fever, chills, weight loss Neck: Negative for lumps, goiter, pain and significant neck swelling Resp: Negative for cough, wheezing, dyspnea at rest CV: Negative for chest pain, leg swelling, palpitations, orthopnea GI: denies melena, hematochezia, nausea, vomiting, diarrhea, constipation, dysphagia, odyonophagia, early satiety or unintentional weight loss. +dysphagia +generalized abdominal pain MSK: Negative for joint pain or swelling, back pain, and muscle pain. Derm: Negative for itching or rash Psych: Denies depression, anxiety, memory loss, confusion. No homicidal or suicidal ideation.  Heme: Negative for prolonged bleeding, bruising easily, and swollen nodes. Endocrine: Negative for cold or heat intolerance, polyuria, polydipsia and goiter. Neuro: negative for tremor, gait imbalance, syncope and seizures. The remainder of the review of systems is noncontributory.  Physical Exam: BP (!) 154/96 (BP Location: Left Arm, Patient Position: Sitting, Cuff Size: Large)   Pulse 71   Temp 98 F (36.7 C) (Oral)   Ht '5\' 10"'$  (1.778 m)   Wt 227 lb (103 kg)   BMI 32.57 kg/m  General:   Alert and oriented. No distress noted. Pleasant and cooperative.  Head:  Normocephalic and atraumatic. Eyes:  Conjuctiva clear without  scleral icterus. Mouth:  Oral mucosa pink and moist. Good dentition. No lesions. Heart: Normal rate and rhythm, s1 and s2 heart sounds present.  Lungs: wheezing throughout . Abdomen:  +BS, soft, non-tender and non-distended. No rebound or guarding. No HSM or masses noted. Derm: No palmar erythema or jaundice Msk:  Symmetrical without gross deformities. Normal posture. Extremities:  Without edema. Neurologic:  Alert and  oriented x4 Psych:  Alert and cooperative. Normal mood and affect.  Invalid input(s): "6 MONTHS"   ASSESSMENT: OLAOLUWA GRIEDER is a 66 y.o. male presenting today as a new patient for screening colonoscopy and dysphagia.  Patient somewhat of a poor historian, difficult to obtain history form. He reports less frequent BMs about a year ago, though tells me he has 1 BM per day, sometimes stools are a little harder though he denies straining.  . Last colonoscopy was about 10 years ago. He is amenable to proceeding with updating colonoscopy.  He does endorse some dysphagia, occurring almost daily. Denies GERD symptoms on omeprazole daily. Breads usually cause more issues with swallowing. No episodes of food impaction, no issues with liquids of pills. Recommend proceeding with EGD as we cannot rule out esophageal ring, web, stricture, stenosis or less likely, malignancy.   Patient denies melena, hematochezia, nausea, vomiting, diarrhea, constipation, odynophagia, early satiety or weight loss.  Indications, risks and benefits of procedure discussed in detail with patient. Patient verbalized understanding and is in agreement to proceed with EGD/Colonoscopy at this time.    PLAN:  Schedule EGD, colonoscopy ASA III  2. Chew thoroughly, small bites, sips of liquids between bites 3. Avoid thicker, dryer foods  All questions were answered, patient verbalized understanding and is in agreement with plan as outlined above.   Follow Up: 3 months   Hawkins Seaman L. Alver Sorrow, MSN, APRN,  AGNP-C Adult-Gerontology Nurse Practitioner [facility]

## 2022-06-30 ENCOUNTER — Encounter (INDEPENDENT_AMBULATORY_CARE_PROVIDER_SITE_OTHER): Payer: Self-pay

## 2022-07-23 NOTE — Patient Instructions (Signed)
Brandon Pratt  07/23/2022     '@PREFPERIOPPHARMACY'$ @   Your procedure is scheduled on  07/29/2022.   Report to [facility] at  1100 A.M.   Call this number if you have problems the morning of surgery:  [phone]   Remember:  Follow the diet and prep instructions given to you by the office.     Take these medicines the morning of surgery with A SIP OF WATER              Proscar, ativan (if needed), mobic(if needed), prilosec, flomax.     Do not wear jewelry, make-up or nail polish.  Do not wear lotions, powders, or perfumes, or deodorant.  Do not shave 48 hours prior to surgery.  Men may shave face and neck.  Do not bring valuables to the hospital.  [facility] is not responsible for any belongings or valuables.  Contacts, dentures or bridgework may not be worn into surgery.  Leave your suitcase in the car.  After surgery it may be brought to your room.  For patients admitted to the hospital, discharge time will be determined by your treatment team.  Patients discharged the day of surgery will not be allowed to drive home and must have someone with you for 24 hours.     Special instructions:   DO NOT smoke tobacco or vape for 24 hours before your procedure.  Please read over the following fact sheets that you were given. Anesthesia Post-op Instructions and Care and Recovery After Surgery      Colonoscopy, Adult, Care After The following information offers guidance on how to care for yourself after your procedure. Your health care provider may also give you more specific instructions. If you have problems or questions, contact your health care provider. What can I expect after the procedure? After the procedure, it is common to have: A small amount of blood in your stool for 24 hours after the procedure. Some gas. Mild cramping or bloating of your abdomen. Follow these instructions at home: Eating and drinking  Drink enough fluid to keep your urine pale  yellow. Follow instructions from your health care provider about eating or drinking restrictions. Resume your normal diet as told by your health care provider. Avoid heavy or fried foods that are hard to digest. Activity Rest as told by your health care provider. Avoid sitting for a long time without moving. Get up to take short walks every 1-2 hours. This is important to improve blood flow and breathing. Ask for help if you feel weak or unsteady. Return to your normal activities as told by your health care provider. Ask your health care provider what activities are safe for you. Managing cramping and bloating  Try walking around when you have cramps or feel bloated. If directed, apply heat to your abdomen as told by your health care provider. Use the heat source that your health care provider recommends, such as a moist heat pack or a heating pad. Place a towel between your skin and the heat source. Leave the heat on for 20-30 minutes. Remove the heat if your skin turns bright red. This is especially important if you are unable to feel pain, heat, or cold. You have a greater risk of getting burned. General instructions If you were given a sedative during the procedure, it can affect you for several hours. Do not drive or operate machinery until your health care provider says that it is  safe. For the first 24 hours after the procedure: Do not sign important documents. Do not drink alcohol. Do your regular daily activities at a slower pace than normal. Eat soft foods that are easy to digest. Take over-the-counter and prescription medicines only as told by your health care provider. Keep all follow-up visits. This is important. Contact a health care provider if: You have blood in your stool 2-3 days after the procedure. Get help right away if: You have more than a small spotting of blood in your stool. You have large blood clots in your stool. You have swelling of your abdomen. You have  nausea or vomiting. You have a fever. You have increasing pain in your abdomen that is not relieved with medicine. These symptoms may be an emergency. Get help right away. Call 911. Do not wait to see if the symptoms will go away. Do not drive yourself to the hospital. Summary After the procedure, it is common to have a small amount of blood in your stool. You may also have mild cramping and bloating of your abdomen. If you were given a sedative during the procedure, it can affect you for several hours. Do not drive or operate machinery until your health care provider says that it is safe. Get help right away if you have a lot of blood in your stool, nausea or vomiting, a fever, or increased pain in your abdomen. This information is not intended to replace advice given to you by your health care provider. Make sure you discuss any questions you have with your health care provider. Document Revised: 07/10/2021 Document Reviewed: 07/10/2021 Elsevier Patient Education  [place] After This sheet gives you information about how to care for yourself after your procedure. Your health care provider may also give you more specific instructions. If you have problems or questions, contact your health care provider. What can I expect after the procedure? After the procedure, it is common to have: Tiredness. Forgetfulness about what happened after the procedure. Impaired judgment for important decisions. Nausea or vomiting. Some difficulty with balance. Follow these instructions at home: For the time period you were told by your health care provider:     Rest as needed. Do not participate in activities where you could fall or become injured. Do not drive or use machinery. Do not drink alcohol. Do not take sleeping pills or medicines that cause drowsiness. Do not make important decisions or sign legal documents. Do not take care of children on your  own. Eating and drinking Follow the diet that is recommended by your health care provider. Drink enough fluid to keep your urine pale yellow. If you vomit: Drink water, juice, or soup when you can drink without vomiting. Make sure you have little or no nausea before eating solid foods. General instructions Have a responsible adult stay with you for the time you are told. It is important to have someone help care for you until you are awake and alert. Take over-the-counter and prescription medicines only as told by your health care provider. If you have sleep apnea, surgery and certain medicines can increase your risk for breathing problems. Follow instructions from your health care provider about wearing your sleep device: Anytime you are sleeping, including during daytime naps. While taking prescription pain medicines, sleeping medicines, or medicines that make you drowsy. Avoid smoking. Keep all follow-up visits as told by your health care provider. This is important. Contact a health care provider if:  You keep feeling nauseous or you keep vomiting. You feel light-headed. You are still sleepy or having trouble with balance after 24 hours. You develop a rash. You have a fever. You have redness or swelling around the IV site. Get help right away if: You have trouble breathing. You have new-onset confusion at home. Summary For several hours after your procedure, you may feel tired. You may also be forgetful and have poor judgment. Have a responsible adult stay with you for the time you are told. It is important to have someone help care for you until you are awake and alert. Rest as told. Do not drive or operate machinery. Do not drink alcohol or take sleeping pills. Get help right away if you have trouble breathing, or if you suddenly become confused. This information is not intended to replace advice given to you by your health care provider. Make sure you discuss any questions you  have with your health care provider. Document Revised: 10/22/2021 Document Reviewed: 10/20/2019 Elsevier Patient Education  [place].

## 2022-07-24 ENCOUNTER — Encounter (HOSPITAL_COMMUNITY): Payer: Self-pay

## 2022-07-24 ENCOUNTER — Ambulatory Visit (HOSPITAL_BASED_OUTPATIENT_CLINIC_OR_DEPARTMENT_OTHER): Payer: Medicare Other | Admitting: Anesthesiology

## 2022-07-24 ENCOUNTER — Ambulatory Visit (HOSPITAL_COMMUNITY)
Admission: RE | Admit: 2022-07-24 | Discharge: 2022-07-24 | Disposition: A | Discharge status: Home / Self Care | Payer: Medicare Other | Attending: Gastroenterology | Admitting: Gastroenterology

## 2022-07-24 ENCOUNTER — Ambulatory Visit (HOSPITAL_COMMUNITY): Payer: Medicare Other | Admitting: Anesthesiology

## 2022-07-24 ENCOUNTER — Encounter (HOSPITAL_COMMUNITY)
Admission: RE | Disposition: A | Discharge status: Home / Self Care | Payer: Self-pay | Source: Home / Self Care | Attending: Gastroenterology

## 2022-07-24 ENCOUNTER — Encounter (HOSPITAL_COMMUNITY): Payer: Self-pay | Admitting: Gastroenterology

## 2022-07-24 ENCOUNTER — Encounter (HOSPITAL_COMMUNITY)
Admission: RE | Admit: 2022-07-24 | Discharge: 2022-07-24 | Disposition: A | Discharge status: Home / Self Care | Payer: Medicare Other | Source: Ambulatory Visit | Attending: Gastroenterology | Admitting: Gastroenterology

## 2022-07-24 VITALS — BP 173/99 | HR 69 | Temp 98.5°F | Resp 18 | Ht 70.0 in | Wt 220.0 lb

## 2022-07-24 DIAGNOSIS — Z1211 Encounter for screening for malignant neoplasm of colon: Secondary | ICD-10-CM

## 2022-07-24 DIAGNOSIS — D124 Benign neoplasm of descending colon: Secondary | ICD-10-CM | POA: Diagnosis not present

## 2022-07-24 DIAGNOSIS — K648 Other hemorrhoids: Secondary | ICD-10-CM | POA: Diagnosis not present

## 2022-07-24 DIAGNOSIS — I10 Essential (primary) hypertension: Secondary | ICD-10-CM | POA: Insufficient documentation

## 2022-07-24 DIAGNOSIS — Z01810 Encounter for preprocedural cardiovascular examination: Secondary | ICD-10-CM | POA: Insufficient documentation

## 2022-07-24 DIAGNOSIS — R1310 Dysphagia, unspecified: Secondary | ICD-10-CM | POA: Diagnosis not present

## 2022-07-24 DIAGNOSIS — F17210 Nicotine dependence, cigarettes, uncomplicated: Secondary | ICD-10-CM | POA: Diagnosis not present

## 2022-07-24 DIAGNOSIS — K219 Gastro-esophageal reflux disease without esophagitis: Secondary | ICD-10-CM | POA: Insufficient documentation

## 2022-07-24 DIAGNOSIS — F419 Anxiety disorder, unspecified: Secondary | ICD-10-CM | POA: Diagnosis not present

## 2022-07-24 DIAGNOSIS — I2510 Atherosclerotic heart disease of native coronary artery without angina pectoris: Secondary | ICD-10-CM | POA: Insufficient documentation

## 2022-07-24 DIAGNOSIS — D120 Benign neoplasm of cecum: Secondary | ICD-10-CM

## 2022-07-24 DIAGNOSIS — K449 Diaphragmatic hernia without obstruction or gangrene: Secondary | ICD-10-CM

## 2022-07-24 DIAGNOSIS — D125 Benign neoplasm of sigmoid colon: Secondary | ICD-10-CM | POA: Diagnosis not present

## 2022-07-24 DIAGNOSIS — Z955 Presence of coronary angioplasty implant and graft: Secondary | ICD-10-CM | POA: Diagnosis not present

## 2022-07-24 DIAGNOSIS — K635 Polyp of colon: Secondary | ICD-10-CM | POA: Diagnosis not present

## 2022-07-24 DIAGNOSIS — D122 Benign neoplasm of ascending colon: Secondary | ICD-10-CM

## 2022-07-24 HISTORY — PX: SAVORY DILATION: SHX5439

## 2022-07-24 HISTORY — PX: POLYPECTOMY: SHX149

## 2022-07-24 HISTORY — PX: ESOPHAGOGASTRODUODENOSCOPY (EGD) WITH PROPOFOL: SHX5813

## 2022-07-24 HISTORY — PX: COLONOSCOPY WITH PROPOFOL: SHX5780

## 2022-07-24 SURGERY — COLONOSCOPY WITH PROPOFOL
Anesthesia: General

## 2022-07-24 MED ORDER — LACTATED RINGERS IV SOLN
INTRAVENOUS | Status: DC
  Administered 2022-07-24: 1000 mL via INTRAVENOUS

## 2022-07-24 MED ORDER — PROPOFOL 500 MG/50ML IV EMUL
INTRAVENOUS | Status: DC | PRN
  Administered 2022-07-24: 200 ug/kg/min via INTRAVENOUS

## 2022-07-24 MED ORDER — LIDOCAINE 2% (20 MG/ML) 5 ML SYRINGE
INTRAMUSCULAR | Status: DC | PRN
  Administered 2022-07-24: 50 mg via INTRAVENOUS

## 2022-07-24 MED ORDER — PROPOFOL 10 MG/ML IV BOLUS
INTRAVENOUS | Status: DC | PRN
  Administered 2022-07-24: 100 mg via INTRAVENOUS
  Administered 2022-07-24: 30 mg via INTRAVENOUS

## 2022-07-24 NOTE — Discharge Instructions (Addendum)
You are being discharged to home.  Resume your previous diet.  If recurrent trouble swallowing, perform esophageal esophagram. We are waiting for your pathology results.  Your physician has recommended a repeat colonoscopy in three years for surveillance.

## 2022-07-24 NOTE — Interval H&P Note (Signed)
History and Physical Interval Note:  07/24/2022 1:30 PM  Brandon Pratt  has presented today for surgery, with the diagnosis of Screening Colonoscopy Dysphagia.  The various methods of treatment have been discussed with the patient and family. After consideration of risks, benefits and other options for treatment, the patient has consented to  Procedure(s) with comments: COLONOSCOPY WITH PROPOFOL (N/A) - 100 ASA 3 ESOPHAGOGASTRODUODENOSCOPY (EGD) WITH PROPOFOL (N/A) as a surgical intervention.  The patient's history has been reviewed, patient examined, no change in status, stable for surgery.  I have reviewed the patient's chart and labs.  Questions were answered to the patient's satisfaction.     [name]

## 2022-07-24 NOTE — Op Note (Signed)
[facility] Patient Name: Brandon Pratt Procedure Date: 07/24/2022 2:13 PM MRN: [id number] Date of Birth: [birth year] Attending MD: Maylon Peppers ,  CSN: [id number] Age: 66 Admit Type: Outpatient Procedure:                Colonoscopy Indications:              Screening for colorectal malignant neoplasm Providers:                Maylon Peppers, Crystal Page, Lambert Mody,                            Vista Mink, Technician, Ladoris Gene                            Technician, Merchant [employer] officer Referring MD:              Medicines:                Monitored Anesthesia Care Complications:            No immediate complications. Estimated Blood Loss:     Estimated blood loss: none. Procedure:                Pre-Anesthesia Assessment:                           - Prior to the procedure, a History and Physical                            was performed, and patient medications, allergies                            and sensitivities were reviewed. The patient's                            tolerance of previous anesthesia was reviewed.                           - The risks and benefits of the procedure and the                            sedation options and risks were discussed with the                            patient. All questions were answered and informed                            consent was obtained.                           - ASA Grade Assessment: II - A patient with mild                            systemic disease.                           After obtaining informed consent, the colonoscope  was passed under direct vision. Throughout the                            procedure, the patient's blood pressure, pulse, and                            oxygen saturations were monitored continuously. The                            [id number]-HQ190L ([id number]) scope was introduced through                            the anus and advanced to the the cecum, identified                             by appendiceal orifice and ileocecal valve. The                            colonoscopy was performed without difficulty. The                            patient tolerated the procedure well. The quality                            of the bowel preparation was adequate to identify                            polyps 6 mm and larger in size. Scope In: 2:16:55 PM Scope Out: 7:34:19 PM Scope Withdrawal Time: 0 hours 23 minutes 2 seconds  Total Procedure Duration: 0 hours 29 minutes 14 seconds  Findings:      The perianal and digital rectal examinations were normal.      Two sessile polyps were found in the ascending colon and cecum. The       polyps were 4 to 6 mm in size. These polyps were removed with a cold       snare. Resection and retrieval were complete.      A 2 mm polyp was found in the ascending colon. The polyp was sessile.       The polyp was removed with a cold biopsy forceps. Resection and       retrieval were complete.      Three sessile polyps were found in the sigmoid colon and descending       colon. The polyps were 4 to 6 mm in size. These polyps were removed with       a cold snare. Resection and retrieval were complete.      Non-bleeding internal hemorrhoids were found during retroflexion. The       hemorrhoids were small. Impression:               - Two 4 to 6 mm polyps in the ascending colon and                            in the cecum, removed with a cold snare. Resected  and retrieved.                           - One 2 mm polyp in the ascending colon, removed                            with a cold biopsy forceps. Resected and retrieved.                           - Three 4 to 6 mm polyps in the sigmoid colon and                            in the descending colon, removed with a cold snare.                            Resected and retrieved.                           - Non-bleeding internal hemorrhoids. Moderate Sedation:       Per Anesthesia Care Recommendation:           - Discharge patient to home (ambulatory).                           - Resume previous diet.                           - Await pathology results.                           - Repeat colonoscopy in 3 years for surveillance. Procedure Code(s):        --- Professional ---                           [phone], Colonoscopy, flexible; with removal of                            tumor(s), polyp(s), or other lesion(s) by snare                            technique                           [id number], 21, Colonoscopy, flexible; with biopsy,                            single or multiple Diagnosis Code(s):        --- Professional ---                           K63.5, Polyp of colon                           Z12.11, Encounter for screening for malignant                            neoplasm of  colon                           K64.8, Other hemorrhoids CPT copyright 2019 American Medical Association. All rights reserved. The codes documented in this report are preliminary and upon coder review may  be revised to meet current compliance requirements. Maylon Peppers, MD Maylon Peppers,  07/24/2022 2:53:00 PM This report has been signed electronically. Number of Addenda: 0

## 2022-07-24 NOTE — Interval H&P Note (Signed)
History and Physical Interval Note:  07/24/2022 1:52 PM  Brandon Pratt  has presented today for surgery, with the diagnosis of Screening Colonoscopy Dysphagia.  The various methods of treatment have been discussed with the patient and family. After consideration of risks, benefits and other options for treatment, the patient has consented to  Procedure(s) with comments: COLONOSCOPY WITH PROPOFOL (N/A) - 100 ASA 3 ESOPHAGOGASTRODUODENOSCOPY (EGD) WITH PROPOFOL (N/A) as a surgical intervention.  The patient's history has been reviewed, patient examined, no change in status, stable for surgery.  I have reviewed the patient's chart and labs.  Questions were answered to the patient's satisfaction.     [name]

## 2022-07-24 NOTE — Transfer of Care (Signed)
Immediate Anesthesia Transfer of Care Note  Patient: Brandon Pratt  Procedure(s) Performed: COLONOSCOPY WITH PROPOFOL ESOPHAGOGASTRODUODENOSCOPY (EGD) WITH PROPOFOL SAVORY DILATION POLYPECTOMY INTESTINAL  Patient Location: Short Stay  Anesthesia Type:General  Level of Consciousness: awake  Airway & Oxygen Therapy: Patient Spontanous Breathing  Post-op Assessment: Report given to RN and Post -op Vital signs reviewed and stable  Post vital signs: Reviewed and Stable   Last Vitals:  Vitals Value Taken Time  BP 141/72 07/24/22 1450  Temp 36.8 C 07/24/22 1450  Pulse 86 07/24/22 1450  Resp 18 07/24/22 1450  SpO2 98 % 07/24/22 1450    Last Pain:  Vitals:   07/24/22 1450  TempSrc: Oral  PainSc:       Patients Stated Pain Goal: 8 (04/24/90 0289)  Complications: No notable events documented.

## 2022-07-24 NOTE — Op Note (Signed)
[facility] Patient Name: Brandon Pratt Procedure Date: 07/24/2022 1:56 PM MRN: [id number] Date of Birth: Apr 28, 1956 Attending MD: Maylon Peppers ,  CSN: [id number] Age: 66 Admit Type: Outpatient Procedure:                Upper GI endoscopy Indications:              Dysphagia Providers:                Maylon Peppers, Crystal Page, Lambert Mody,                            Kristine L. Risa Grill, Technician, Bethel Born, Merchant navy officer Referring MD:              Medicines:                Monitored Anesthesia Care Complications:            No immediate complications. Estimated Blood Loss:     Estimated blood loss: none. Procedure:                Pre-Anesthesia Assessment:                           - Prior to the procedure, a History and Physical                            was performed, and patient medications, allergies                            and sensitivities were reviewed. The patient's                            tolerance of previous anesthesia was reviewed.                           - The risks and benefits of the procedure and the                            sedation options and risks were discussed with the                            patient. All questions were answered and informed                            consent was obtained.                           - ASA Grade Assessment: II - A patient with mild                            systemic disease.                           After obtaining informed consent, the endoscope was  passed under direct vision. Throughout the                            procedure, the patient's blood pressure, pulse, and                            oxygen saturations were monitored continuously. The                            [id number]-[id number] ([id number]) scope was introduced through the                            mouth, and advanced to the second part of duodenum.                             The upper GI endoscopy was accomplished without                            difficulty. The patient tolerated the procedure                            well. Scope In: 2:06:19 PM Scope Out: 2:11:55 PM Total Procedure Duration: 0 hours 5 minutes 36 seconds  Findings:      No endoscopic abnormality was evident in the esophagus to explain the       patient's complaint of dysphagia. It was decided, however, to proceed       with dilation of the entire esophagus. A guidewire was placed and the       scope was withdrawn. Dilation was performed with a Savary dilator with       no resistance at 18 mm. No mucosal disruption was seen upon reinspection.      A 1 cm hiatal hernia was present.      The entire examined stomach was normal.      The examined duodenum was normal. Impression:               - No endoscopic esophageal abnormality to explain                            patient's dysphagia. Esophagus dilated. Dilated.                           - 1 cm hiatal hernia.                           - Normal stomach.                           - Normal examined duodenum.                           - No specimens collected. Moderate Sedation:      Per Anesthesia Care Recommendation:           - Discharge patient to home (ambulatory).                           -  Resume previous diet.                           - If recurrent dysphagia, perform esophageal                            esophagram. Procedure Code(s):        --- Professional ---                           [phone], Esophagogastroduodenoscopy, flexible,                            transoral; with insertion of guide wire followed by                            passage of dilator(s) through esophagus over guide                            wire Diagnosis Code(s):        --- Professional ---                           R13.10, Dysphagia, unspecified                           K44.9, Diaphragmatic hernia without obstruction or                             gangrene CPT copyright 2019 American Medical Association. All rights reserved. The codes documented in this report are preliminary and upon coder review may  be revised to meet current compliance requirements. Maylon Peppers, MD Maylon Peppers,  07/24/2022 2:15:00 PM This report has been signed electronically. Number of Addenda: 0

## 2022-07-24 NOTE — Anesthesia Preprocedure Evaluation (Signed)
Anesthesia Evaluation  Patient identified by MRN, date of birth, ID band Patient awake    Reviewed: Allergy & Precautions, H&P , NPO status , Patient's Chart, lab work & pertinent test results, reviewed documented beta blocker date and time   Airway Mallampati: III  TM Distance: >3 FB Neck ROM: full    Dental no notable dental hx. (+) Edentulous Upper, Edentulous Lower   Pulmonary neg pulmonary ROS, Current Smoker,    Pulmonary exam normal breath sounds clear to auscultation       Cardiovascular Exercise Tolerance: Good hypertension, + CAD and + Cardiac Stents   Rhythm:regular Rate:Normal     Neuro/Psych PSYCHIATRIC DISORDERS Anxiety Depression negative neurological ROS     GI/Hepatic Neg liver ROS, hiatal hernia, GERD  Medicated,  Endo/Other  negative endocrine ROS  Renal/GU negative Renal ROS  negative genitourinary   Musculoskeletal   Abdominal   Peds  Hematology negative hematology ROS (+)   Anesthesia Other Findings   Reproductive/Obstetrics negative OB ROS                             Anesthesia Physical Anesthesia Plan  ASA: 3  Anesthesia Plan: General   Post-op Pain Management:    Induction:   PONV Risk Score and Plan: Propofol infusion  Airway Management Planned:   Additional Equipment:   Intra-op Plan:   Post-operative Plan:   Informed Consent: I have reviewed the patients History and Physical, chart, labs and discussed the procedure including the risks, benefits and alternatives for the proposed anesthesia with the patient or authorized representative who has indicated his/her understanding and acceptance.     Dental Advisory Given  Plan Discussed with: CRNA  Anesthesia Plan Comments:         Anesthesia Quick Evaluation

## 2022-07-26 NOTE — Anesthesia Postprocedure Evaluation (Signed)
Anesthesia Post Note  Patient: Brandon Pratt  Procedure(s) Performed: COLONOSCOPY WITH PROPOFOL ESOPHAGOGASTRODUODENOSCOPY (EGD) WITH PROPOFOL SAVORY DILATION POLYPECTOMY INTESTINAL  Patient location during evaluation: Phase II Anesthesia Type: General Level of consciousness: awake Pain management: pain level controlled Vital Signs Assessment: post-procedure vital signs reviewed and stable Respiratory status: spontaneous breathing and respiratory function stable Cardiovascular status: blood pressure returned to baseline and stable Postop Assessment: no headache and no apparent nausea or vomiting Anesthetic complications: no Comments: Late entry   No notable events documented.   Last Vitals:  Vitals:   07/24/22 1323 07/24/22 1450  BP: (!) 162/104 (!) 141/72  Pulse: 69 86  Resp: 19 18  Temp: (!) 36.4 C 36.8 C  SpO2: 98% 98%    Last Pain:  Vitals:   07/25/22 1058  TempSrc:   PainSc: 0-No pain                 Louann Sjogren

## 2022-07-28 LAB — SURGICAL PATHOLOGY

## 2022-07-31 ENCOUNTER — Encounter (HOSPITAL_COMMUNITY): Payer: Self-pay | Admitting: Gastroenterology

## 2022-08-07 ENCOUNTER — Ambulatory Visit: Payer: Medicare Other | Attending: Cardiology | Admitting: Cardiology

## 2022-08-07 ENCOUNTER — Encounter: Payer: Self-pay | Admitting: Cardiology

## 2022-08-07 VITALS — BP 132/86 | HR 75 | Ht 70.0 in | Wt 227.0 lb

## 2022-08-07 DIAGNOSIS — E782 Mixed hyperlipidemia: Secondary | ICD-10-CM | POA: Diagnosis not present

## 2022-08-07 DIAGNOSIS — I25119 Atherosclerotic heart disease of native coronary artery with unspecified angina pectoris: Secondary | ICD-10-CM

## 2022-08-07 MED ORDER — NITROGLYCERIN 0.4 MG SL SUBL: 0.4000 mg | SUBLINGUAL_TABLET | SUBLINGUAL | 3 refills | Status: AC | PRN

## 2022-08-07 NOTE — Progress Notes (Signed)
Cardiology Office Note  Date: 08/07/2022   ID: Brandon Pratt, DOB 1956-04-20, MRN [id number]  PCP:  Celene Squibb, MD  Cardiologist:  Rozann Lesches, MD Electrophysiologist:  None   Chief Complaint  Patient presents with   Cardiac follow-up    History of Present Illness: Brandon Pratt is a 66 y.o. male last seen in March 2022 by Mr. Leonides Sake NP.  He is here for a follow-up visit.  He does not report any angina or nitroglycerin use in the interim.  States that he has been compliant with his medications as noted below.  We discussed maintaining a fresh refill for his nitroglycerin.  He tells me that his grandson will graduate from the [employer] next May.  He is also very proud of him.  Continues to follow with Dr. Nevada Crane for primary care, no major interval health changes.  I did review his last lab work.  Cholesterol has been fairly well controlled.  Past Medical History:  Diagnosis Date   Anxiety    Arthritis    Chronic pain syndrome    Coronary atherosclerosis of native coronary artery    Multivessel, occluded SVG to RCA 2/14 - managed medically   Depression    Essential hypertension    GERD (gastroesophageal reflux disease)    History of bronchitis 20+ yrs ago   History of hiatal hernia    Joint pain    Mixed hyperlipidemia     Past Surgical History:  Procedure Laterality Date   APPENDECTOMY     CARDIAC CATHETERIZATION  2014   CARPAL TUNNEL RELEASE Left    CERVICAL SPINE SURGERY     COLONOSCOPY     COLONOSCOPY WITH PROPOFOL N/A 07/24/2022   Procedure: COLONOSCOPY WITH PROPOFOL;  Surgeon: Harvel Quale, MD;  [facility];  Service: Gastroenterology;  Laterality: N/A;  100 ASA 3   CORONARY ARTERY BYPASS GRAFT  12/2009   LIMA to LAD, SVG to OM, SVG to diagonal, SVG to RCA   ELBOW SURGERY     "moved nerves over"   ESOPHAGOGASTRODUODENOSCOPY (EGD) WITH PROPOFOL N/A 07/24/2022   Procedure: ESOPHAGOGASTRODUODENOSCOPY (EGD) WITH PROPOFOL;   Surgeon: Harvel Quale, MD;  [facility];  Service: Gastroenterology;  Laterality: N/A;   GAS/FLUID EXCHANGE Left 03/13/2015   Procedure: GAS/FLUID EXCHANGE;  Surgeon: Hayden Pedro, MD;  Location: [place];  Service: Ophthalmology;  Laterality: Left;   GAS/FLUID EXCHANGE Left 04/08/2016   Procedure: GAS/FLUID EXCHANGE;  Surgeon: Hayden Pedro, MD;  Location: [place];  Service: Ophthalmology;  Laterality: Left;  C3F8   HAND SURGERY Right    carpatal tunnel x2   LASER PHOTO ABLATION Left 03/13/2015   Procedure: LASER PHOTO ABLATION;  Surgeon: Hayden Pedro, MD;  Location: [place];  Service: Ophthalmology;  Laterality: Left;  Endolaser   LASER PHOTO ABLATION Left 04/08/2016   Procedure: LASER PHOTO ABLATION;  Surgeon: Hayden Pedro, MD;  Location: [place];  Service: Ophthalmology;  Laterality: Left;  Headscope laser   LEFT SHOULDER SURGERY     MEMBRANE PEEL Left 03/13/2015   Procedure: MEMBRANE PEEL;  Surgeon: Hayden Pedro, MD;  Location: [place];  Service: Ophthalmology;  Laterality: Left;   PARS PLANA VITRECTOMY Left 03/13/2015   Procedure: PARS PLANA VITRECTOMY WITH 25 GAUGE;  Surgeon: Hayden Pedro, MD;  Location: [place];  Service: Ophthalmology;  Laterality: Left;   POLYPECTOMY  07/24/2022   Procedure: POLYPECTOMY INTESTINAL;  Surgeon: Montez Morita,  Quillian Quince, MD;  [facility];  Service: Gastroenterology;;   RIGHT SHOULDER SURGERY     SAVORY DILATION  07/24/2022   Procedure: SAVORY DILATION;  Surgeon: Harvel Quale, MD;  [facility];  Service: Gastroenterology;;   SCLERAL BUCKLE Left 04/08/2016   Procedure: SCLERAL BUCKLE;  Surgeon: Hayden Pedro, MD;  Location: [place];  Service: Ophthalmology;  Laterality: Left;    Current Outpatient Medications  Medication Sig Dispense Refill   Ascorbic Acid (VITAMIN C) 100 MG tablet Take 100 mg by mouth daily.     aspirin EC 81 MG tablet Take 1 tablet (81 mg total) by mouth daily.     BEVESPI  AEROSPHERE 9-4.8 MCG/ACT AERO Inhale 2 puffs into the lungs daily as needed (Shortness of breath).     finasteride (PROSCAR) 5 MG tablet Take 5 mg by mouth daily.     lisinopril (ZESTRIL) 40 MG tablet Take 1 tablet (40 mg total) by mouth daily. 90 tablet 3   LORazepam (ATIVAN) 1 MG tablet Take 1 mg by mouth every 8 (eight) hours as needed for anxiety.     meloxicam (MOBIC) 7.5 MG tablet Take 7.5 mg by mouth daily.     Multiple Vitamin (MULTIVITAMIN) tablet Take 1 tablet by mouth daily. Adult     omeprazole (PRILOSEC) 20 MG capsule Take 20 mg by mouth every morning.      OVER THE COUNTER MEDICATION Balance of nature whole produce fruit and veggies dietary supplement - takes 3 per day     OVER THE COUNTER MEDICATION Take 2 tablets by mouth daily. CBD gummy     pravastatin (PRAVACHOL) 40 MG tablet Take 40 mg by mouth every evening.      tamsulosin (FLOMAX) 0.4 MG CAPS capsule Take 0.4 mg by mouth daily.     triamcinolone ointment (KENALOG) 0.1 % Apply 1 Application topically daily as needed (Red spot).     nitroGLYCERIN (NITROSTAT) 0.4 MG SL tablet Place 1 tablet (0.4 mg total) under the tongue every 5 (five) minutes x 3 doses as needed for chest pain. 25 tablet 3   No current facility-administered medications for this visit.   Allergies:  Codeine   ROS:  No syncope.  Physical Exam: VS:  BP 132/86   Pulse 75   Ht '5\' 10"'$  (1.778 m)   Wt 227 lb (103 kg)   SpO2 91%   BMI 32.57 kg/m , BMI Body mass index is 32.57 kg/m.  Wt Readings from Last 3 Encounters:  08/07/22 227 lb (103 kg)  07/24/22 220 lb 0.3 oz (99.8 kg)  07/24/22 220 lb (99.8 kg)    General: Patient appears comfortable at rest. HEENT: Conjunctiva and lids normal. Neck: Supple, no elevated JVP or carotid bruits, no thyromegaly. Lungs: Clear to auscultation, nonlabored breathing at rest. Cardiac: Regular rate and rhythm, no S3, 2/6 systolic murmur. Extremities: No pitting edema.  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  Recent Labwork:  March 2022: Cholesterol 129, triglycerides 133, HDL 35, LDL 70, BUN 4, creatinine 0.84, potassium 4.0, AST 45, ALT 34, hemoglobin 16.8, platelets 248, magnesium 1.6  Other Studies Reviewed Today:  Carotid Dopplers 01/07/2017: 1-39% bilateral ICA stenoses.   Cardiac catheterization February 2014: Native vessel CAD with patent LIMA to LAD, patent SVG to OM, patent SVG to diagonal, and occluded SVG to RCA, with a chronically occluded RCA associated  with left to right collaterals.  Assessment and Plan:  1.  Multivessel CAD status post CABG with graft disease, occlusion of the SVG to RCA and occluded native RCA with associated left-to-right collaterals.  He reports good angina control on medical therapy, no recent nitroglycerin use.  Continue aspirin, lisinopril, Pravachol, and has not nitroglycerin.  2.  Mixed hyperlipidemia on Pravachol, last LDL 70.  Medication Adjustments/Labs and Tests Ordered: Current medicines are reviewed at length with the patient today.  Concerns regarding medicines are outlined above.   Tests Ordered: No orders of the defined types were placed in this encounter.   Medication Changes: Meds ordered this encounter  Medications   nitroGLYCERIN (NITROSTAT) 0.4 MG SL tablet    Sig: Place 1 tablet (0.4 mg total) under the tongue every 5 (five) minutes x 3 doses as needed for chest pain.    Dispense:  25 tablet    Refill:  3    Disposition:  Follow up  1 year, sooner if needed.  Signed, Satira Sark, MD, [facility] 08/07/2022 4:25 PM    [facility] [place], [place], [place] [address] Phone: [phone]; Fax: [phone]

## 2022-08-07 NOTE — Patient Instructions (Signed)
Medication Instructions:  Your physician recommends that you continue on your current medications as directed. Please refer to the Current Medication list given to you today.   Labwork: none  Testing/Procedures: none  Follow-Up:  Your physician recommends that you schedule a follow-up appointment in: 1 year   Any Other Special Instructions Will Be Listed Below (If Applicable).  You will receive a call in about 10 months reminding you to schedule your appointment. If you do not receive this call, please contact our office.  If you need a refill on your cardiac medications before your next appointment, please call your pharmacy.  

## 2022-08-18 ENCOUNTER — Encounter: Payer: Self-pay | Admitting: Urology

## 2022-08-18 ENCOUNTER — Ambulatory Visit: Payer: Medicare Other | Admitting: Urology

## 2022-08-18 VITALS — BP 161/86 | HR 82

## 2022-08-18 DIAGNOSIS — N401 Enlarged prostate with lower urinary tract symptoms: Secondary | ICD-10-CM

## 2022-08-18 DIAGNOSIS — N3941 Urge incontinence: Secondary | ICD-10-CM

## 2022-08-18 LAB — BLADDER SCAN AMB NON-IMAGING: Scan Result: 22

## 2022-08-18 NOTE — Progress Notes (Signed)
post void residual=22 

## 2022-08-18 NOTE — Progress Notes (Signed)
08/18/2022 1:11 PM   Brandon Pratt 11/08/1956 [id number]  Referring provider: Celene Squibb, MD [address] [place],  [place] [id number]  No chief complaint on file.   HPI:  F/u -    1) BPH, LUTS - he is on tamsulosin and finasteride. Was on a daily pde5i. No surgery. He voids with a good stream. He has urgency and UUI. Daytime diapers x 2-3. Sometimes enuresis. He has PN. AUASS = 18-19. Urgency most predominate.    His May 2023 PSA was 1.2 (2.4) and Cr 0.92. CT in 2013 with 1.6 cm LUP cyst, "unremarkable" prostate.    He did not void. PVR 19 ml. He drinks a lot of Dr. [name]. 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 He has constipation.   Today, seen for the above. I recommended he cut back on Dr [name] and increase water intake. He hasn't been able to do that. He has foot on the floor urgency and UUI. Usually a good stream. Leaks day and night. He couldn't leak a specimen. Bladder scan 22 ml. AUASS = 18.    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   PMH: Past Medical History:  Diagnosis Date   Anxiety    Arthritis    Chronic pain syndrome    Coronary atherosclerosis of native coronary artery    Multivessel, occluded SVG to RCA 2/14 - managed medically   Depression    Essential hypertension    GERD (gastroesophageal reflux disease)    History of bronchitis 20+ yrs ago   History of hiatal hernia    Joint pain    Mixed hyperlipidemia     Surgical History: Past Surgical History:  Procedure Laterality Date   APPENDECTOMY     CARDIAC CATHETERIZATION  2014   CARPAL TUNNEL RELEASE Left    CERVICAL SPINE SURGERY     COLONOSCOPY     COLONOSCOPY WITH PROPOFOL N/A 07/24/2022   Procedure: COLONOSCOPY WITH PROPOFOL;  Surgeon: Harvel Quale, MD;  [facility];  Service: Gastroenterology;  Laterality: N/A;  100 ASA 3   CORONARY ARTERY BYPASS GRAFT  12/2009   LIMA to LAD, SVG to OM, SVG to diagonal, SVG to RCA   ELBOW SURGERY     "moved  nerves over"   ESOPHAGOGASTRODUODENOSCOPY (EGD) WITH PROPOFOL N/A 07/24/2022   Procedure: ESOPHAGOGASTRODUODENOSCOPY (EGD) WITH PROPOFOL;  Surgeon: Harvel Quale, MD;  [facility];  Service: Gastroenterology;  Laterality: N/A;   GAS/FLUID EXCHANGE Left 03/13/2015   Procedure: GAS/FLUID EXCHANGE;  Surgeon: Hayden Pedro, MD;  Location: [place];  Service: Ophthalmology;  Laterality: Left;   GAS/FLUID EXCHANGE Left 04/08/2016   Procedure: GAS/FLUID EXCHANGE;  Surgeon: Hayden Pedro, MD;  Location: [place];  Service: Ophthalmology;  Laterality: Left;  C3F8   HAND SURGERY Right    carpatal tunnel x2   LASER PHOTO ABLATION Left 03/13/2015   Procedure: LASER PHOTO ABLATION;  Surgeon: Hayden Pedro, MD;  Location: [place];  Service: Ophthalmology;  Laterality: Left;  Endolaser   LASER PHOTO ABLATION Left 04/08/2016   Procedure: LASER PHOTO ABLATION;  Surgeon: Hayden Pedro, MD;  Location: [place];  Service: Ophthalmology;  Laterality: Left;  Headscope laser   LEFT SHOULDER SURGERY     MEMBRANE PEEL Left 03/13/2015   Procedure: MEMBRANE PEEL;  Surgeon: Hayden Pedro, MD;  Location: [place];  Service: Ophthalmology;  Laterality: Left;   PARS PLANA  VITRECTOMY Left 03/13/2015   Procedure: PARS PLANA VITRECTOMY WITH 25 GAUGE;  Surgeon: Hayden Pedro, MD;  Location: [place];  Service: Ophthalmology;  Laterality: Left;   POLYPECTOMY  07/24/2022   Procedure: POLYPECTOMY INTESTINAL;  Surgeon: Harvel Quale, MD;  [facility];  Service: Gastroenterology;;   RIGHT SHOULDER SURGERY     SAVORY DILATION  07/24/2022   Procedure: SAVORY DILATION;  Surgeon: Harvel Quale, MD;  [facility];  Service: Gastroenterology;;   SCLERAL BUCKLE Left 04/08/2016   Procedure: SCLERAL BUCKLE;  Surgeon: Hayden Pedro, MD;  Location: [place];  Service: Ophthalmology;  Laterality: Left;    Home Medications:  Allergies as of 08/18/2022       Reactions   Codeine Nausea  Only   Upset stomach        Medication List        Accurate as of August 18, 2022  1:11 PM. If you have any questions, ask your nurse or doctor.          aspirin EC 81 MG tablet Take 1 tablet (81 mg total) by mouth daily.   Bevespi Aerosphere 9-4.8 MCG/ACT Aero Generic drug: Glycopyrrolate-Formoterol Inhale 2 puffs into the lungs daily as needed (Shortness of breath).   finasteride 5 MG tablet Commonly known as: PROSCAR Take 5 mg by mouth daily.   lisinopril 40 MG tablet Commonly known as: ZESTRIL Take 1 tablet (40 mg total) by mouth daily.   LORazepam 1 MG tablet Commonly known as: ATIVAN Take 1 mg by mouth every 8 (eight) hours as needed for anxiety.   meloxicam 7.5 MG tablet Commonly known as: MOBIC Take 7.5 mg by mouth daily.   multivitamin tablet Take 1 tablet by mouth daily. Adult   nitroGLYCERIN 0.4 MG SL tablet Commonly known as: NITROSTAT Place 1 tablet (0.4 mg total) under the tongue every 5 (five) minutes x 3 doses as needed for chest pain.   omeprazole 20 MG capsule Commonly known as: PRILOSEC Take 20 mg by mouth every morning.   OVER THE COUNTER MEDICATION Balance of nature whole produce fruit and veggies dietary supplement - takes 3 per day   OVER THE COUNTER MEDICATION Take 2 tablets by mouth daily. CBD gummy   pravastatin 40 MG tablet Commonly known as: PRAVACHOL Take 40 mg by mouth every evening.   tamsulosin 0.4 MG Caps capsule Commonly known as: FLOMAX Take 0.4 mg by mouth daily.   triamcinolone ointment 0.1 % Commonly known as: KENALOG Apply 1 Application topically daily as needed (Red spot).   vitamin C 100 MG tablet Take 100 mg by mouth daily.        Allergies:  Allergies  Allergen Reactions   Codeine Nausea Only    Upset stomach    Family History: Family History  Problem Relation Age of Onset   Hypertension Mother    Diabetes Mother    Other Mother        Pacemaker   Stroke Father     Social History:   reports that he has been smoking cigarettes. He started smoking about 62 years ago. He has a 50.00 pack-year smoking history. He has been exposed to tobacco smoke. He quit smokeless tobacco use about 53 years ago.  His smokeless tobacco use included chew. He reports current alcohol use. He reports current drug use. Drug: Marijuana.   Physical Exam: There were no vitals taken for this visit.  Constitutional:  Alert and oriented, No acute distress.  HEENT: [place] AT, moist mucus membranes.  Trachea midline, no masses. Cardiovascular: No clubbing, cyanosis, or edema. Respiratory: Normal respiratory effort, no increased work of breathing. GI: Abdomen is soft, nontender, nondistended, no abdominal masses GU: No CVA tenderness Skin: No rashes, bruises or suspicious lesions. Neurologic: Grossly intact, no focal deficits, moving all 4 extremities. Psychiatric: Normal mood and affect.  Laboratory Data: Lab Results  Component Value Date   WBC 13.6 (H) 02/24/2018   HGB 16.0 02/24/2018   HCT 47.3 02/24/2018   MCV 96.9 02/24/2018   PLT 204 02/24/2018    Lab Results  Component Value Date   CREATININE 0.77 02/24/2018    No results found for: "PSA"  No results found for: "TESTOSTERONE"  Lab Results  Component Value Date   HGBA1C (H) 12/20/2009    6.4 (NOTE) The ADA recommends the following therapeutic goal for glycemic control related to Hgb A1c measurement: Goal of therapy: <6.5 Hgb A1c  Reference: American Diabetes Association: Clinical Practice Recommendations 2010, Diabetes Care, 2010, 33: (Suppl  1).    Urinalysis    Component Value Date/Time   COLORURINE YELLOW 12/20/2009 0937   APPEARANCEUR CLOUDY (A) 12/20/2009 0937   LABSPEC 1.012 12/20/2009 0937   PHURINE 6.5 12/20/2009 [place] 12/20/2009 0937   HGBUR NEGATIVE 12/20/2009 [place] 12/20/2009 [place] 12/20/2009 0937   PROTEINUR NEGATIVE 12/20/2009 0937   UROBILINOGEN 1.0  12/20/2009 0937   NITRITE NEGATIVE 12/20/2009 0937   LEUKOCYTESUR TRACE (A) 12/20/2009 0937    No results found for: "LABMICR", "WBCUA", "RBCUA", "LABEPIT", "MUCUS", "BACTERIA"  Pertinent Imaging: N/a   Assessment & Plan:    1. Urge incontinence of urine Check UDS in [place] and then follow-up here for cystoscopy.  - BLADDER SCAN AMB NON-IMAGING - Urinalysis, Routine w reflex microscopic  2. Benign prostatic hyperplasia with lower urinary tract symptoms, symptom details unspecified Cont tams and 5ari.   - BLADDER SCAN AMB NON-IMAGING - Urinalysis, Routine w reflex microscopic   No follow-ups on file.  Festus Aloe, MD  [facility]  [address] [place], [place] [address] [phone]

## 2022-09-25 ENCOUNTER — Ambulatory Visit (INDEPENDENT_AMBULATORY_CARE_PROVIDER_SITE_OTHER): Payer: Medicare Other | Admitting: Gastroenterology

## 2022-09-25 ENCOUNTER — Encounter (INDEPENDENT_AMBULATORY_CARE_PROVIDER_SITE_OTHER): Payer: Self-pay | Admitting: Gastroenterology

## 2022-10-06 ENCOUNTER — Encounter: Payer: Self-pay | Admitting: Urology

## 2022-10-06 ENCOUNTER — Ambulatory Visit: Payer: Medicare Other | Admitting: Urology

## 2022-10-06 VITALS — BP 164/108 | HR 75

## 2022-10-06 DIAGNOSIS — N3941 Urge incontinence: Secondary | ICD-10-CM | POA: Diagnosis not present

## 2022-10-06 MED ORDER — CIPROFLOXACIN HCL 500 MG PO TABS
500.0000 mg | ORAL_TABLET | Freq: Once | ORAL | Status: AC
  Administered 2022-10-06: 500 mg via ORAL

## 2022-10-06 MED ORDER — TROSPIUM CHLORIDE 20 MG PO TABS: 20.0000 mg | ORAL_TABLET | Freq: Two times a day (BID) | ORAL | 11 refills | Status: DC

## 2022-10-06 NOTE — Progress Notes (Signed)
Patient unable to void 

## 2022-10-06 NOTE — Progress Notes (Signed)
10/06/2022 11:15 AM   Brandon Pratt [birth year] [id number]  Referring provider: Celene Squibb, MD [address] [place] Pratt,  [place] Pr[place] [id number]  No chief complaint on file.   HPI:  F/u -    1) BPH, LUTS - he is on tamsulosin and finasteride. Was on a daily pde5i. No surgery. He voids with a good stream. He has urgency and UUI. Daytime diapers x 2-3. Sometimes enuresis. He has PN. AUASS = 18-19. Urgency most predominate.    His May 2023 PSA was 1.2 (2.4) and Cr 0.92. CT in 2013 with 1.6 cm LUP cyst, "unremarkable" prostate.    He did not void. PVR 19 ml. He drinks a lot of Dr. [name]. 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 He has constipation. I recommended he cut back on Dr [name] and increase water intake. He hasn't been able to do that. He has foot on the floor urgency and UUI. Usually a good stream. Leaks day and night. Bladder scan 22 ml. AUASS = 18.   He returns following UDS to review, for cystoscopy and medication management. Reports they could not get the line in a it kept "bending". His Oct 2023 UDS showed pvr 50, capacity 212, sensation at 42, desire to void at 113. His bladder was unstable and he voided off this contraction but the UDS line was expelled.   Discussed UDS and need for cystoscopy. He is well today without dysuria. Cysto today with lateral lobe hypertrophy, borderline obs and no median lobe. He only held about 150 ml water and then voided around the scope.   PMH: Past Medical History:  Diagnosis Date   Anxiety    Arthritis    Chronic pain syndrome    Coronary atherosclerosis of native coronary artery    Multivessel, occluded SVG to RCA 2/14 - managed medically   Depression    Essential hypertension    GERD (gastroesophageal reflux disease)    History of bronchitis 20+ yrs ago   History of hiatal hernia    Joint pain    Mixed hyperlipidemia     Surgical History: Past Surgical History:  Procedure Laterality Date   APPENDECTOMY     CARDIAC  CATHETERIZATION  2014   CARPAL TUNNEL RELEASE Left    CERVICAL SPINE SURGERY     COLONOSCOPY     COLONOSCOPY WITH PROPOFOL N/A 07/24/2022   Procedure: COLONOSCOPY WITH PROPOFOL;  Surgeon: Brandon Quale, MD;  [facility];  Service: Gastroenterology;  Laterality: N/A;  100 ASA 3   CORONARY ARTERY BYPASS GRAFT  12/2009   LIMA to LAD, SVG to OM, SVG to diagonal, SVG to RCA   ELBOW SURGERY     "moved nerves over"   ESOPHAGOGASTRODUODENOSCOPY (EGD) WITH PROPOFOL N/A 07/24/2022   Procedure: ESOPHAGOGASTRODUODENOSCOPY (EGD) WITH PROPOFOL;  Surgeon: Brandon Quale, MD;  [facility];  Service: Gastroenterology;  Laterality: N/A;   GAS/FLUID EXCHANGE Left 03/13/2015   Procedure: GAS/FLUID EXCHANGE;  Surgeon: Brandon Pedro, MD;  Location: [place];  Service: Ophthalmology;  Laterality: Left;   GAS/FLUID EXCHANGE Left 04/08/2016   Procedure: GAS/FLUID EXCHANGE;  Surgeon: Brandon Pedro, MD;  Location: [place];  Service: Ophthalmology;  Laterality: Left;  C3F8   HAND SURGERY Right    carpatal tunnel x2   LASER PHOTO ABLATION Left 03/13/2015   Procedure: LASER PHOTO ABLATION;  Surgeon: Brandon Pedro, MD;  Location: [place];  Service: Ophthalmology;  Laterality: Left;  Endolaser   LASER PHOTO ABLATION Left 04/08/2016   Procedure: LASER PHOTO ABLATION;  Surgeon: Brandon Pedro, MD;  Location[facility];  Service: Ophthalmology;  Laterality: Left;  Headscope laser   LEFT SHOULDER SURGERY     MEMBRANE PEEL Left 03/13/2015   Procedure: MEMBRANE PEEL;  Surgeon: Brandon Pedro, MD;  Location: [place];  Service: Ophthalmology;  Laterality: Left;   PARS PLANA VITRECTOMY Left 03/13/2015   Procedure: PARS PLANA VITRECTOMY WITH 25 GAUGE;  Surgeon: Brandon Pedro, MD;  Location: [place];  Service: Ophthalmology;  Laterality: Left;   POLYPECTOMY  07/24/2022   Procedure: POLYPECTOMY INTESTINAL;  Surgeon: Brandon Quale, MD;  [facility];  Service: Gastroenterology;;    RIGHT SHOULDER SURGERY     SAVORY DILATION  07/24/2022   Procedure: SAVORY DILATION;  Surgeon: Brandon Quale, MD;  [facility];  Service: Gastroenterology;;   SCLERAL BUCKLE Left 04/08/2016   Procedure: SCLERAL BUCKLE;  Surgeon: Brandon Pedro, MD;  Location: [place];  Service: Ophthalmology;  Laterality: Left;    Home Medications:  Allergies as of 10/06/2022       Reactions   Codeine Nausea Only   Upset stomach        Medication List        Accurate as of October 06, 2022 11:15 AM. If you have any questions, ask your nurse or doctor.          aspirin EC 81 MG tablet Take 1 tablet (81 mg total) by mouth daily.   Bevespi Aerosphere 9-4.8 MCG/ACT Aero Generic drug: Glycopyrrolate-Formoterol Inhale 2 puffs into the lungs daily as needed (Shortness of breath).   finasteride 5 MG tablet Commonly known as: PROSCAR Take 5 mg by mouth daily.   lisinopril 40 MG tablet Commonly known as: ZESTRIL Take 1 tablet (40 mg total) by mouth daily.   LORazepam 1 MG tablet Commonly known as: ATIVAN Take 1 mg by mouth every 8 (eight) hours as needed for anxiety.   meloxicam 7.5 MG tablet Commonly known as: MOBIC Take 7.5 mg by mouth daily.   multivitamin tablet Take 1 tablet by mouth daily. Adult   nitroGLYCERIN 0.4 MG SL tablet Commonly known as: NITROSTAT Place 1 tablet (0.4 mg total) under the tongue every 5 (five) minutes x 3 doses as needed for chest pain.   omeprazole 20 MG capsule Commonly known as: PRILOSEC Take 20 mg by mouth every morning.   OVER THE COUNTER MEDICATION Balance of nature whole produce fruit and veggies dietary supplement - takes 3 per day   OVER THE COUNTER MEDICATION Take 2 tablets by mouth daily. CBD gummy   pravastatin 40 MG tablet Commonly known as: PRAVACHOL Take 40 mg by mouth every evening.   tamsulosin 0.4 MG Caps capsule Commonly known as: FLOMAX Take 0.4 mg by mouth daily.   triamcinolone ointment 0.1  % Commonly known as: KENALOG Apply 1 Application topically daily as needed (Red spot).   vitamin C 100 MG tablet Take 100 mg by mouth daily.        Allergies:  Allergies  Allergen Reactions   Codeine Nausea Only    Upset stomach    Family History: Family History  Problem Relation Age of Onset   Hypertension Mother    Diabetes Mother    Other Mother        Pacemaker   Stroke Father     Social History:  reports that he has  been smoking cigarettes. He started smoking about 62 years ago. He has a 50.00 pack-year smoking history. He has been exposed to tobacco smoke. He quit smokeless tobacco use about 53 years ago.  His smokeless tobacco use included chew. He reports current alcohol use. He reports current drug use. Drug: Marijuana.   Blood pressure (!) 164/108, pulse 75. NED. A&Ox3.   No respiratory distress   Abd soft, NT, ND Normal phallus with bilateral descended testicles Meatus and glans normal  Cystoscopy Procedure Note  Patient identification was confirmed, informed consent was obtained, and patient was prepped using Betadine solution.  Lidocaine jelly was administered per urethral meatus.     Pre-Procedure: - Inspection reveals a normal caliber ureteral meatus.  Procedure: The flexible cystoscope was introduced without difficulty - No urethral strictures/lesions are present. - moderate hyperplasia with borderline lateral lobe obstruction  - normal bladder neck - Bilateral ureteral orifices identified - Bladder mucosa  reveals no ulcers, tumors, or lesions - No bladder stones - No trabeculation  Retroflexion shows normal BN and prostate. No sig median lobe. Filled with 150 cc and he voided around the scope.    Post-Procedure: - Patient tolerated the procedure well   Laboratory Data: Lab Results  Component Value Date   WBC 13.6 (H) 02/24/2018   HGB 16.0 02/24/2018   HCT 47.3 02/24/2018   MCV 96.9 02/24/2018   PLT 204 02/24/2018    Lab  Results  Component Value Date   CREATININE 0.77 02/24/2018    No results found for: "PSA"  No results found for: "TESTOSTERONE"  Lab Results  Component Value Date   HGBA1C (H) 12/20/2009    6.4 (NOTE) The ADA recommends the following therapeutic goal for glycemic control related to Hgb A1c measurement: Goal of therapy: <6.5 Hgb A1c  Reference: American Diabetes Association: Clinical Practice Recommendations 2010, Diabetes Care, 2010, 33: (Suppl  1).    Urinalysis    Component Value Date/Time   COLORURINE YELLOW 12/20/2009 0937   APPEARANCEUR CLOUDY (A) 12/20/2009 0937   LABSPEC 1.012 12/20/2009 0937   PHURINE 6.5 12/20/2009 [place] 12/20/2009 0937   HGBUR NEGATIVE 12/20/2009 [place] 12/20/2009 [place] 12/20/2009 0937   PROTEINUR NEGATIVE 12/20/2009 0937   UROBILINOGEN 1.0 12/20/2009 0937   NITRITE NEGATIVE 12/20/2009 0937   LEUKOCYTESUR TRACE (A) 12/20/2009 0937    No results found for: "LABMICR", "WBCUA", "RBCUA", "LABEPIT", "MUCUS", "BACTERIA"  Pertinent imaging: Cystogram/UDS tracing - Oct 2023   Assessment & Plan:    1. Urge incontinence of urine - We discussed combination OAB med with anticholinergic and Myrbetriq or Gemtesa. I sent a Rx for sanctura 20 mg BID and we will layer in beta 3 if needed.   2. BPH - cont alpha blocker and 5ari for now. Disc outlet procedures - expectation for flow and storage symptoms, etc. Consider in future.   Return in about 6 weeks (around 11/17/2022).  No follow-ups on file.  Festus Aloe, MD  [facility]  [address] [place], [place] [address] [phone]

## 2022-10-10 DIAGNOSIS — E782 Mixed hyperlipidemia: Secondary | ICD-10-CM | POA: Diagnosis not present

## 2022-10-10 DIAGNOSIS — R7301 Impaired fasting glucose: Secondary | ICD-10-CM | POA: Diagnosis not present

## 2022-10-17 DIAGNOSIS — J449 Chronic obstructive pulmonary disease, unspecified: Secondary | ICD-10-CM | POA: Diagnosis not present

## 2022-10-17 DIAGNOSIS — R7301 Impaired fasting glucose: Secondary | ICD-10-CM | POA: Diagnosis not present

## 2022-10-17 DIAGNOSIS — E782 Mixed hyperlipidemia: Secondary | ICD-10-CM | POA: Diagnosis not present

## 2022-10-17 DIAGNOSIS — F17200 Nicotine dependence, unspecified, uncomplicated: Secondary | ICD-10-CM | POA: Diagnosis not present

## 2022-10-17 DIAGNOSIS — I10 Essential (primary) hypertension: Secondary | ICD-10-CM | POA: Diagnosis not present

## 2022-10-17 DIAGNOSIS — I2510 Atherosclerotic heart disease of native coronary artery without angina pectoris: Secondary | ICD-10-CM | POA: Diagnosis not present

## 2022-10-17 DIAGNOSIS — Z23 Encounter for immunization: Secondary | ICD-10-CM | POA: Diagnosis not present

## 2022-10-17 DIAGNOSIS — K219 Gastro-esophageal reflux disease without esophagitis: Secondary | ICD-10-CM | POA: Diagnosis not present

## 2022-10-17 DIAGNOSIS — I6529 Occlusion and stenosis of unspecified carotid artery: Secondary | ICD-10-CM | POA: Diagnosis not present

## 2022-11-17 ENCOUNTER — Ambulatory Visit: Payer: Medicare Other | Admitting: Urology

## 2023-01-19 DIAGNOSIS — F17200 Nicotine dependence, unspecified, uncomplicated: Secondary | ICD-10-CM | POA: Diagnosis not present

## 2023-01-19 DIAGNOSIS — J449 Chronic obstructive pulmonary disease, unspecified: Secondary | ICD-10-CM | POA: Diagnosis not present

## 2023-01-19 DIAGNOSIS — I10 Essential (primary) hypertension: Secondary | ICD-10-CM | POA: Diagnosis not present

## 2023-01-19 DIAGNOSIS — R600 Localized edema: Secondary | ICD-10-CM | POA: Diagnosis not present

## 2023-01-19 DIAGNOSIS — R109 Unspecified abdominal pain: Secondary | ICD-10-CM | POA: Diagnosis not present

## 2023-04-13 DIAGNOSIS — E782 Mixed hyperlipidemia: Secondary | ICD-10-CM | POA: Diagnosis not present

## 2023-04-13 DIAGNOSIS — R7301 Impaired fasting glucose: Secondary | ICD-10-CM | POA: Diagnosis not present

## 2023-04-20 ENCOUNTER — Encounter: Payer: Self-pay | Admitting: Internal Medicine

## 2023-04-20 DIAGNOSIS — I2510 Atherosclerotic heart disease of native coronary artery without angina pectoris: Secondary | ICD-10-CM | POA: Diagnosis not present

## 2023-04-20 DIAGNOSIS — R7301 Impaired fasting glucose: Secondary | ICD-10-CM | POA: Diagnosis not present

## 2023-04-20 DIAGNOSIS — Z0000 Encounter for general adult medical examination without abnormal findings: Secondary | ICD-10-CM | POA: Diagnosis not present

## 2023-04-20 DIAGNOSIS — I6529 Occlusion and stenosis of unspecified carotid artery: Secondary | ICD-10-CM | POA: Diagnosis not present

## 2023-04-20 DIAGNOSIS — E782 Mixed hyperlipidemia: Secondary | ICD-10-CM | POA: Diagnosis not present

## 2023-04-20 DIAGNOSIS — I10 Essential (primary) hypertension: Secondary | ICD-10-CM | POA: Diagnosis not present

## 2023-04-20 DIAGNOSIS — K219 Gastro-esophageal reflux disease without esophagitis: Secondary | ICD-10-CM | POA: Diagnosis not present

## 2023-04-20 DIAGNOSIS — F17200 Nicotine dependence, unspecified, uncomplicated: Secondary | ICD-10-CM | POA: Diagnosis not present

## 2023-04-20 DIAGNOSIS — M2550 Pain in unspecified joint: Secondary | ICD-10-CM | POA: Diagnosis not present

## 2023-04-20 DIAGNOSIS — J449 Chronic obstructive pulmonary disease, unspecified: Secondary | ICD-10-CM | POA: Diagnosis not present

## 2023-10-12 DIAGNOSIS — E782 Mixed hyperlipidemia: Secondary | ICD-10-CM | POA: Diagnosis not present

## 2023-10-12 DIAGNOSIS — R7301 Impaired fasting glucose: Secondary | ICD-10-CM | POA: Diagnosis not present

## 2023-10-21 DIAGNOSIS — R7301 Impaired fasting glucose: Secondary | ICD-10-CM | POA: Diagnosis not present

## 2023-10-21 DIAGNOSIS — E782 Mixed hyperlipidemia: Secondary | ICD-10-CM | POA: Diagnosis not present

## 2023-10-21 DIAGNOSIS — I6529 Occlusion and stenosis of unspecified carotid artery: Secondary | ICD-10-CM | POA: Diagnosis not present

## 2023-10-21 DIAGNOSIS — R2689 Other abnormalities of gait and mobility: Secondary | ICD-10-CM | POA: Diagnosis not present

## 2023-10-21 DIAGNOSIS — Z23 Encounter for immunization: Secondary | ICD-10-CM | POA: Diagnosis not present

## 2023-10-21 DIAGNOSIS — K219 Gastro-esophageal reflux disease without esophagitis: Secondary | ICD-10-CM | POA: Diagnosis not present

## 2023-10-21 DIAGNOSIS — J449 Chronic obstructive pulmonary disease, unspecified: Secondary | ICD-10-CM | POA: Diagnosis not present

## 2023-10-21 DIAGNOSIS — I2510 Atherosclerotic heart disease of native coronary artery without angina pectoris: Secondary | ICD-10-CM | POA: Diagnosis not present

## 2023-10-21 DIAGNOSIS — R2681 Unsteadiness on feet: Secondary | ICD-10-CM | POA: Diagnosis not present

## 2023-10-21 DIAGNOSIS — I10 Essential (primary) hypertension: Secondary | ICD-10-CM | POA: Diagnosis not present

## 2023-11-02 DIAGNOSIS — D230 Other benign neoplasm of skin of lip: Secondary | ICD-10-CM | POA: Diagnosis not present

## 2023-11-02 DIAGNOSIS — D485 Neoplasm of uncertain behavior of skin: Secondary | ICD-10-CM | POA: Diagnosis not present

## 2023-11-02 DIAGNOSIS — C44612 Basal cell carcinoma of skin of right upper limb, including shoulder: Secondary | ICD-10-CM | POA: Diagnosis not present

## 2023-11-05 DIAGNOSIS — M6281 Muscle weakness (generalized): Secondary | ICD-10-CM | POA: Diagnosis not present

## 2023-11-09 DIAGNOSIS — M6281 Muscle weakness (generalized): Secondary | ICD-10-CM | POA: Diagnosis not present

## 2023-11-09 DIAGNOSIS — R296 Repeated falls: Secondary | ICD-10-CM | POA: Diagnosis not present

## 2023-11-12 DIAGNOSIS — M6281 Muscle weakness (generalized): Secondary | ICD-10-CM | POA: Diagnosis not present

## 2023-11-12 DIAGNOSIS — R296 Repeated falls: Secondary | ICD-10-CM | POA: Diagnosis not present

## 2023-11-16 DIAGNOSIS — R296 Repeated falls: Secondary | ICD-10-CM | POA: Diagnosis not present

## 2023-11-16 DIAGNOSIS — M6281 Muscle weakness (generalized): Secondary | ICD-10-CM | POA: Diagnosis not present

## 2023-11-19 DIAGNOSIS — C44612 Basal cell carcinoma of skin of right upper limb, including shoulder: Secondary | ICD-10-CM | POA: Diagnosis not present

## 2023-11-19 DIAGNOSIS — D485 Neoplasm of uncertain behavior of skin: Secondary | ICD-10-CM | POA: Diagnosis not present

## 2023-11-19 DIAGNOSIS — D0461 Carcinoma in situ of skin of right upper limb, including shoulder: Secondary | ICD-10-CM | POA: Diagnosis not present

## 2023-12-03 DIAGNOSIS — C44622 Squamous cell carcinoma of skin of right upper limb, including shoulder: Secondary | ICD-10-CM | POA: Diagnosis not present

## 2023-12-10 DIAGNOSIS — M6281 Muscle weakness (generalized): Secondary | ICD-10-CM | POA: Diagnosis not present

## 2023-12-10 DIAGNOSIS — R296 Repeated falls: Secondary | ICD-10-CM | POA: Diagnosis not present

## 2023-12-14 DIAGNOSIS — R296 Repeated falls: Secondary | ICD-10-CM | POA: Diagnosis not present

## 2023-12-14 DIAGNOSIS — M6281 Muscle weakness (generalized): Secondary | ICD-10-CM | POA: Diagnosis not present

## 2023-12-16 DIAGNOSIS — I10 Essential (primary) hypertension: Secondary | ICD-10-CM | POA: Diagnosis not present

## 2023-12-16 DIAGNOSIS — I6529 Occlusion and stenosis of unspecified carotid artery: Secondary | ICD-10-CM | POA: Diagnosis not present

## 2023-12-28 DIAGNOSIS — F17210 Nicotine dependence, cigarettes, uncomplicated: Secondary | ICD-10-CM | POA: Diagnosis not present

## 2023-12-28 DIAGNOSIS — I10 Essential (primary) hypertension: Secondary | ICD-10-CM | POA: Diagnosis not present

## 2023-12-28 DIAGNOSIS — Z79899 Other long term (current) drug therapy: Secondary | ICD-10-CM | POA: Diagnosis not present

## 2024-01-04 DIAGNOSIS — L218 Other seborrheic dermatitis: Secondary | ICD-10-CM | POA: Diagnosis not present

## 2024-01-05 DIAGNOSIS — H26492 Other secondary cataract, left eye: Secondary | ICD-10-CM | POA: Diagnosis not present

## 2024-01-11 ENCOUNTER — Ambulatory Visit: Payer: Medicare Other | Attending: Nurse Practitioner | Admitting: Nurse Practitioner

## 2024-01-11 ENCOUNTER — Encounter: Payer: Self-pay | Admitting: Nurse Practitioner

## 2024-01-11 ENCOUNTER — Ambulatory Visit: Payer: Medicare Other | Admitting: Cardiology

## 2024-01-11 VITALS — BP 120/74 | HR 74 | Ht 70.0 in | Wt 228.0 lb

## 2024-01-11 DIAGNOSIS — E782 Mixed hyperlipidemia: Secondary | ICD-10-CM

## 2024-01-11 DIAGNOSIS — R0989 Other specified symptoms and signs involving the circulatory and respiratory systems: Secondary | ICD-10-CM

## 2024-01-11 DIAGNOSIS — I2510 Atherosclerotic heart disease of native coronary artery without angina pectoris: Secondary | ICD-10-CM | POA: Diagnosis not present

## 2024-01-11 DIAGNOSIS — I10 Essential (primary) hypertension: Secondary | ICD-10-CM

## 2024-01-11 DIAGNOSIS — R0602 Shortness of breath: Secondary | ICD-10-CM | POA: Diagnosis not present

## 2024-01-11 DIAGNOSIS — I25119 Atherosclerotic heart disease of native coronary artery with unspecified angina pectoris: Secondary | ICD-10-CM

## 2024-01-11 NOTE — Progress Notes (Signed)
 Cardiology Office Note:  .   Date:  01/11/2024 ID:  Brandon JEANSONNE, DOB 03-24-1956, MRN [id number] PCP: Brandon Stabile, MD  [facility] Providers Cardiologist:  Brandon Dell, MD    History of Present Illness: .   Brandon Pratt is a 68 y.o. male with a PMH of CAD, s/p CABG, mixed hyperlipidemia, history of tobacco abuse, who presents today for 1 year follow-up.  Last seen by Dr. Diona Pratt on August 07, 2022.  He was doing well at the time.  Today he presents for 1 year follow-up.  He states since he was last seen in our office, he has had an eye operation, and skin cancer removed from his right forearm. Admits to shortness of breath that has been ongoing over the past year. Says this happens intermittently, difficult to tell when he last experienced symptoms, says to his knowledge he has not experienced shortness of breath in the past month. Denies any chest pain, palpitations, syncope, presyncope, dizziness, orthopnea, PND, swelling or significant weight changes, acute bleeding, or claudication.   ROS: Negative. See HPI.   Studies Reviewed: Marland Kitchen    EKG: EKG Interpretation Date/Time:  Monday January 11 2024 15:00:40 EST Ventricular Rate:  74 PR Interval:  200 QRS Duration:  162 QT Interval:  414 QTC Calculation: 459 R Axis:   269  Text Interpretation: Normal sinus rhythm Right bundle branch block When compared with ECG of 24-Jul-2022 11:20, Nonspecific T wave abnormality no longer evident in Lateral leads Confirmed by Brandon Pratt [phone]) on 01/11/2024 3:22:38 PM   Carotid duplex 01/2020:  Summary:  Right Carotid: Velocities in the right ICA are consistent with a 1-39%  stenosis.   Left Carotid: Velocities in the left ICA are consistent with a 1-39%  stenosis.   Vertebrals: Bilateral vertebral arteries demonstrate antegrade flow.  Subclavians: Normal flow hemodynamics were seen in bilateral subclavian arteries.   *See table(s) above for measurements and  observations.  ETT with Nuclear Stress Test 12/2012:  This study was performed using the standard Bruce exercise protocol.  The patient exercised into stage III reaching under 60 bpm, 98% MPHR.  Maximum METS of 7 were achieved.  The patient experienced no chest pain.  No clearly diagnostic ST segment changes, no arrhythmias.  An adequate level of stress was achieved. Equivocal LV perfusion, overall low risk study.  Limitations in artifact were due to use of diaphragm activity.  Partially reversible inferior defect noted in the setting of gut uptake adjacent to the inferior wall at rest -cannot exclude mild ischemia in this distribution.  The patient's calculated post-rest LVEF was 66%. TID ratio 1.  Physical Exam:   VS:  BP 120/74   Pulse 74   Ht 5\' 10"  (1.778 m)   Wt 228 lb (103.4 kg)   SpO2 98%   BMI 32.71 kg/m    Wt Readings from Last 3 Encounters:  01/11/24 228 lb (103.4 kg)  08/07/22 227 lb (103 kg)  07/24/22 220 lb 0.3 oz (99.8 kg)    GEN: Obese, 68 y.o. male in no acute distress NECK: No JVD; carotid bruits noted bilaterally CARDIAC: S1/S2, RRR, no murmurs, rubs, gallops RESPIRATORY:  Clear to auscultation without rales, wheezing or rhonchi  ABDOMEN: Soft, non-tender, non-distended EXTREMITIES:  No edema; No deformity   ASSESSMENT AND PLAN: .    CAD, s/p CABG Stable with no anginal symptoms. No indication for ischemic evaluation.  Continue aspirin, lisinopril, pravastatin, and nitroglycerin as needed. Heart healthy diet and regular  cardiovascular exercise encouraged.   Mixed HLD Labs obtained within the past year from Dr. Scharlene Gloss office revealed LDL 71. Continue pravastatin. Heart healthy diet and regular cardiovascular exercise encouraged. Requested that when his most recent lab work is completed to fax this over to our office.  He verbalized understanding.  Carotid bruits Denies any symptoms.  Last carotid Doppler in 2021 revealed 1 to 39% stenosis bilaterally.  Will update  carotid duplex at this time. Heart healthy diet and regular cardiovascular exercise encouraged.  No medication changes at this time.  Shortness of breath Etiology is unclear.  Patient was a difficult historian. He denies any recent symptoms in the past month. Will arrange echocardiogram for further evaluation of his symptoms.  Care and ED precautions discussed.     Dispo: Follow-up with me/[facility] in 3 to 4 months or sooner if anything changes.  Signed, Brandon Dory, NP

## 2024-01-11 NOTE — Patient Instructions (Addendum)
 Medication Instructions:  Your physician recommends that you continue on your current medications as directed. Please refer to the Current Medication list given to you today.  Labwork: None   Testing/Procedures: Your physician has requested that you have an echocardiogram. Echocardiography is a painless test that uses sound waves to create images of your heart. It provides your doctor with information about the size and shape of your heart and how well your heart's chambers and valves are working. This procedure takes approximately one hour. There are no restrictions for this procedure. Please do NOT wear cologne, perfume, aftershave, or lotions (deodorant is allowed). Please arrive 15 minutes prior to your appointment time.  Please note: We ask at that you not bring children with you during ultrasound (echo/ vascular) testing. Due to room size and safety concerns, children are not allowed in the ultrasound rooms during exams. Our front office staff cannot provide observation of children in our lobby area while testing is being conducted. An adult accompanying a patient to their appointment will only be allowed in the ultrasound room at the discretion of the ultrasound technician under special circumstances. We apologize for any inconvenience. Your physician has requested that you have a carotid duplex. This test is an ultrasound of the carotid arteries in your neck. It looks at blood flow through these arteries that supply the brain with blood. Allow one hour for this exam. There are no restrictions or special instructions.  Follow-Up: Your physician recommends that you schedule a follow-up appointment in: 3-4 months   Any Other Special Instructions Will Be Listed Below (If Applicable).  If you need a refill on your cardiac medications before your next appointment, please call your pharmacy.

## 2024-01-13 ENCOUNTER — Encounter: Payer: Self-pay | Admitting: Internal Medicine

## 2024-01-26 ENCOUNTER — Ambulatory Visit: Payer: Medicare Other | Attending: Nurse Practitioner

## 2024-01-26 ENCOUNTER — Ambulatory Visit: Payer: Medicare Other

## 2024-01-26 DIAGNOSIS — E782 Mixed hyperlipidemia: Secondary | ICD-10-CM

## 2024-01-26 DIAGNOSIS — I2510 Atherosclerotic heart disease of native coronary artery without angina pectoris: Secondary | ICD-10-CM

## 2024-01-26 DIAGNOSIS — R0989 Other specified symptoms and signs involving the circulatory and respiratory systems: Secondary | ICD-10-CM | POA: Diagnosis not present

## 2024-01-26 LAB — ECHOCARDIOGRAM COMPLETE
AR max vel: 5.32 cm2
AV Area VTI: 5.51 cm2
AV Area mean vel: 5.48 cm2
AV Mean grad: 5 mmHg
AV Peak grad: 10 mmHg
Ao pk vel: 1.58 m/s
Area-P 1/2: 2.58 cm2
Calc EF: 70.3 %
MV VTI: 6.56 cm2
S' Lateral: 3.5 cm
Single Plane A2C EF: 62.1 %
Single Plane A4C EF: 77.7 %

## 2024-01-26 MED ORDER — PERFLUTREN LIPID MICROSPHERE
1.0000 mL | INTRAVENOUS | Status: AC | PRN
  Administered 2024-01-26: 5 mL via INTRAVENOUS

## 2024-01-29 ENCOUNTER — Telehealth: Payer: Self-pay | Admitting: Cardiology

## 2024-01-29 NOTE — Telephone Encounter (Signed)
 Brandon Dory, NP 01/29/2024  3:52 PM EST     Normal heart pumping function.  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  We will continue to monitor this over time.  Overall this is a reassuring echocardiogram report.   Thanks!   Brandon Pratt, AGNP-C    Results discussed with patient, will call back when carotid resulted by MD

## 2024-01-29 NOTE — Telephone Encounter (Signed)
Patient called to follow-up on test results. 

## 2024-01-29 NOTE — Telephone Encounter (Signed)
 Echo and carotid done on 01/26/24   I will forward to E.Peck,NP

## 2024-02-03 ENCOUNTER — Telehealth: Payer: Self-pay | Admitting: Nurse Practitioner

## 2024-02-03 NOTE — Telephone Encounter (Signed)
 The patient has been notified of the result and verbalized understanding.  All questions (if any) were answered. Roseanne Reno, CMA 02/03/2024 1:53 PM

## 2024-02-03 NOTE — Telephone Encounter (Signed)
 Sharlene Dory, NP 02/02/2024  4:27 PM EST     Mild blockages noted to bilateral carotids, will monitor this over time.   Sharlene Dory, AGNP-C

## 2024-02-03 NOTE — Telephone Encounter (Signed)
 Pt calling in for carotid results

## 2024-02-15 ENCOUNTER — Ambulatory Visit: Payer: Medicare Other | Admitting: Nurse Practitioner

## 2024-04-12 DIAGNOSIS — E782 Mixed hyperlipidemia: Secondary | ICD-10-CM | POA: Diagnosis not present

## 2024-04-12 DIAGNOSIS — R7301 Impaired fasting glucose: Secondary | ICD-10-CM | POA: Diagnosis not present

## 2024-04-18 ENCOUNTER — Ambulatory Visit: Payer: Medicare Other | Admitting: Nurse Practitioner

## 2024-04-18 DIAGNOSIS — E782 Mixed hyperlipidemia: Secondary | ICD-10-CM | POA: Diagnosis not present

## 2024-04-18 DIAGNOSIS — R7301 Impaired fasting glucose: Secondary | ICD-10-CM | POA: Diagnosis not present

## 2024-04-18 DIAGNOSIS — M2550 Pain in unspecified joint: Secondary | ICD-10-CM | POA: Diagnosis not present

## 2024-04-18 DIAGNOSIS — R2689 Other abnormalities of gait and mobility: Secondary | ICD-10-CM | POA: Diagnosis not present

## 2024-04-18 DIAGNOSIS — I2510 Atherosclerotic heart disease of native coronary artery without angina pectoris: Secondary | ICD-10-CM | POA: Diagnosis not present

## 2024-04-18 DIAGNOSIS — F17210 Nicotine dependence, cigarettes, uncomplicated: Secondary | ICD-10-CM | POA: Diagnosis not present

## 2024-04-18 DIAGNOSIS — K219 Gastro-esophageal reflux disease without esophagitis: Secondary | ICD-10-CM | POA: Diagnosis not present

## 2024-04-18 DIAGNOSIS — J449 Chronic obstructive pulmonary disease, unspecified: Secondary | ICD-10-CM | POA: Diagnosis not present

## 2024-04-18 DIAGNOSIS — I10 Essential (primary) hypertension: Secondary | ICD-10-CM | POA: Diagnosis not present

## 2024-04-18 DIAGNOSIS — I6529 Occlusion and stenosis of unspecified carotid artery: Secondary | ICD-10-CM | POA: Diagnosis not present

## 2024-04-19 ENCOUNTER — Ambulatory Visit: Payer: Self-pay | Admitting: Nurse Practitioner

## 2024-04-20 NOTE — Telephone Encounter (Signed)
 Patient's wife is requesting to speak with nurse in regard to the patient's lab results.

## 2024-04-21 ENCOUNTER — Ambulatory Visit: Attending: Nurse Practitioner | Admitting: Nurse Practitioner

## 2024-04-21 ENCOUNTER — Encounter: Payer: Self-pay | Admitting: Nurse Practitioner

## 2024-04-21 VITALS — BP 124/70 | HR 81 | Ht 71.0 in | Wt 227.0 lb

## 2024-04-21 DIAGNOSIS — I6523 Occlusion and stenosis of bilateral carotid arteries: Secondary | ICD-10-CM | POA: Diagnosis not present

## 2024-04-21 DIAGNOSIS — R062 Wheezing: Secondary | ICD-10-CM

## 2024-04-21 DIAGNOSIS — E782 Mixed hyperlipidemia: Secondary | ICD-10-CM

## 2024-04-21 DIAGNOSIS — R2689 Other abnormalities of gait and mobility: Secondary | ICD-10-CM

## 2024-04-21 DIAGNOSIS — I2510 Atherosclerotic heart disease of native coronary artery without angina pectoris: Secondary | ICD-10-CM

## 2024-04-21 DIAGNOSIS — R0602 Shortness of breath: Secondary | ICD-10-CM | POA: Diagnosis not present

## 2024-04-21 DIAGNOSIS — Z9181 History of falling: Secondary | ICD-10-CM

## 2024-04-21 NOTE — Patient Instructions (Addendum)
 Medication Instructions:  Your physician recommends that you continue on your current medications as directed. Please refer to the Current Medication list given to you today.  Labwork: None   Testing/Procedures: A chest x-ray takes a picture of the organs and structures inside the chest, including the heart, lungs, and blood vessels. This test can show several things, including, whether the heart is enlarges; whether fluid is building up in the lungs; and whether pacemaker / defibrillator leads are still in place.  Follow-Up: Your physician recommends that you schedule a follow-up appointment in: 4-6 weeks   Any Other Special Instructions Will Be Listed Below (If Applicable).  If you need a refill on your cardiac medications before your next appointment, please call your pharmacy.

## 2024-04-21 NOTE — Progress Notes (Unsigned)
 Cardiology Office Note:  .   Date: 04/21/2024 ID:  Brandon Pratt, DOB 1956/07/10, MRN [id number] PCP: Omie Bickers, MD  [facility] Providers Cardiologist:  Teddie Favre, MD    History of Present Illness: .   Brandon Pratt is a 68 y.o. male with a PMH of CAD, s/p CABG, mixed hyperlipidemia, history of tobacco abuse, who presents today for scheduled follow-up.  Last seen by Dr. Londa Rival on August 07, 2022.  He was doing well at the time.  01/11/2024 - Today he presents for 1 year follow-up.  He states since he was last seen in our office, he has had an eye operation, and skin cancer removed from his right forearm. Admits to shortness of breath that has been ongoing over the past year. Says this happens intermittently, difficult to tell when he last experienced symptoms, says to his knowledge he has not experienced shortness of breath in the past month. Denies any chest pain, palpitations, syncope, presyncope, dizziness, orthopnea, PND, swelling or significant weight changes, acute bleeding, or claudication.   04/21/2024 - Presents today for follow-up with his wfie.  Says his breathing is still stable from last office visit.  He reports a chronic feeling of being off balance for the last 2 years, admits to frequent falls.  Wife says he has a fall about once a month.  Denies any acute injuries or head injuries per his report.  Denies any chest pain, palpitations, syncope, presyncope, dizziness, orthopnea, PND, swelling or significant weight changes, acute bleeding, or claudication. Wife says he has been having moaning when breathing for the past 6-8 months.    ROS: Negative. See HPI.   Studies Reviewed: Aaron Aas    EKG: EKG is not ordered today.  Carotid duplex 01/2024:  Summary:  Right Carotid: Velocities in the right ICA are consistent with a 1-39%  stenosis. Non-hemodynamically significant plaque <50% noted in the  CCA. The ECA appears <50% stenosed.   Left Carotid: Velocities in the  left ICA are consistent with a 1-39%  stenosis. Non-hemodynamically significant plaque <50% noted in the  CCA. The ECA appears <50% stenosed.   Vertebrals:  Bilateral vertebral arteries demonstrate antegrade flow.  Subclavians: Normal flow hemodynamics were seen in bilateral subclavian arteries.   *See table(s) above for measurements and observations.  Suggest follow up study in When clinically indicated. Compared to  02/15/2020 no significant change.  Echo 01/2024:  1. Left ventricular ejection fraction, by estimation, is 65 to 70%. The  left ventricle has normal function. The left ventricle has no regional  wall motion abnormalities. There is moderate concentric left ventricular  hypertrophy. Left ventricular diastolic parameters are consistent with Grade I diastolic dysfunction (impaired relaxation).   2. Right ventricular systolic function is normal. The right ventricular  size is normal. Tricuspid regurgitation signal is inadequate for assessing  PA pressure.   3. The mitral valve is grossly normal. Trivial mitral valve  regurgitation.   4. The aortic valve is tricuspid. There is mild calcification of the  aortic valve. Aortic valve regurgitation is not visualized. Aortic valve  sclerosis is present, with no evidence of aortic valve stenosis. Aortic  valve mean gradient measures 5.0 mmHg.   5. Aortic dilatation noted. There is moderate dilatation of the ascending  aorta, measuring 44 mm.   6. The inferior vena cava is normal in size with greater than 50%  respiratory variability, suggesting right atrial pressure of 3 mmHg.   Comparison(s): No prior Echocardiogram.  Carotid  duplex 01/2020:  Summary:  Right Carotid: Velocities in the right ICA are consistent with a 1-39%  stenosis.   Left Carotid: Velocities in the left ICA are consistent with a 1-39%  stenosis.   Vertebrals: Bilateral vertebral arteries demonstrate antegrade flow.  Subclavians: Normal flow hemodynamics were  seen in bilateral subclavian arteries.   *See table(s) above for measurements and observations.  ETT with Nuclear Stress Test 12/2012:  This study was performed using the standard Bruce exercise protocol.  The patient exercised into stage III reaching under 60 bpm, 98% MPHR.  Maximum METS of 7 were achieved.  The patient experienced no chest pain.  No clearly diagnostic ST segment changes, no arrhythmias.  An adequate level of stress was achieved. Equivocal LV perfusion, overall low risk study.  Limitations in artifact were due to use of diaphragm activity.  Partially reversible inferior defect noted in the setting of gut uptake adjacent to the inferior wall at rest -cannot exclude mild ischemia in this distribution.  The patient's calculated post-rest LVEF was 66%. TID ratio 1.  Physical Exam:   VS:  BP 124/70   Pulse 81   Ht 5\' 11"  (1.803 m)   Wt 227 lb (103 kg)   SpO2 95%   BMI 31.66 kg/m    Wt Readings from Last 3 Encounters:  04/21/24 227 lb (103 kg)  01/11/24 228 lb (103.4 kg)  08/07/22 227 lb (103 kg)    GEN: Obese, 68 y.o. male in no acute distress NECK: No JVD; carotid bruits noted bilaterally CARDIAC: S1/S2, RRR, no murmurs, rubs, gallops RESPIRATORY:  Expiratory wheezing noted bilaterally on exam. No rhonchi or rales noted. Moaning noted during breathing.  ABDOMEN: Soft, non-tender, non-distended EXTREMITIES:  No edema; No deformity   ASSESSMENT AND PLAN: .    CAD, s/p CABG Stable with no anginal symptoms. Does admit to stable DOE.  Did discuss NST at this time but due to patient's respiratory status currently we will hold off at this time.  Continue aspirin , lisinopril , pravastatin , and nitroglycerin  as needed. Heart healthy diet and regular cardiovascular exercise encouraged.   Mixed HLD Most recent labs faxed to our office show stable labs. Continue pravastatin . Heart healthy diet and regular cardiovascular exercise encouraged.  Carotid artery disease Denies any  symptoms.  Most recent carotid duplex showed stable bilateral carotid artery stenosis.  Will continue to monitor.  No medication changes at this time. Heart healthy diet encouraged.   Shortness of breath, wheezing Will arrange two-view chest x-ray as soon as possible and refer him to pulmonology in [place] for first available appointment.  He is not in acute distress on exam. believe his symptoms are pulmonary in nature. May need PFT's to be performed. Once breathing is stable, will bring him back and evaluate further from a cardiac standpoint. Care and ED precautions discussed.    5. Hx of falls, poor balance Denies any acute injuries or injuries to the head.  Recommended follow-up with PCP for further evaluation and may benefit from PT/OT evaluation.   Dispo: Care and ED precautions discussed. Follow-up with me/APP in 4-6 weeks or sooner if anything changes.  Signed, Lasalle Pointer, NP

## 2024-04-22 ENCOUNTER — Ambulatory Visit (HOSPITAL_COMMUNITY)
Admission: RE | Admit: 2024-04-22 | Discharge: 2024-04-22 | Disposition: A | Discharge status: Home / Self Care | Source: Ambulatory Visit | Attending: Nurse Practitioner | Admitting: Nurse Practitioner

## 2024-04-22 DIAGNOSIS — R0602 Shortness of breath: Secondary | ICD-10-CM | POA: Diagnosis not present

## 2024-04-29 ENCOUNTER — Ambulatory Visit: Payer: Self-pay | Admitting: Nurse Practitioner

## 2024-06-16 NOTE — Progress Notes (Signed)
 Cardiology Office Note:  .   Date: 06/17/2024 ID:  Brandon Pratt Fair, DOB November 12, 1956, MRN [id number] PCP: Shona Norleen PEDLAR, MD  [facility] Providers Cardiologist:  Jayson Sierras, MD    History of Present Illness: .   Brandon Pratt is a 68 y.o. male with a PMH of CAD, s/p CABG, mixed hyperlipidemia, history of tobacco abuse, who presents today for scheduled follow-up.  Last seen by Dr. Sierras on August 07, 2022.  He was doing well at the time.  01/11/2024 - Today he presents for 1 year follow-up.  He states since he was last seen in our office, he has had an eye operation, and skin cancer removed from his right forearm. Admits to shortness of breath that has been ongoing over the past year. Says this happens intermittently, difficult to tell when he last experienced symptoms, says to his knowledge he has not experienced shortness of breath in the past month. Denies any chest pain, palpitations, syncope, presyncope, dizziness, orthopnea, PND, swelling or significant weight changes, acute bleeding, or claudication.   04/21/2024 - Presents today for follow-up with his wfie.  Says his breathing is still stable from last office visit.  He reports a chronic feeling of being off balance for the last 2 years, admits to frequent falls.  Wife says he has a fall about once a month.  Denies any acute injuries or head injuries per his report.  Denies any chest pain, palpitations, syncope, presyncope, dizziness, orthopnea, PND, swelling or significant weight changes, acute bleeding, or claudication. Wife says he has been having moaning when breathing for the past 6-8 months.   06/17/2024 - Here for follow-up. Denies any acute cardiac complaints or issues. Wife says he has not had any more recurrent falls since I have last seen him. Balance seems to be some better. Denies any chest pain, worsening shortness of breath, palpitations, syncope, presyncope, dizziness, orthopnea, PND, swelling or significant weight  changes, acute bleeding, or claudication.   ROS: Negative. See HPI.   Studies Reviewed: SABRA    EKG: EKG is not ordered today.  Carotid duplex 01/2024:  Summary:  Right Carotid: Velocities in the right ICA are consistent with a 1-39%  stenosis. Non-hemodynamically significant plaque <50% noted in the  CCA. The ECA appears <50% stenosed.   Left Carotid: Velocities in the left ICA are consistent with a 1-39%  stenosis. Non-hemodynamically significant plaque <50% noted in the  CCA. The ECA appears <50% stenosed.   Vertebrals:  Bilateral vertebral arteries demonstrate antegrade flow.  Subclavians: Normal flow hemodynamics were seen in bilateral subclavian arteries.   *See table(s) above for measurements and observations.  Suggest follow up study in When clinically indicated. Compared to  02/15/2020 no significant change.  Echo 01/2024:  1. Left ventricular ejection fraction, by estimation, is 65 to 70%. The  left ventricle has normal function. The left ventricle has no regional  wall motion abnormalities. There is moderate concentric left ventricular  hypertrophy. Left ventricular diastolic parameters are consistent with Grade I diastolic dysfunction (impaired relaxation).   2. Right ventricular systolic function is normal. The right ventricular  size is normal. Tricuspid regurgitation signal is inadequate for assessing  PA pressure.   3. The mitral valve is grossly normal. Trivial mitral valve  regurgitation.   4. The aortic valve is tricuspid. There is mild calcification of the  aortic valve. Aortic valve regurgitation is not visualized. Aortic valve  sclerosis is present, with no evidence of aortic valve stenosis. Aortic  valve mean gradient measures 5.0 mmHg.   5. Aortic dilatation noted. There is moderate dilatation of the ascending  aorta, measuring 44 mm.   6. The inferior vena cava is normal in size with greater than 50%  respiratory variability, suggesting right atrial  pressure of 3 mmHg.   Comparison(s): No prior Echocardiogram.  Carotid duplex 01/2020:  Summary:  Right Carotid: Velocities in the right ICA are consistent with a 1-39%  stenosis.   Left Carotid: Velocities in the left ICA are consistent with a 1-39%  stenosis.   Vertebrals: Bilateral vertebral arteries demonstrate antegrade flow.  Subclavians: Normal flow hemodynamics were seen in bilateral subclavian arteries.   *See table(s) above for measurements and observations.  ETT with Nuclear Stress Test 12/2012:  This study was performed using the standard Bruce exercise protocol.  The patient exercised into stage III reaching under 60 bpm, 98% MPHR.  Maximum METS of 7 were achieved.  The patient experienced no chest pain.  No clearly diagnostic ST segment changes, no arrhythmias.  An adequate level of stress was achieved. Equivocal LV perfusion, overall low risk study.  Limitations in artifact were due to use of diaphragm activity.  Partially reversible inferior defect noted in the setting of gut uptake adjacent to the inferior wall at rest -cannot exclude mild ischemia in this distribution.  The patient's calculated post-rest LVEF was 66%. TID ratio 1.  Physical Exam:   VS:  BP 118/78   Pulse 73   Ht 5' 9 (1.753 m)   Wt 227 lb 3.2 oz (103.1 kg)   SpO2 96%   BMI 33.55 kg/m    Wt Readings from Last 3 Encounters:  06/17/24 227 lb 3.2 oz (103.1 kg)  04/21/24 227 lb (103 kg)  01/11/24 228 lb (103.4 kg)    GEN: Obese, 68 y.o. male in no acute distress NECK: No JVD; carotid bruits noted bilaterally CARDIAC: S1/S2, RRR, no murmurs, rubs, gallops RESPIRATORY:  Clear and diminished breath sounds with minimal expiratory wheezing noted bilaterally on exam. No rhonchi or rales noted. Moaning noted during breathing.  ABDOMEN: Soft, non-tender, non-distended EXTREMITIES:  No edema; No deformity   ASSESSMENT AND PLAN: .    CAD, s/p CABG Stable with no anginal symptoms. Does admit to stable  DOE.  Did discuss NST at this time but due to patient's respiratory status currently we will hold off at this time.  Continue aspirin , lisinopril , pravastatin , and nitroglycerin  as needed. Heart healthy diet and regular cardiovascular exercise encouraged.   Mixed HLD Most recent labs faxed to our office show stable labs. Continue pravastatin . Heart healthy diet and regular cardiovascular exercise encouraged.  Carotid artery disease Denies any symptoms.  Most recent carotid duplex showed stable bilateral carotid artery stenosis.  Will continue to monitor.  No medication changes at this time. Heart healthy diet encouraged.   Shortness of breath, wheezing He has an upcoming appt with Pulmonology. He is not in acute distress on exam. believe his symptoms are pulmonary in nature. May need PFT's to be performed. Once breathing is stable, will bring him back and evaluate further from a cardiac standpoint. Care and ED precautions discussed.    5. Hx of falls, poor balance This has improved some since last office visit. Denies any acute injuries or injuries to the head.  Recommended follow-up with PCP for further evaluation and may benefit from PT/OT evaluation.  6. Ascending aortic aneurysm Noted on echocardiogram in February 2025.  There was moderate dilatation of ascending aorta measuring  44 mm.  Initially placed referral for cardiothoracic surgery after discussing this further with patient and his wife, they are requiring a CTA of chest to be performed and we will arrange and obtain BMET prior to testing.   Dispo: Care and ED precautions discussed. Follow-up with me/APP in 3-4 months or sooner if anything changes.  Signed, Almarie Crate, NP

## 2024-06-17 ENCOUNTER — Encounter: Payer: Self-pay | Admitting: Nurse Practitioner

## 2024-06-17 ENCOUNTER — Ambulatory Visit: Attending: Nurse Practitioner | Admitting: Nurse Practitioner

## 2024-06-17 VITALS — BP 118/78 | HR 73 | Ht 69.0 in | Wt 227.2 lb

## 2024-06-17 DIAGNOSIS — I2510 Atherosclerotic heart disease of native coronary artery without angina pectoris: Secondary | ICD-10-CM | POA: Diagnosis not present

## 2024-06-17 DIAGNOSIS — R2689 Other abnormalities of gait and mobility: Secondary | ICD-10-CM

## 2024-06-17 DIAGNOSIS — I7121 Aneurysm of the ascending aorta, without rupture: Secondary | ICD-10-CM | POA: Diagnosis not present

## 2024-06-17 DIAGNOSIS — I6523 Occlusion and stenosis of bilateral carotid arteries: Secondary | ICD-10-CM

## 2024-06-17 DIAGNOSIS — R062 Wheezing: Secondary | ICD-10-CM | POA: Diagnosis not present

## 2024-06-17 DIAGNOSIS — R0602 Shortness of breath: Secondary | ICD-10-CM | POA: Diagnosis not present

## 2024-06-17 DIAGNOSIS — Z9181 History of falling: Secondary | ICD-10-CM | POA: Diagnosis not present

## 2024-06-17 DIAGNOSIS — E782 Mixed hyperlipidemia: Secondary | ICD-10-CM

## 2024-06-17 NOTE — Patient Instructions (Signed)
 Medication Instructions:   Continue all current medications.   Labwork:  none  Testing/Procedures:  none  Follow-Up:  3-4 months    Any Other Special Instructions Will Be Listed Below (If Applicable).  You have been referred to:  Cardiothoracic Surgery  If you need a refill on your cardiac medications before your next appointment, please call your pharmacy.

## 2024-06-26 NOTE — Progress Notes (Unsigned)
 Brandon Pratt, male    DOB: Nov 09, 1956    MRN: [id number]   Brief patient profile:  68  yowm  active smoker / passive smoker with sinus problems whole = nasal congestion  referred to pulmonary clinic in [place]  06/27/2024 by Miriam NP for doe x 2023    Pt not previously seen by PCCM service.    History of Present Illness  06/27/2024  Pulmonary/ 1st office eval/ Buffie Herne / [place] [facility]  Chief Complaint  Patient presents with   Establish Care  Dyspnea:  [employer] / [employer] parking still sob but time he gets into store slow pace pushing buggy stops frequently /also slowed by back pain  Cough: worse in am 30 min grey [employer] with sense of pnds/sneezing  Sleep: sleeps in straight up chair due to breathing  SABA use: trelegy ? Really helping / also bevespi and incruse s benefit 02: none     No obvious day to day or daytime pattern/variability or assoc excess/ purulent sputum or mucus plugs or hemoptysis or cp or chest tightness, subjective wheeze or overt sinus or hb symptoms.    Also denies any obvious fluctuation of symptoms with weather or environmental changes or other aggravating or alleviating factors except as outlined above   No unusual exposure hx or h/o childhood pna/ asthma or knowledge of premature birth.  Current Allergies, Complete Past Medical History, Past Surgical History, Family History, and Social History were reviewed in [employer] record.  ROS  The following are not active complaints unless bolded Hoarseness, sore throat, dysphagia(classic globus sensation) , dental problems, itching, sneezing  nasal congestion or discharge of excess mucus or purulent secretions, ear ache,   fever, chills, sweats, unintended wt loss or wt gain, classically pleuritic or exertional cp,  orthopnea pnd or arm/hand swelling  or leg swelling (R> L chronically) , presyncope, palpitations, abdominal pain, anorexia, nausea, vomiting, diarrhea  or change in bowel habits or  change in bladder habits, change in stools or change in urine, dysuria, hematuria,  rash, arthralgias, visual complaints, headache, numbness, weakness or ataxia or problems with walking or coordination,  change in mood or  memory.            Outpatient Medications Prior to Visit - - NOTE:   Unable to verify as accurately reflecting what pt takes    Medication Sig Dispense Refill   Ascorbic Acid (VITAMIN C) 100 MG tablet Take 100 mg by mouth daily.     aspirin  EC 81 MG tablet Take 1 tablet (81 mg total) by mouth daily.     budesonide-glycopyrrolate -formoterol (BREZTRI AEROSPHERE) 160-9-4.8 MCG/ACT AERO inhaler Inhale 1 puff into the lungs 2 (two) times daily.     furosemide (LASIX) 20 MG tablet Take 20 mg by mouth daily.     lisinopril  (ZESTRIL ) 40 MG tablet Take 1 tablet (40 mg total) by mouth daily. 90 tablet 3   LORazepam  (ATIVAN ) 1 MG tablet Take 1 mg by mouth every 8 (eight) hours as needed for anxiety.     meloxicam (MOBIC) 7.5 MG tablet Take 7.5 mg by mouth daily.     Multiple Vitamin (MULTIVITAMIN) tablet Take 1 tablet by mouth daily. Adult     nitroGLYCERIN  (NITROSTAT ) 0.4 MG SL tablet Place 1 tablet (0.4 mg total) under the tongue every 5 (five) minutes x 3 doses as needed for chest pain. 25 tablet 3   omeprazole (PRILOSEC) 20 MG capsule Take 20 mg by mouth every morning.  potassium chloride (KLOR-CON) 10 MEQ tablet Take 10 mEq by mouth daily.     pravastatin  (PRAVACHOL ) 40 MG tablet Take 40 mg by mouth every evening.      BEVESPI AEROSPHERE 9-4.8 MCG/ACT AERO Inhale 2 puffs into the lungs daily as needed (Shortness of breath). (Patient not taking: Reported on 06/27/2024)     finasteride (PROSCAR) 5 MG tablet Take 5 mg by mouth daily. (Patient not taking: Reported on 06/27/2024)     INCRUSE ELLIPTA 62.5 MCG/ACT AEPB Inhale 1 puff into the lungs daily. (Patient not taking: Reported on 06/27/2024)     OVER THE COUNTER MEDICATION Balance of nature whole produce fruit and veggies dietary  supplement - takes 3 per day     OVER THE COUNTER MEDICATION Take 2 tablets by mouth daily. CBD gummy     pantoprazole  (PROTONIX ) 20 MG tablet Take 20 mg by mouth daily. (Patient not taking: Reported on 06/27/2024)     tamsulosin (FLOMAX) 0.4 MG CAPS capsule Take 0.4 mg by mouth daily. (Patient not taking: Reported on 06/27/2024)     triamcinolone  ointment (KENALOG ) 0.1 % Apply 1 Application topically daily as needed (Red spot). (Patient not taking: Reported on 06/27/2024)     trospium  (SANCTURA ) 20 MG tablet Take 1 tablet (20 mg total) by mouth 2 (two) times daily. (Patient not taking: Reported on 06/27/2024) 60 tablet 11   No facility-administered medications prior to visit.    Past Medical History:  Diagnosis Date   Anxiety    Arthritis    Chronic pain syndrome    Coronary atherosclerosis of native coronary artery    Multivessel, occluded SVG to RCA 2/14 - managed medically   Depression    Essential hypertension    GERD (gastroesophageal reflux disease)    History of bronchitis 20+ yrs ago   History of hiatal hernia    Joint pain    Mixed hyperlipidemia       Objective:     BP (!) 156/88   Pulse 66   Ht 5' 8 (1.727 m)   Wt 227 lb 6.4 oz (103.1 kg)   SpO2 97% Comment: ra  BMI 34.58 kg/m   SpO2: 97 % (ra)    Amb mod obese (by BMI ) wm nad   Min rhonchi/ tensely obese / neg hoover's    HEENT : Oropharynx  M 4 airway   Nasal turbinates     NECK :  without  apparent JVD/ palpable Nodes/TM    LUNGS: no acc muscle use,  Mod kyphotic  contour chest wall with bilateral  slightly decreased bs s audible wheeze and  without cough on insp or exp maneuvers    CV:  RRR  no s3 or murmur or increase in P2, and no edema   ABD:  tensely obese, limited insp excursion   MS:  slow slt awkward gait/ ext warm without deformities Or obvious joint restrictions  calf tenderness, cyanosis or clubbing     SKIN: warm and dry without lesions    NEURO:  alert, approp, nl sensorium with   no motor or cerebellar deficits apparent.         I personally reviewed images and agree with radiology impression as follows:  CXR:   pa and lateral  04/22/24 No active dz My review: mod severe kyphosis     Assessment   DOE (dyspnea on exertion) Active smoker - PFT's  05/04/18  FEV1 1.42 (40 % ) ratio 0.61    with DLCO  15.21 (47%)   and FV curve min concave and ERV 30 at wt 205 - Echo 01/26/24  ok  - 06/27/2024   Walked on RA  @ wt 227 x  3  lap(s) =  approx 450  ft  @ slow to mod  pace, stopped due to end of study  with lowest 02 sats 95% and some sob but mainly back and leg pain    - Trial off ACEi 06/27/2024   Symptoms are disproportionate to objective findings and not clear to what extent this is actually a pulmonary  problem but pt does appear to have difficult to sort out respiratory symptoms of unknown origin for which  DDX  = almost all start with A and  include Adherence, Ace Inhibitors, Acid Reflux, Active Sinus Disease, Alpha 1 Antitripsin deficiency, Anxiety masquerading as Airways dz,  ABPA,  Allergy(esp in young), Aspiration (esp in elderly), Adverse effects of meds,  Active smoking or Vaping, A bunch of PE's/clot burden (a few small clots can't cause this syndrome unless there is already severe underlying pulm or vascular dz with poor reserve),  Anemia or thyroid disorder, plus two Bs  = Bronchiectasis and Beta blocker use..and one C= CHF    Adherence is always the initial prime suspect and is a multilayered concern that requires a trust but verify approach in every patient - starting with knowing how to use medications, especially inhalers, correctly, keeping up with refills and understanding the fundamental difference between maintenance and prns vs those medications only taken for a very short course and then stopped and not refilled.  - really does not know names of meds esp inhalers  - advised return  with all meds in hand using a trust but verify approach to confirm  accurate Medication  Reconciliation The principal here is that until we are certain that the  patients are doing what we've asked, it makes no sense to ask them to do more.   Active smoking > see a/p   ACEi adverse effects at the  top of the usual list of suspects and the only way to rule it out is a trial off > see  hbp a/p    ? Acid (or non-acid) GERD > always difficult to exclude as up to 75% of pts in some series report no assoc GI/ Heartburn symptoms> rec continue max (24h)  acid suppression and diet restrictions/ reviewed     ? Adverse effects of meds eg DPI devices, esp in combination with ACEi and gerd, tend to destabilize the upper airway > may try off his when returns for medication reconciliation     Essential hypertension, benign Trial off ACEi due to choking/globus sensation on GERD rx already 06/27/2024 >>>   In the best review of chronic cough to date ( NEJM 2016 375 1544-1551) ,  ACEi are now felt to cause cough in up to  20% of pts which is a 4 fold increase from previous reports and does not include the variety of non-specific complaints we see in pulmonary clinic in pts on ACEi but previously attributed to another dx like  Copd/asthma and  include PNDS, throat and chest congestion, bronchitis, unexplained dyspnea and noct strangling/choking sensations, globus and hoarseness, but also  atypical /refractory GERD symptoms like dysphagia and bad heartburn   The only way I know  to prove this is not an ACEi Case is a trial off ACEi x a minimum of 6 weeks then regroup.   >>>try change  lisinopril  to benicar  40 mg daily and f/u in 6 weeks, call sooner if needed  Each maintenance medication was reviewed in detail including emphasizing most importantly the difference between maintenance and prns and under what circumstances the prns are to be triggered using an action plan format where appropriate.  Total time for H and P, chart review, counseling, reviewing dpi device(s) ,  directly observing portions of ambulatory 02 saturation study/ and generating customized AVS unique to this office visit / same day charting = 60 min complex new pt eval                Cigarette smoker 4-5 min discussion re active cigarette smoking in addition to office E&M  Ask about tobacco use:   ongoing as is his wife Advise quitting   I took an extended  opportunity with this patient to outline the consequences of continued cigarette use  in airway disorders based on all the data we have from the multiple national lung health studies (perfomed over decades at millions of dollars in cost)  indicating that smoking cessation, not choice of inhalers or pulmonary physicians, is the most important aspect of his  care.   Assess willingness:  Not committed at this point Assist in quit attempt:  Per PCP when ready Arrange follow up:   Follow up per Primary Care planned     Also Low-dose CT lung cancer screening is recommended for patients who are 64-26 years of age with a 20+ pack-year history of smoking and who are currently smoking or quit <=15 years ago. No coughing up blood  No unintentional weight loss of > 15 pounds in the last 6 months - pt is eligible for scanning yearly until age 74 > for now defer to PCP          Ozell America, MD 06/27/2024

## 2024-06-27 ENCOUNTER — Encounter: Payer: Self-pay | Admitting: Internal Medicine

## 2024-06-27 ENCOUNTER — Ambulatory Visit: Admitting: Internal Medicine

## 2024-06-27 VITALS — BP 156/88 | HR 66 | Ht 68.0 in | Wt 227.4 lb

## 2024-06-27 DIAGNOSIS — F17210 Nicotine dependence, cigarettes, uncomplicated: Secondary | ICD-10-CM | POA: Diagnosis not present

## 2024-06-27 DIAGNOSIS — I10 Essential (primary) hypertension: Secondary | ICD-10-CM | POA: Diagnosis not present

## 2024-06-27 DIAGNOSIS — R0609 Other forms of dyspnea: Secondary | ICD-10-CM

## 2024-06-27 MED ORDER — OLMESARTAN MEDOXOMIL 40 MG PO TABS: 40.0000 mg | ORAL_TABLET | Freq: Every day | ORAL | 11 refills | Status: AC

## 2024-06-27 NOTE — Assessment & Plan Note (Signed)
 4-5 min discussion re active cigarette smoking in addition to office E&M  Ask about tobacco use:   ongoing as is his wife Advise quitting   I took an extended  opportunity with this patient to outline the consequences of continued cigarette use  in airway disorders based on all the data we have from the multiple national lung health studies (perfomed over decades at millions of dollars in cost)  indicating that smoking cessation, not choice of inhalers or pulmonary physicians, is the most important aspect of his  care.   Assess willingness:  Not committed at this point Assist in quit attempt:  Per PCP when ready Arrange follow up:   Follow up per Primary Care planned     Also Low-dose CT lung cancer screening is recommended for patients who are 81-5 years of age with a 20+ pack-year history of smoking and who are currently smoking or quit <=15 years ago. No coughing up blood  No unintentional weight loss of > 15 pounds in the last 6 months - pt is eligible for scanning yearly until age 54 > for now defer to PCP

## 2024-06-27 NOTE — Assessment & Plan Note (Signed)
 Trial off ACEi due to choking/globus sensation on GERD rx already 06/27/2024 >>>   In the best review of chronic cough to date ( NEJM 2016 375 8455-8448) ,  ACEi are now felt to cause cough in up to  20% of pts which is a 4 fold increase from previous reports and does not include the variety of non-specific complaints we see in pulmonary clinic in pts on ACEi but previously attributed to another dx like  Copd/asthma and  include PNDS, throat and chest congestion, bronchitis, unexplained dyspnea and noct strangling/choking sensations, globus and hoarseness, but also  atypical /refractory GERD symptoms like dysphagia and bad heartburn   The only way I know  to prove this is not an ACEi Case is a trial off ACEi x a minimum of 6 weeks then regroup.   >>>try change lisinopril  to benicar  40 mg daily and f/u in 6 weeks, call sooner if needed  Each maintenance medication was reviewed in detail including emphasizing most importantly the difference between maintenance and prns and under what circumstances the prns are to be triggered using an action plan format where appropriate.  Total time for H and P, chart review, counseling, reviewing dpi device(s) , directly observing portions of ambulatory 02 saturation study/ and generating customized AVS unique to this office visit / same day charting = 60 min complex new pt eval

## 2024-06-27 NOTE — Patient Instructions (Addendum)
 Stop lisinopril  and olmesartan  40 mg one daily in its place   No other changes for now in medications  The key is to stop smoking completely before smoking completely stops you!   Please schedule a follow up office visit in 6 weeks, call sooner if needed with all medications /inhalers/ solutions in hand so we can verify exactly what you are taking. This includes all medications from all doctors and over the counters

## 2024-06-27 NOTE — Assessment & Plan Note (Addendum)
 Active smoker - PFT's  05/04/18  FEV1 1.42 (40 % ) ratio 0.61    with DLCO  15.21 (47%)   and FV curve min concave and ERV 30 at wt 205 - Echo 01/26/24  ok  - 06/27/2024   Walked on RA  @ wt 227 x  3  lap(s) =  approx 450  ft  @ slow to mod  pace, stopped due to end of study  with lowest 02 sats 95% and some sob but mainly back and leg pain    - Trial off ACEi 06/27/2024   Symptoms are disproportionate to objective findings and not clear to what extent this is actually a pulmonary  problem but pt does appear to have difficult to sort out respiratory symptoms of unknown origin for which  DDX  = almost all start with A and  include Adherence, Ace Inhibitors, Acid Reflux, Active Sinus Disease, Alpha 1 Antitripsin deficiency, Anxiety masquerading as Airways dz,  ABPA,  Allergy(esp in young), Aspiration (esp in elderly), Adverse effects of meds,  Active smoking or Vaping, A bunch of PE's/clot burden (a few small clots can't cause this syndrome unless there is already severe underlying pulm or vascular dz with poor reserve),  Anemia or thyroid disorder, plus two Bs  = Bronchiectasis and Beta blocker use..and one C= CHF    Adherence is always the initial prime suspect and is a multilayered concern that requires a trust but verify approach in every patient - starting with knowing how to use medications, especially inhalers, correctly, keeping up with refills and understanding the fundamental difference between maintenance and prns vs those medications only taken for a very short course and then stopped and not refilled.  - really does not know names of meds esp inhalers  - advised return  with all meds in hand using a trust but verify approach to confirm accurate Medication  Reconciliation The principal here is that until we are certain that the  patients are doing what we've asked, it makes no sense to ask them to do more.   Active smoking > see a/p   ACEi adverse effects at the  top of the usual list of  suspects and the only way to rule it out is a trial off > see  hbp a/p    ? Acid (or non-acid) GERD > always difficult to exclude as up to 75% of pts in some series report no assoc GI/ Heartburn symptoms> rec continue max (24h)  acid suppression and diet restrictions/ reviewed     ? Adverse effects of meds eg DPI devices, esp in combination with ACEi and gerd, tend to destabilize the upper airway > may try off his when returns for medication reconciliation

## 2024-07-01 ENCOUNTER — Telehealth: Payer: Self-pay | Admitting: Nurse Practitioner

## 2024-07-01 DIAGNOSIS — I7121 Aneurysm of the ascending aorta, without rupture: Secondary | ICD-10-CM

## 2024-07-01 NOTE — Telephone Encounter (Signed)
 Checking percert on the following patient for testing scheduled at [facility].     CT ANGIO CHEST PE W/CM &/OR WO    07-14-24

## 2024-07-01 NOTE — Telephone Encounter (Addendum)
 Patient informed and verbalized understanding of plan. Labs sent to patient PCP to be done Monday  Ordered test and routed to scheduling to schedule    Per peck: Hey, could we please order a BMET and then a CTA of chest for diagnosis of ascending aortic aneurysm? Thanks!

## 2024-07-04 DIAGNOSIS — L570 Actinic keratosis: Secondary | ICD-10-CM | POA: Diagnosis not present

## 2024-07-04 DIAGNOSIS — L218 Other seborrheic dermatitis: Secondary | ICD-10-CM | POA: Diagnosis not present

## 2024-07-14 ENCOUNTER — Other Ambulatory Visit (HOSPITAL_COMMUNITY)
Admission: RE | Admit: 2024-07-14 | Discharge: 2024-07-14 | Disposition: A | Discharge status: Home / Self Care | Source: Ambulatory Visit | Attending: Nurse Practitioner | Admitting: Nurse Practitioner

## 2024-07-14 ENCOUNTER — Other Ambulatory Visit: Payer: Self-pay | Admitting: Nurse Practitioner

## 2024-07-14 ENCOUNTER — Ambulatory Visit (HOSPITAL_COMMUNITY)
Admission: RE | Admit: 2024-07-14 | Discharge: 2024-07-14 | Disposition: A | Discharge status: Home / Self Care | Source: Ambulatory Visit | Attending: Nurse Practitioner | Admitting: Nurse Practitioner

## 2024-07-14 DIAGNOSIS — I7121 Aneurysm of the ascending aorta, without rupture: Secondary | ICD-10-CM | POA: Diagnosis not present

## 2024-07-14 DIAGNOSIS — I700 Atherosclerosis of aorta: Secondary | ICD-10-CM | POA: Diagnosis not present

## 2024-07-14 LAB — BASIC METABOLIC PANEL WITH GFR
Anion gap: 10 (ref 5–15)
BUN: 7 mg/dL — ABNORMAL LOW (ref 8–23)
CO2: 26 mmol/L (ref 22–32)
Calcium: 9.1 mg/dL (ref 8.9–10.3)
Chloride: 96 mmol/L — ABNORMAL LOW (ref 98–111)
Creatinine, Ser: 0.81 mg/dL (ref 0.61–1.24)
GFR, Estimated: >60 mL/min (ref >60)
Glucose, Bld: 144 mg/dL — ABNORMAL HIGH (ref 70–99)
Potassium: 3.7 mmol/L (ref 3.5–5.1)
Sodium: 132 mmol/L — ABNORMAL LOW (ref 135–145)

## 2024-07-14 MED ORDER — IOHEXOL 350 MG/ML SOLN
75.0000 mL | Freq: Once | INTRAVENOUS | Status: AC | PRN
Start: 2024-07-14 — End: 2024-07-14
  Administered 2024-07-14: 75 mL via INTRAVENOUS

## 2024-07-14 NOTE — Addendum Note (Signed)
 Addended by: Malayzia Laforte on: 07/14/2024 08:14 AM   Modules accepted: Orders

## 2024-07-25 ENCOUNTER — Ambulatory Visit

## 2024-07-25 VITALS — BP 142/86 | HR 75 | Resp 18 | Ht 68.0 in | Wt 229.0 lb

## 2024-07-25 DIAGNOSIS — I712 Thoracic aortic aneurysm, without rupture, unspecified: Secondary | ICD-10-CM | POA: Diagnosis not present

## 2024-07-25 NOTE — Progress Notes (Signed)
 [address] [place] [address]             [phone]            Brandon Pratt [id number] 12/21/1955   History of Present Illness:  [name] is a 68 year old male with medical history of hypertension, coronary atherosclerosis of native coronary artery, s/p CABG in 2011, tobacco abuse, and hyperlipidemia who presents for initial encounter of ascend thoracic aortic aneurysm.  Aneurysm was initially found incidentally on echocardiogram on 01/2024.  Echocardiogram showed tricuspid aortic valve measured aneurysm at 4.4 cm.  CTA of chest was obtained on 07/14/2024 which measured aneurysm at 4.0 cm.  [name] presents to the clinic today with his wife and reports that he is doing well.  His blood pressure is slightly elevated at today's visit.  He is currently in the process of switching medications due to possibility of lisinopril  causing chronic cough.  He was switched to olmesartan  and has been taking this for the past month.  He does not check his blood pressure at home.  He is still smoking roughly 1.5 packs/day.  He is unsure if he is ready to quit smoking at this time.  He does not exercise and has dyspnea on exertion.  He has been evaluated by pulmonology for this.  He denies chest pain and lower leg swelling.     Current Outpatient Medications on File Prior to Visit  Medication Sig Dispense Refill   Ascorbic Acid (VITAMIN C) 100 MG tablet Take 100 mg by mouth daily.     aspirin  EC 81 MG tablet Take 1 tablet (81 mg total) by mouth daily.     Coenzyme Q10 (CO Q 10) 100 MG CAPS Take by mouth.     Collagen-Vitamin C-Biotin (COLLAGEN 1500/C PO) Take by mouth.     cyanocobalamin (VITAMIN B12) 500 MCG tablet Take 500 mcg by mouth daily.     Fluticasone-Umeclidin-Vilant (TRELEGY ELLIPTA) 100-62.5-25 MCG/ACT AEPB Inhale into the lungs.     ketoconazole (NIZORAL) 2 % cream Apply 1 Application topically daily.     LORazepam  (ATIVAN ) 1 MG tablet Take 1  mg by mouth every 8 (eight) hours as needed for anxiety.     meloxicam (MOBIC) 7.5 MG tablet Take 7.5 mg by mouth daily.     Multiple Vitamin (MULTIVITAMIN) tablet Take 1 tablet by mouth daily. Adult     nitroGLYCERIN  (NITROSTAT ) 0.4 MG SL tablet Place 1 tablet (0.4 mg total) under the tongue every 5 (five) minutes x 3 doses as needed for chest pain. 25 tablet 3   olmesartan  (BENICAR ) 40 MG tablet Take 1 tablet (40 mg total) by mouth daily. 30 tablet 11   omeprazole (PRILOSEC) 20 MG capsule Take 20 mg by mouth every morning.      OVER THE COUNTER MEDICATION Balance of nature whole produce fruit and veggies dietary supplement - takes 3 per day     OVER THE COUNTER MEDICATION Take 2 tablets by mouth daily. CBD gummy     OVER THE COUNTER MEDICATION [place]  Mushroom     potassium chloride (KLOR-CON) 10 MEQ tablet Take 10 mEq by mouth daily.     pravastatin  (PRAVACHOL ) 40 MG tablet Take 40 mg by mouth every evening.      Turmeric (QC TUMERIC COMPLEX PO) Take by mouth.     No current facility-administered medications on file prior to visit.  ROS: Review of Systems  Respiratory:  Positive for cough and shortness of breath. Negative for wheezing.        SOB on exertion  Cardiovascular:  Negative for chest pain and leg swelling.  Gastrointestinal:  Positive for heartburn.  Musculoskeletal:  Positive for joint pain.  Neurological:  Positive for headaches.  Psychiatric/Behavioral:  The patient is nervous/anxious.      BP (!) 142/86 (BP Location: Left Arm)   Pulse 75   Resp 18   Ht 5' 8 (1.727 m)   Wt 229 lb (103.9 kg)   SpO2 95%   BMI 34.82 kg/m   Physical Exam Constitutional:      Appearance: Normal appearance.  HENT:     Head: Normocephalic and atraumatic.  Cardiovascular:     Rate and Rhythm: Normal rate and regular rhythm.     Heart sounds: Normal heart sounds, S1 normal and S2 normal.  Pulmonary:     Effort: Pulmonary effort is normal.     Breath sounds: Normal  breath sounds.  Skin:    General: Skin is warm and dry.  Neurological:     General: No focal deficit present.     Mental Status: He is alert and oriented to person, place, and time.        Imaging: CLINICAL DATA:  History of ascending thoracic aortic aneurysm without rupture   EXAM: CT ANGIOGRAPHY CHEST WITH CONTRAST   TECHNIQUE: Multidetector CT imaging of the chest was performed using the standard protocol during bolus administration of intravenous contrast. Multiplanar CT image reconstructions and MIPs were obtained to evaluate the vascular anatomy.   RADIATION DOSE REDUCTION: This exam was performed according to the departmental dose-optimization program which includes automated exposure control, adjustment of the mA and/or kV according to patient size and/or use of iterative reconstruction technique.   CONTRAST:  75mL OMNIPAQUE  IOHEXOL  350 MG/ML SOLN   COMPARISON:  None Available.   FINDINGS: Cardiovascular: The ascending thoracic aorta measures 4 cm and diameter, measured just above the sino-tubular junction. Normal caliber of the aortic arch and descending thoracic aorta. No evidence of thoracic aortic dissection. Atherosclerosis throughout the aortic arch.   Postsurgical changes are seen from previous CABG, with saphenous vein grafts off the anterior aorta to the LAD and circumflex distributions.   Timing of the contrast bolus limits evaluation of the pulmonary artery. The heart is unremarkable without pericardial effusion.   Mediastinum/Nodes: No enlarged mediastinal, hilar, or axillary lymph nodes. Thyroid gland, trachea, and esophagus demonstrate no significant findings.   Lungs/Pleura: No acute airspace disease, effusion, or pneumothorax. The central airways are patent. There is a 5 mm mean diameter right upper lobe pulmonary nodule, reference image 67/7.   Upper Abdomen: No acute abnormality.   Musculoskeletal: No acute or destructive bony  abnormalities. Reconstructed images demonstrate no additional findings.   Review of the MIP images confirms the above findings.   IMPRESSION: 1. 4 cm ascending thoracic aortic aneurysm without evidence of dissection. Recommend annual imaging followup by CTA or MRA. This recommendation follows 2010 ACCF/AHA/AATS/ACR/ASA/SCA/SCAI/SIR/STS/SVM Guidelines for the Diagnosis and Management of Patients with Thoracic Aortic Disease. Circulation. 2010; 121: Z733-z630. Aortic aneurysm NOS (ICD10-I71.9) 2. Right solid pulmonary nodule within the upper lobe measuring 5 mm. Per Fleischner Society Guidelines, if patient is low risk for malignancy, no routine follow-up imaging is recommended. If patient is high risk for malignancy, a non-contrast Chest CT at 12 months is optional. If performed and the nodule is stable at 12 months, no further  follow-up is recommended. These guidelines do not apply to immunocompromised patients and patients with cancer. Follow up in patients with significant comorbidities as clinically warranted. For lung cancer screening, adhere to Lung-RADS guidelines. Reference: Radiology. 2017; 284(1):228-43. 3.  Aortic Atherosclerosis (ICD10-I70.0).     Electronically Signed   By: Ozell Daring M.D.   On: 07/14/2024 10:32     A/P: Thoracic aortic aneurysm without rupture, unspecified part (HCC) -4.0 cm ascending thoracic aortic aneurysm.  Echocardiogram on 01/2024 showed tricuspid aortic valve. We discussed the natural history and and risk factors for growth of ascending aortic aneurysms. Discussed recommendations to minimize the risk of further expansion or dissection including careful blood pressure control, avoidance of contact sports and heavy lifting, attention to lipid management.  We covered the importance of smoking cessation.  The patient does not yet meet surgical criteria of >5.5cm. The patient is aware of signs and symptoms of aortic dissection and when to present  to the emergency department   -Follow-up in 1 year with CTA of chest for continued surveillance   Risk Modification:  Statin:  pravastatin   Smoking cessation instruction/counseling given:  counseled patient on the dangers of tobacco use, advised patient to stop smoking, and reviewed strategies to maximize success  Patient was counseled on importance of Blood Pressure Control  They are instructed to contact their Primary Care Physician if they start to have blood pressure readings over 130s/90s. Do not ever stop blood pressure medications on your own, unless instructed by healthcare professional.  Please avoid use of Fluoroquinolones as this can potentially increase your risk of Aortic Rupture and/or Dissection  Patient educated on signs and symptoms of Aortic Dissection, handout also provided in AVS  Pulmonary Nodule -Right solid pulmonary nodule within the upper lobe measuring 5 mm on CTA of chest. Recommended CT of chest in one year for continued monitoring, which will be obtained due to aortic aneurysm   Manuelita CHRISTELLA Rough, PA-C 07/25/24

## 2024-07-25 NOTE — Patient Instructions (Signed)

## 2024-08-07 NOTE — Progress Notes (Unsigned)
 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 Really helping / also bevespi and incruse s benefit 02: none  Rec Stop lisinopril  and olmesartan  40 mg one daily in its place  No other changes for now in medications The key is to stop smoking completely before smoking completely stops you! Please schedule a follow up office visit in 6 weeks, call sooner if needed with all medications /inhalers/ solutions in hand     08/08/2024  f/u ov/[place] office/Marija Calamari re: GOLD 3 copd/doe/cough ? 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    Also denies any obvious fluctuation of symptoms with weather or environmental changes or other aggravating or alleviating factors except as outlined above   No unusual exposure hx  or h/o childhood pna/ asthma or knowledge of premature birth.  Current Allergies, Complete Past Medical History, Past Surgical History, Family History, and Social History were reviewed in [employer] record.  ROS  The following are not active complaints unless bolded Hoarseness, sore throat, dysphagia, dental problems, itching, sneezing,  nasal congestion or discharge of excess mucus or purulent secretions, ear ache,   fever, chills, sweats, unintended wt loss or wt gain, classically pleuritic or exertional cp,  orthopnea pnd or arm/hand swelling  or leg swelling, presyncope, palpitations, abdominal pain, anorexia, nausea, vomiting, diarrhea  or change in bowel habits or change in bladder habits, change in stools or change in urine, dysuria, hematuria,  rash, arthralgias, visual complaints, headache, numbness, weakness or ataxia or problems with walking or coordination,  change in mood or  memory.        Current Meds  Medication Sig   Ascorbic Acid (VITAMIN C) 100 MG tablet Take 100 mg by mouth daily.   aspirin  EC 81 MG tablet Take 1 tablet (81 mg total) by mouth daily.   Coenzyme Q10 (CO Q 10) 100 MG CAPS Take by mouth.   Collagen-Vitamin C-Biotin (COLLAGEN 1500/C PO) Take by mouth.   cyanocobalamin (VITAMIN B12) 500 MCG tablet Take 500 mcg by mouth daily.   Fluticasone-Umeclidin-Vilant (TRELEGY ELLIPTA) 100-62.5-25 MCG/ACT AEPB Inhale into  the lungs.   ketoconazole (NIZORAL) 2 % cream Apply 1 Application topically daily.   LORazepam  (ATIVAN ) 1 MG tablet Take 1 mg by mouth every 8 (eight) hours as needed for anxiety.   meloxicam (MOBIC) 7.5 MG tablet Take 7.5 mg by mouth daily.   Multiple Vitamin (MULTIVITAMIN) tablet Take 1 tablet by mouth daily. Adult   nitroGLYCERIN  (NITROSTAT ) 0.4 MG SL tablet Place 1 tablet (0.4 mg total) under the tongue every 5 (five) minutes x 3 doses as needed for chest pain.   olmesartan  (BENICAR ) 40 MG tablet Take 1 tablet (40 mg total) by  mouth daily.   omeprazole (PRILOSEC) 20 MG capsule Take 20 mg by mouth every morning.    OVER THE COUNTER MEDICATION Balance of nature whole produce fruit and veggies dietary supplement - takes 3 per day   OVER THE COUNTER MEDICATION Take 2 tablets by mouth daily. CBD gummy   OVER THE COUNTER MEDICATION [place]  Mushroom   potassium chloride (KLOR-CON) 10 MEQ tablet Take 10 mEq by mouth daily.   pravastatin  (PRAVACHOL ) 40 MG tablet Take 40 mg by mouth every evening.    Turmeric (QC TUMERIC COMPLEX PO) Take by mouth.            Past Medical History:  Diagnosis Date   Anxiety    Arthritis    Chronic pain syndrome    Coronary atherosclerosis of native coronary artery    Multivessel, occluded SVG to RCA 2/14 - managed medically   Depression    Essential hypertension    GERD (gastroesophageal reflux disease)    History of bronchitis 20+ yrs ago   History of hiatal hernia    Joint pain    Mixed hyperlipidemia       Objective:     Wt Readings from Last 3 Encounters:  08/08/24 228 lb (103.4 kg)  07/25/24 229 lb (103.9 kg)  06/27/24 227 lb 6.4 oz (103.1 kg)      Vital signs reviewed  08/08/2024  - Note at rest 02 sats  94% on RA   General appearance:    elderly mildly hoarse mod obese (by bmi)  white male  nad      HEENT :  Oropharynx  clear   Nasal turbinates nl    NECK :  without JVD/Nodes/TM/ nl carotid upstrokes bilaterally   LUNGS: no acc muscle use,  Mildd barrel/kyphotic  contour chest wall with bilateral  Distant bs s audible wheeze and  without cough on insp or exp maneuvers and mod  Hyperresonant  to  percussion bilaterally     CV:  RRR  no s3 or murmur or increase in P2, and no edema   ABD:  obese soft and nontender   MS:   Ext warm without deformities or   obvious joint restrictions , calf tenderness, cyanosis or clubbing  SKIN: warm and dry without lesions    NEURO:  alert, approp, nl sensorium with  no motor or cerebellar deficits apparent.           I personally reviewed images and agree with radiology impression as follows:   Chest CTa   07/14/24  No acute airspace disease, effusion, or pneumothorax. The central airways are patent. There is a 5 mm mean diameter right upper lobe pulmonary nodule      Assessment     Assessment & Plan DOE (dyspnea on exertion)  COPD GOLD Active smoker - PFT's  05/04/18  FEV1 1.42 (40 % ) ratio 0.61  with DLCO  15.21 (47%)   and FV curve min concave and ERV 30 at wt 205 - Echo 01/26/24  ok  - 06/27/2024   Walked on RA  @ wt 227 x  3  lap(s) =  approx 450  ft  @ slow to mod  pace, stopped due to end of study  with lowest 02 sats 95% and some sob but mainly back and leg pain    - Trial off ACEi 06/27/2024  - 08/08/2024  After extensive coaching inhaler device,  effectiveness =    60% with hfa> breztri  x 2 sample trial and stop trelegy / use neb saba  up to 4 h as backup   Group D (now reclassified as E) in terms of symptom/risk and laba/lama/ICS  therefore appropriate rx at this point >>>  breztri  favored over dpi due to apparent upper airway cough > if no change in cough consider stiolto next ov    Essential hypertension, benign Trial off ACEi due to choking/globus sensation on GERD rx already 06/27/2024 >>> less globus 08/08/2024 off acei    Although even in retrospect it may not be clear the ACEi contributed to the pt's symptoms, globus sensation  improved off them and adding them back at this point or in the future would risk confusion in interpretation of non-specific respiratory symptoms to which this patient is prone  ie  Better not to muddy the waters here.  >> continue benicar  40 mg daily and f/u with PCP for further titration of bp meds if needed   Cigarette smoker Counseled re importance of smoking cessation but did not meet time criteria for separate billing     >>> continue yearly CTa for Aorta and f/u RUL 5 mm nodule  Discussed in detail all the  indications, usual  risks and alternatives   relative to the benefits with patient who agrees to proceed with w/u as outlined.      Each maintenance medication was reviewed in detail including emphasizing most importantly the difference between maintenance and prns and under what circumstances the prns are to be triggered using an action plan format where appropriate.  Total time for H and P, chart review, counseling, reviewing hfa device(s) and generating customized AVS unique to this office visit / same day charting = 30 min          AVS  Patient Instructions  Plan A = Automatic = Always=    Breztri  Take 2 puffs first thing in am and then another 2 puffs about 12 hours later.    Work on inhaler technique:  relax and gently blow all the way out then take a nice smooth full deep breath back in, triggering the inhaler at same time you start breathing in.  Hold breath in for at least  5 seconds if you can. Blow out breztri   thru nose. Rinse and gargle with water  when done.  If mouth or throat bother you at all,  try brushing teeth/gums/tongue with arm and hammer toothpaste/ make a slurry and gargle and spit out.   >>>  Remember how golfers warm up by taking practice swings - do this with an empty inhaler     Plan C = Back up -  - only use your albuterol  nebulizer if you've  taken your breztri  w/in 12 hours  > ok to use the nebulizer up to every 4 hours but if start needing it regularly call for immediate appointment    Please schedule a  follow up visit in 3 months but call sooner if needed    Ozell America, MD 08/08/2024

## 2024-08-08 ENCOUNTER — Ambulatory Visit: Admitting: Internal Medicine

## 2024-08-08 ENCOUNTER — Encounter: Payer: Self-pay | Admitting: Internal Medicine

## 2024-08-08 VITALS — BP 152/98 | HR 93 | Ht 68.0 in | Wt 228.0 lb

## 2024-08-08 DIAGNOSIS — F17210 Nicotine dependence, cigarettes, uncomplicated: Secondary | ICD-10-CM | POA: Diagnosis not present

## 2024-08-08 DIAGNOSIS — J449 Chronic obstructive pulmonary disease, unspecified: Secondary | ICD-10-CM | POA: Diagnosis not present

## 2024-08-08 DIAGNOSIS — I10 Essential (primary) hypertension: Secondary | ICD-10-CM

## 2024-08-08 DIAGNOSIS — R0609 Other forms of dyspnea: Secondary | ICD-10-CM

## 2024-08-08 MED ORDER — BREZTRI AEROSPHERE 160-9-4.8 MCG/ACT IN AERO: 2.0000 | INHALATION_SPRAY | Freq: Two times a day (BID) | RESPIRATORY_TRACT | Status: AC

## 2024-08-08 NOTE — Assessment & Plan Note (Addendum)
 Active smoker - PFT's  05/04/18  FEV1 1.42 (40 % ) ratio 0.61    with DLCO  15.21 (47%)   and FV curve min concave and ERV 30 at wt 205 - Echo 01/26/24  ok  - 06/27/2024   Walked on RA  @ wt 227 x  3  lap(s) =  approx 450  ft  @ slow to mod  pace, stopped due to end of study  with lowest 02 sats 95% and some sob but mainly back and leg pain    - Trial off ACEi 06/27/2024  - 08/08/2024  After extensive coaching inhaler device,  effectiveness =    60% with hfa> breztri  x 2 sample trial and stop trelegy / use neb saba  up to 4 h as backup   Group D (now reclassified as E) in terms of symptom/risk and laba/lama/ICS  therefore appropriate rx at this point >>>  breztri  favored over dpi due to apparent upper airway cough > if no change in cough consider stiolto next ov

## 2024-08-08 NOTE — Assessment & Plan Note (Addendum)
 Trial off ACEi due to choking/globus sensation on GERD rx already 06/27/2024 >>> less globus 08/08/2024 off acei    Although even in retrospect it may not be clear the ACEi contributed to the pt's symptoms, globus sensation  improved off them and adding them back at this point or in the future would risk confusion in interpretation of non-specific respiratory symptoms to which this patient is prone  ie  Better not to muddy the waters here.  >> continue benicar  40 mg daily and f/u with PCP for further titration of bp meds if needed

## 2024-08-08 NOTE — Assessment & Plan Note (Addendum)
 Counseled re importance of smoking cessation but did not meet time criteria for separate billing     >>> continue yearly CTa for Aorta and f/u RUL 5 mm nodule  Discussed in detail all the  indications, usual  risks and alternatives  relative to the benefits with patient who agrees to proceed with w/u as outlined.      Each maintenance medication was reviewed in detail including emphasizing most importantly the difference between maintenance and prns and under what circumstances the prns are to be triggered using an action plan format where appropriate.  Total time for H and P, chart review, counseling, reviewing hfa device(s) and generating customized AVS unique to this office visit / same day charting = 30 min

## 2024-08-08 NOTE — Patient Instructions (Addendum)
 Plan A = Automatic = Always=    Breztri  Take 2 puffs first thing in am and then another 2 puffs about 12 hours later.    Work on inhaler technique:  relax and gently blow all the way out then take a nice smooth full deep breath back in, triggering the inhaler at same time you start breathing in.  Hold breath in for at least  5 seconds if you can. Blow out breztri   thru nose. Rinse and gargle with water  when done.  If mouth or throat bother you at all,  try brushing teeth/gums/tongue with arm and hammer toothpaste/ make a slurry and gargle and spit out.   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

## 2024-08-11 ENCOUNTER — Ambulatory Visit: Payer: Self-pay | Admitting: Nurse Practitioner

## 2024-08-15 DIAGNOSIS — I10 Essential (primary) hypertension: Secondary | ICD-10-CM | POA: Diagnosis not present

## 2024-08-15 DIAGNOSIS — R7301 Impaired fasting glucose: Secondary | ICD-10-CM | POA: Diagnosis not present

## 2024-08-15 DIAGNOSIS — E782 Mixed hyperlipidemia: Secondary | ICD-10-CM | POA: Diagnosis not present

## 2024-08-22 DIAGNOSIS — I10 Essential (primary) hypertension: Secondary | ICD-10-CM | POA: Diagnosis not present

## 2024-08-22 DIAGNOSIS — I2510 Atherosclerotic heart disease of native coronary artery without angina pectoris: Secondary | ICD-10-CM | POA: Diagnosis not present

## 2024-08-22 DIAGNOSIS — R7301 Impaired fasting glucose: Secondary | ICD-10-CM | POA: Diagnosis not present

## 2024-08-22 DIAGNOSIS — J449 Chronic obstructive pulmonary disease, unspecified: Secondary | ICD-10-CM | POA: Diagnosis not present

## 2024-08-22 DIAGNOSIS — G8929 Other chronic pain: Secondary | ICD-10-CM | POA: Diagnosis not present

## 2024-08-22 DIAGNOSIS — R2689 Other abnormalities of gait and mobility: Secondary | ICD-10-CM | POA: Diagnosis not present

## 2024-08-22 DIAGNOSIS — K219 Gastro-esophageal reflux disease without esophagitis: Secondary | ICD-10-CM | POA: Diagnosis not present

## 2024-08-22 DIAGNOSIS — Z23 Encounter for immunization: Secondary | ICD-10-CM | POA: Diagnosis not present

## 2024-08-22 DIAGNOSIS — E782 Mixed hyperlipidemia: Secondary | ICD-10-CM | POA: Diagnosis not present

## 2024-08-22 DIAGNOSIS — M25561 Pain in right knee: Secondary | ICD-10-CM | POA: Diagnosis not present

## 2024-08-22 DIAGNOSIS — M25562 Pain in left knee: Secondary | ICD-10-CM | POA: Diagnosis not present

## 2024-08-29 ENCOUNTER — Encounter: Payer: Self-pay | Admitting: Nurse Practitioner

## 2024-08-29 ENCOUNTER — Ambulatory Visit: Attending: Nurse Practitioner | Admitting: Nurse Practitioner

## 2024-08-29 VITALS — BP 150/80 | HR 70 | Ht 69.0 in | Wt 227.6 lb

## 2024-08-29 DIAGNOSIS — I7121 Aneurysm of the ascending aorta, without rupture: Secondary | ICD-10-CM

## 2024-08-29 DIAGNOSIS — I2510 Atherosclerotic heart disease of native coronary artery without angina pectoris: Secondary | ICD-10-CM

## 2024-08-29 DIAGNOSIS — Z720 Tobacco use: Secondary | ICD-10-CM

## 2024-08-29 DIAGNOSIS — I6523 Occlusion and stenosis of bilateral carotid arteries: Secondary | ICD-10-CM | POA: Diagnosis not present

## 2024-08-29 DIAGNOSIS — E782 Mixed hyperlipidemia: Secondary | ICD-10-CM | POA: Diagnosis not present

## 2024-08-29 DIAGNOSIS — R0602 Shortness of breath: Secondary | ICD-10-CM

## 2024-08-29 DIAGNOSIS — I10 Essential (primary) hypertension: Secondary | ICD-10-CM | POA: Diagnosis not present

## 2024-08-29 MED ORDER — AMLODIPINE BESYLATE 2.5 MG PO TABS: 2.5000 mg | ORAL_TABLET | Freq: Every day | ORAL | 3 refills | Status: DC

## 2024-08-29 NOTE — Progress Notes (Unsigned)
 Cardiology Office Note:  .   Date: 08/29/2024 ID:  Brandon Pratt, DOB 30-Jun-1956, MRN [id number] PCP: Shona Norleen PEDLAR, MD  [facility] Providers Cardiologist:  Jayson Sierras, MD    History of Present Illness: .   Brandon Pratt is a 68 y.o. male with a PMH of CAD, s/p CABG, mixed hyperlipidemia, history of tobacco abuse, who presents today for scheduled follow-up.  Last seen by Dr. Sierras on August 07, 2022.  He was doing well at the time.  01/11/2024 - Today he presents for 1 year follow-up.  He states since he was last seen in our office, he has had an eye operation, and skin cancer removed from his right forearm. Admits to shortness of breath that has been ongoing over the past year. Says this happens intermittently, difficult to tell when he last experienced symptoms, says to his knowledge he has not experienced shortness of breath in the past month. Denies any chest pain, palpitations, syncope, presyncope, dizziness, orthopnea, PND, swelling or significant weight changes, acute bleeding, or claudication.   04/21/2024 - Presents today for follow-up with his wfie.  Says his breathing is still stable from last office visit.  He reports a chronic feeling of being off balance for the last 2 years, admits to frequent falls.  Wife says he has a fall about once a month.  Denies any acute injuries or head injuries per his report.  Denies any chest pain, palpitations, syncope, presyncope, dizziness, orthopnea, PND, swelling or significant weight changes, acute bleeding, or claudication. Wife says he has been having moaning when breathing for the past 6-8 months.   06/17/2024 - Here for follow-up. Denies any acute cardiac complaints or issues. Wife says he has not had any more recurrent falls since I have last seen him. Balance seems to be some better. Denies any chest pain, worsening shortness of breath, palpitations, syncope, presyncope, dizziness, orthopnea, PND, swelling or significant weight  changes, acute bleeding, or claudication.  08/29/2024 -  Here for follow-up with his wife.  Only chief concern is wife has noticed that his BPs tend to be more elevated. Breathing is stable. Denies any chest pain, palpitations, syncope, presyncope, dizziness, orthopnea, PND, swelling or significant weight changes, acute bleeding, or claudication.   ROS: Negative. See HPI.   Studies Reviewed: SABRA    EKG: EKG is not ordered today.  Carotid duplex 01/2024:  Summary:  Right Carotid: Velocities in the right ICA are consistent with a 1-39%  stenosis. Non-hemodynamically significant plaque <50% noted in the  CCA. The ECA appears <50% stenosed.   Left Carotid: Velocities in the left ICA are consistent with a 1-39%  stenosis. Non-hemodynamically significant plaque <50% noted in the  CCA. The ECA appears <50% stenosed.   Vertebrals:  Bilateral vertebral arteries demonstrate antegrade flow.  Subclavians: Normal flow hemodynamics were seen in bilateral subclavian arteries.   *See table(s) above for measurements and observations.  Suggest follow up study in When clinically indicated. Compared to  02/15/2020 no significant change.  Echo 01/2024:  1. Left ventricular ejection fraction, by estimation, is 65 to 70%. The  left ventricle has normal function. The left ventricle has no regional  wall motion abnormalities. There is moderate concentric left ventricular  hypertrophy. Left ventricular diastolic parameters are consistent with Grade I diastolic dysfunction (impaired relaxation).   2. Right ventricular systolic function is normal. The right ventricular  size is normal. Tricuspid regurgitation signal is inadequate for assessing  PA pressure.   3.  The mitral valve is grossly normal. Trivial mitral valve  regurgitation.   4. The aortic valve is tricuspid. There is mild calcification of the  aortic valve. Aortic valve regurgitation is not visualized. Aortic valve  sclerosis is present, with no  evidence of aortic valve stenosis. Aortic  valve mean gradient measures 5.0 mmHg.   5. Aortic dilatation noted. There is moderate dilatation of the ascending  aorta, measuring 44 mm.   6. The inferior vena cava is normal in size with greater than 50%  respiratory variability, suggesting right atrial pressure of 3 mmHg.   Comparison(s): No prior Echocardiogram.  Carotid duplex 01/2020:  Summary:  Right Carotid: Velocities in the right ICA are consistent with a 1-39%  stenosis.   Left Carotid: Velocities in the left ICA are consistent with a 1-39%  stenosis.   Vertebrals: Bilateral vertebral arteries demonstrate antegrade flow.  Subclavians: Normal flow hemodynamics were seen in bilateral subclavian arteries.   *See table(s) above for measurements and observations.  ETT with Nuclear Stress Test 12/2012:  This study was performed using the standard Bruce exercise protocol.  The patient exercised into stage III reaching under 60 bpm, 98% MPHR.  Maximum METS of 7 were achieved.  The patient experienced no chest pain.  No clearly diagnostic ST segment changes, no arrhythmias.  An adequate level of stress was achieved. Equivocal LV perfusion, overall low risk study.  Limitations in artifact were due to use of diaphragm activity.  Partially reversible inferior defect noted in the setting of gut uptake adjacent to the inferior wall at rest -cannot exclude mild ischemia in this distribution.  The patient's calculated post-rest LVEF was 66%. TID ratio 1.  Physical Exam:   VS:  BP (!) 150/80   Pulse 70   Ht 5' 9 (1.753 m)   Wt 227 lb 9.6 oz (103.2 kg)   SpO2 95%   BMI 33.61 kg/m    Wt Readings from Last 3 Encounters:  08/29/24 227 lb 9.6 oz (103.2 kg)  08/08/24 228 lb (103.4 kg)  07/25/24 229 lb (103.9 kg)     GEN: Obese, 68 y.o. male in no acute distress NECK: No JVD; carotid bruits noted bilaterally CARDIAC: S1/S2, RRR, no murmurs, rubs, gallops RESPIRATORY:  Clear and diminished  breath sounds with minimal expiratory wheezing noted bilaterally on exam. No rhonchi or rales noted. Moaning noted during breathing.  ABDOMEN: Soft, non-tender, non-distended EXTREMITIES:  No edema; No deformity   ASSESSMENT AND PLAN: .    CAD, s/p CABG Stable with no anginal symptoms. Does admit to stable DOE.  Did discuss NST but came to shared medical decision that we will hold off at this time.  Continue aspirin , lisinopril , pravastatin , and nitroglycerin  as needed. Heart healthy diet and regular cardiovascular exercise encouraged. Care and ED precautions discussed.  If patient were to have any development of chest pain or progressive dyspnea on exertion, plan to revisit ischemic evaluation.  Mixed HLD Most recent labs faxed to our office show stable labs. Continue pravastatin . Heart healthy diet and regular cardiovascular exercise encouraged.  3. HTN SBP averaging 140's - 150's. Discussed SBP goal < 140. Will begin Norvasc 2.5 mg daily. He will update us  in 1-2 weeks with his BP readings. Continue olmesartan . Discussed to monitor BP at home at least 2 hours after medications and sitting for 5-10 minutes. Heart healthy diet and regular cardiovascular exercise encouraged.   4. Carotid artery disease Denies any symptoms.  Most recent carotid duplex showed stable bilateral carotid  artery stenosis.  Will continue to monitor.  No medication changes at this time. Heart healthy diet encouraged.   5. Shortness of breath Breathing is stable. Continue to f/u with pulmonology as scheduled.   6. Ascending aortic aneurysm 4 cm ascending thoracic aortic aneurysm noted on CT scan in August 2025.  There was an incidental finding of right solid pulmonary nodule within upper lobe measuring 5 mm.  Recommended to repeat CT scan in 12 months. Previous referral placed to cardiothoracic surgery to monitor/manage, appears this has been closed. Plan to update CT scan in 1 year.    7. Tobacco use Smoking  cessation encouraged and discussed.    Dispo: Care and ED precautions discussed. Follow-up with [facility] in 6 months or sooner if anything changes.  Signed, Almarie Crate, NP

## 2024-08-29 NOTE — Patient Instructions (Signed)
 Medication Instructions:  Your physician has recommended you make the following change in your medication:   -Start Norvasc (Amlodipine) 2.5 mg once daily   *If you need a refill on your cardiac medications before your next appointment, please call your pharmacy*  Lab Work: None If you have labs (blood work) drawn today and your tests are completely normal, you will receive your results only by: MyChart Message (if you have MyChart) OR A paper copy in the mail If you have any lab test that is abnormal or we need to change your treatment, we will call you to review the results.  Testing/Procedures: None  Follow-Up: At [facility], you and your health needs are our priority.  As part of our continuing mission to provide you with exceptional heart care, our providers are all part of one team.  This team includes your primary Cardiologist (physician) and Advanced Practice Providers or APPs (Physician Assistants and Nurse Practitioners) who all work together to provide you with the care you need, when you need it.  Your next appointment:   6 month(s)  Provider:   You may see Jayson Sierras, MD or the following Advanced Practice Provider on your designated Care Team:   Almarie Crate, NP    We recommend signing up for the patient portal called MyChart.  Sign up information is provided on this After Visit Summary.  MyChart is used to connect with patients for Virtual Visits (Telemedicine).  Patients are able to view lab/test results, encounter notes, upcoming appointments, etc.  Non-urgent messages can be sent to your provider as well.   To learn more about what you can do with MyChart, go to [email or web address].   Other Instructions

## 2024-09-07 ENCOUNTER — Telehealth: Payer: Self-pay | Admitting: Cardiology

## 2024-09-07 NOTE — Telephone Encounter (Signed)
 Started amlodipine 2.5 mg on 08/29/24  BP readings are as follows: 143/93, 145/97,134/95,131/95,151/102

## 2024-09-07 NOTE — Telephone Encounter (Signed)
 Left message for patient to call back

## 2024-09-07 NOTE — Telephone Encounter (Signed)
 Pt calling back to f/u about medication problem Please Advise

## 2024-09-07 NOTE — Telephone Encounter (Signed)
 Pt came into office asking to speak with Dr.McDowell or Almarie I advised that Debera is in Clinic and Almarie was not here but I could put in a telephone note.  His Bp medication was changed and he stated his BP has been rising ever since.   Best number [phone]

## 2024-09-08 MED ORDER — AMLODIPINE BESYLATE 5 MG PO TABS: 5.0000 mg | ORAL_TABLET | Freq: Every day | ORAL | 3 refills | Status: AC

## 2024-09-08 NOTE — Telephone Encounter (Signed)
 Patient agrees to increase amlodipine to 5 mg and continue to record bp  Front office will call to schedule nurse apt

## 2024-09-12 DIAGNOSIS — M170 Bilateral primary osteoarthritis of knee: Secondary | ICD-10-CM | POA: Diagnosis not present

## 2024-09-12 DIAGNOSIS — G8929 Other chronic pain: Secondary | ICD-10-CM | POA: Diagnosis not present

## 2024-09-15 DIAGNOSIS — G4730 Sleep apnea, unspecified: Secondary | ICD-10-CM | POA: Diagnosis not present

## 2024-09-26 ENCOUNTER — Encounter: Payer: Self-pay | Admitting: *Deleted

## 2024-09-26 ENCOUNTER — Ambulatory Visit: Attending: Cardiology | Admitting: *Deleted

## 2024-09-26 VITALS — BP 128/84 | HR 64 | Ht 69.0 in | Wt 222.4 lb

## 2024-09-26 DIAGNOSIS — I10 Essential (primary) hypertension: Secondary | ICD-10-CM

## 2024-09-26 NOTE — Patient Instructions (Signed)
 Your physician recommends that you continue on your current medications as directed. Please refer to the Current Medication list given to you today. Your physician recommends that you schedule a follow-up appointment in: as planned. We will contact you after your provider reviews your visit.

## 2024-09-26 NOTE — Progress Notes (Signed)
 Presents for nurse visit per recent phone note request by Almarie Crate, Please increase amlodipine to 5 mg daily and bring him back in for BP check in 2 to 3 weeks. Continue to monitor and log his blood pressure. Reports checking blood pressures at home and has been ranging between 140-160/85/100. Did not bring readings to visit.  Medications reviewed. Reports taking all doses of medications as prescribed without missing doses. Denies chest pain. Reports dizziness since July 2025 after starting olmesartan . Reports SOB all the time that is being evaluated by pulmonology. Vitals done and routed to provider for review.

## 2024-11-07 ENCOUNTER — Ambulatory Visit: Admitting: Internal Medicine

## 2024-11-07 DIAGNOSIS — R0609 Other forms of dyspnea: Secondary | ICD-10-CM

## 2024-11-17 ENCOUNTER — Ambulatory Visit: Admitting: Internal Medicine

## 2024-12-19 ENCOUNTER — Ambulatory Visit: Admitting: Internal Medicine

## 2024-12-19 DIAGNOSIS — R0609 Other forms of dyspnea: Secondary | ICD-10-CM

## 2024-12-19 DIAGNOSIS — J449 Chronic obstructive pulmonary disease, unspecified: Secondary | ICD-10-CM

## 2024-12-19 NOTE — Progress Notes (Unsigned)
 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 Really helping / also bevespi and incruse s benefit 02: none  Rec Stop lisinopril  and olmesartan  40 mg one daily in its place  No other changes for now in medications The key is to stop smoking completely before smoking completely stops you! Please schedule a follow up office visit in 6 weeks, call sooner if needed with all medications /inhalers/ solutions in hand     08/08/2024  f/u ov/[place] office/Brendi Mccarroll re: GOLD 3 copd/doe/cough ? Acei case  maint on trelegy samples   still smoking  but cutting down  did not  bring meds  Chief Complaint  Patient presents with   Shortness of Breath   Dyspnea:  pushing buggy at [employer] occasionally / [employer] parking  Cough: clear daytime> noct or early am  Sleeping: upright in chair s resp cc / does fine on side flat also SABA use: none  02: none  Lung cancer screening: done anyway Aug each year for aorta Patient Instructions  Plan A = Automatic = Always=    Breztri  Take 2 puffs first thing in am and then another 2 puffs about 12 hours later.   Work on inhaler technique: >>>  Remember how golfers warm up by taking practice swings - do this with an empty inhaler  Plan C = Back up -  - only use your albuterol  nebulizer if you've  taken your breztri  w/in 12 hours and it's  not enought > ok to use the nebulizer up to every 4 hours but if start needing it regularly call for immediate appointment   12/19/2024  f/u ov/[place] office/Durand Wittmeyer re: *** maint on ***  No chief complaint on file.   Dyspnea:  *** Cough: *** Sleeping: ***   resp cc  SABA use: *** 02: ***  Lung cancer screening: ***   No obvious day to day or daytime variability or assoc excess/ purulent sputum or mucus plugs or hemoptysis or cp or chest tightness, subjective wheeze or overt sinus or hb symptoms.    Also denies any obvious fluctuation of symptoms with weather or environmental changes or other aggravating or alleviating factors except as outlined above   No unusual exposure hx or h/o childhood pna/ asthma or knowledge of premature birth.  Current Allergies, Complete Past Medical History, Past Surgical History, Family History, and Social History were reviewed in [employer] record.  ROS  The following are not active complaints unless bolded Hoarseness, sore throat, dysphagia, dental problems, itching, sneezing,  nasal congestion or discharge of excess mucus or purulent secretions, ear ache,   fever, chills, sweats, unintended wt loss or wt gain, classically pleuritic or exertional cp,  orthopnea pnd or arm/hand swelling  or leg swelling, presyncope, palpitations, abdominal pain, anorexia, nausea, vomiting, diarrhea  or change in bowel habits or change in bladder habits, change in stools or change in urine, dysuria, hematuria,  rash, arthralgias, visual complaints, headache, numbness, weakness or ataxia or problems with walking or coordination,  change in mood or  memory.         Outpatient Medications Prior to Visit  Medication Sig Dispense Refill   amLODipine  (NORVASC ) 5 MG tablet Take 1 tablet (5 mg total) by mouth daily. 90 tablet 3   aspirin  EC 81 MG tablet Take 1 tablet (81 mg total) by mouth daily.     Coenzyme Q10 (CO Q 10) 100 MG CAPS Take 1 capsule by  mouth daily at 12 noon.     Fluticasone-Umeclidin-Vilant (TRELEGY ELLIPTA) 100-62.5-25 MCG/ACT AEPB Inhale into the lungs. (Patient not taking: Reported on 09/26/2024)     furosemide (LASIX) 20 MG tablet Take 20 mg by mouth daily as needed.     ketoconazole (NIZORAL) 2 % cream Apply 1 Application topically daily.     LORazepam  (ATIVAN ) 1 MG tablet Take 1 mg by mouth every 8 (eight) hours as needed for anxiety.     meloxicam (MOBIC) 7.5 MG tablet Take 7.5 mg by mouth daily.     Multiple Vitamin (MULTIVITAMIN) tablet Take 1 tablet by mouth daily. Adult     nitroGLYCERIN  (NITROSTAT ) 0.4 MG SL tablet Place 1 tablet (0.4 mg total) under the tongue every 5 (five) minutes x 3 doses as needed for chest pain. 25 tablet 3   olmesartan  (BENICAR ) 40 MG tablet Take 1 tablet (40 mg total) by mouth daily. 30 tablet 11   omeprazole (PRILOSEC) 20 MG capsule Take 20 mg by mouth every morning.      potassium chloride (KLOR-CON) 10 MEQ tablet Take 10 mEq by mouth daily as needed (with lasix).     pravastatin  (PRAVACHOL ) 40 MG tablet Take 40 mg by mouth every evening.      No facility-administered medications prior to visit.           Past Medical History:  Diagnosis Date   Anxiety    Arthritis    Chronic pain syndrome    Coronary atherosclerosis of native coronary artery    Multivessel, occluded SVG to RCA 2/14 - managed medically   Depression    Essential hypertension    GERD (gastroesophageal reflux disease)    History of bronchitis 20+ yrs ago   History of hiatal hernia    Joint pain    Mixed hyperlipidemia       Objective:    Wts   12/19/2024        ***   08/08/24 228 lb (103.4 kg)  07/25/24 229 lb (103.9 kg)  06/27/24 227 lb 6.4 oz (103.1 kg)   Vital signs reviewed  12/19/2024  - Note at rest 02 sats  ***% on ***   General appearance:    ***   Mildd barr***         I personally reviewed images and agree with radiology impression as follows:   Chest CTa   07/14/24  No acute  airspace disease, effusion, or pneumothorax. The central airways are patent. There is a 5 mm mean diameter right upper lobe pulmonary nodule      Assessment                   "

## 2025-02-20 ENCOUNTER — Ambulatory Visit: Admitting: Cardiology
